# Patient Record
Sex: Male | Born: 1965 | Race: White | Hispanic: No | Marital: Single | Smoking: Current every day smoker
Health system: Southern US, Community
[De-identification: ages and names within clinical notes are randomized; demographics above are authoritative.]

## PROBLEM LIST (undated history)

## (undated) DIAGNOSIS — I2119 ST elevation (STEMI) myocardial infarction involving other coronary artery of inferior wall: Secondary | ICD-10-CM

## (undated) DIAGNOSIS — E785 Hyperlipidemia, unspecified: Secondary | ICD-10-CM

## (undated) DIAGNOSIS — F1490 Cocaine use, unspecified, uncomplicated: Secondary | ICD-10-CM

## (undated) DIAGNOSIS — I2510 Atherosclerotic heart disease of native coronary artery without angina pectoris: Secondary | ICD-10-CM

## (undated) DIAGNOSIS — F419 Anxiety disorder, unspecified: Secondary | ICD-10-CM

## (undated) DIAGNOSIS — F32A Depression, unspecified: Secondary | ICD-10-CM

## (undated) DIAGNOSIS — K219 Gastro-esophageal reflux disease without esophagitis: Secondary | ICD-10-CM

## (undated) DIAGNOSIS — D649 Anemia, unspecified: Secondary | ICD-10-CM

## (undated) DIAGNOSIS — I10 Essential (primary) hypertension: Secondary | ICD-10-CM

## (undated) DIAGNOSIS — F329 Major depressive disorder, single episode, unspecified: Secondary | ICD-10-CM

## (undated) HISTORY — DX: Anemia, unspecified: D64.9

## (undated) HISTORY — DX: Hyperlipidemia, unspecified: E78.5

## (undated) HISTORY — DX: Major depressive disorder, single episode, unspecified: F32.9

## (undated) HISTORY — PX: NOSE SURGERY: SHX723

## (undated) HISTORY — DX: Depression, unspecified: F32.A

## (undated) HISTORY — DX: Essential (primary) hypertension: I10

## (undated) HISTORY — DX: Anxiety disorder, unspecified: F41.9

## (undated) HISTORY — DX: Gastro-esophageal reflux disease without esophagitis: K21.9

## (undated) HISTORY — DX: Cocaine use, unspecified, uncomplicated: F14.90

## (undated) HISTORY — DX: Atherosclerotic heart disease of native coronary artery without angina pectoris: I25.10

## (undated) HISTORY — PX: CORONARY ANGIOPLASTY: SHX604

---

## 1998-09-18 ENCOUNTER — Emergency Department (HOSPITAL_COMMUNITY): Admission: EM | Admit: 1998-09-18 | Discharge: 1998-09-18 | Payer: Self-pay | Admitting: Emergency Medicine

## 2002-05-06 ENCOUNTER — Emergency Department (HOSPITAL_COMMUNITY): Admission: EM | Admit: 2002-05-06 | Discharge: 2002-05-06 | Payer: Self-pay | Admitting: Emergency Medicine

## 2006-11-26 ENCOUNTER — Inpatient Hospital Stay (HOSPITAL_COMMUNITY): Admission: EM | Admit: 2006-11-26 | Discharge: 2006-11-29 | Payer: Self-pay | Admitting: Cardiology

## 2006-11-26 ENCOUNTER — Ambulatory Visit: Payer: Self-pay | Admitting: Internal Medicine

## 2006-11-26 ENCOUNTER — Encounter: Payer: Self-pay | Admitting: Internal Medicine

## 2006-12-10 ENCOUNTER — Ambulatory Visit: Payer: Self-pay | Admitting: Cardiovascular Disease

## 2006-12-15 ENCOUNTER — Inpatient Hospital Stay (HOSPITAL_COMMUNITY): Admission: EM | Admit: 2006-12-15 | Discharge: 2006-12-16 | Payer: Self-pay | Admitting: Cardiology

## 2006-12-15 ENCOUNTER — Ambulatory Visit: Payer: Self-pay | Admitting: Cardiology

## 2007-12-04 ENCOUNTER — Emergency Department (HOSPITAL_COMMUNITY): Admission: EM | Admit: 2007-12-04 | Discharge: 2007-12-04 | Payer: Self-pay | Admitting: Emergency Medicine

## 2007-12-08 ENCOUNTER — Ambulatory Visit (HOSPITAL_COMMUNITY): Admission: RE | Admit: 2007-12-08 | Discharge: 2007-12-08 | Payer: Self-pay | Admitting: Chiropractic Medicine

## 2008-06-11 ENCOUNTER — Emergency Department (HOSPITAL_COMMUNITY): Admission: EM | Admit: 2008-06-11 | Discharge: 2008-06-12 | Payer: Self-pay | Admitting: Emergency Medicine

## 2008-08-01 ENCOUNTER — Emergency Department (HOSPITAL_COMMUNITY): Admission: EM | Admit: 2008-08-01 | Discharge: 2008-08-01 | Payer: Self-pay | Admitting: Emergency Medicine

## 2008-10-10 ENCOUNTER — Emergency Department (HOSPITAL_BASED_OUTPATIENT_CLINIC_OR_DEPARTMENT_OTHER): Admission: EM | Admit: 2008-10-10 | Discharge: 2008-10-10 | Payer: Self-pay | Admitting: Emergency Medicine

## 2010-08-05 DIAGNOSIS — I2119 ST elevation (STEMI) myocardial infarction involving other coronary artery of inferior wall: Secondary | ICD-10-CM

## 2010-08-05 DIAGNOSIS — I2510 Atherosclerotic heart disease of native coronary artery without angina pectoris: Secondary | ICD-10-CM

## 2010-08-05 HISTORY — DX: Atherosclerotic heart disease of native coronary artery without angina pectoris: I25.10

## 2010-08-05 HISTORY — DX: ST elevation (STEMI) myocardial infarction involving other coronary artery of inferior wall: I21.19

## 2010-11-15 LAB — COMPREHENSIVE METABOLIC PANEL
ALT: 37 U/L (ref 0–53)
AST: 36 U/L (ref 0–37)
Albumin: 4.4 g/dL (ref 3.5–5.2)
Alkaline Phosphatase: 109 U/L (ref 39–117)
BUN: 16 mg/dL (ref 6–23)
GFR calc Af Amer: >60 mL/min (ref >60)
Potassium: 3.9 mEq/L (ref 3.5–5.1)
Sodium: 143 mEq/L (ref 135–145)
Total Protein: 7.5 g/dL (ref 6.0–8.3)

## 2010-11-15 LAB — DIFFERENTIAL
Basophils Relative: 1 % (ref 0–1)
Monocytes Absolute: 0.7 10*3/uL (ref 0.1–1.0)
Monocytes Relative: 7 % (ref 3–12)
Neutro Abs: 7.9 10*3/uL — ABNORMAL HIGH (ref 1.7–7.7)

## 2010-11-15 LAB — CBC
Platelets: 225 10*3/uL (ref 150–400)
RDW: 13.1 % (ref 11.5–15.5)

## 2010-11-15 LAB — ETHANOL: Alcohol, Ethyl (B): <10 mg/dL (ref 0–10)

## 2010-12-18 NOTE — H&P (Signed)
NAME:  Brian Ford, Brian Ford NO.:  [id number]   MEDICAL RECORD NO.:  [id number]          PATIENT TYPE:  INP   LOCATION:  2918                         FACILITY:  [facility]   PHYSICIAN:  Vernice Jefferson, MD          DATE OF BIRTH:  [birth year]   DATE OF ADMISSION:  12/15/2006  DATE OF DISCHARGE:                              HISTORY & PHYSICAL   CHIEF COMPLAINT:  Chest pain.   HISTORY OF PRESENT ILLNESS:  Brian Ford is a 45 year old white man with  a recent ST elevation inferior MI on November 26, 2006, status post bare  metal stent to the right coronary artery with preserved ejection  fraction who presents with a 1-day history of left-sided chest pain.  Brian Ford describes his pain as an uncomfortable ball sensation with  pressure over the left chest as well as some discomfort in the left  anterior shoulder.  His pain started at midnight the night prior to  admission and has been present since that time with only minimal relief  after nitroglycerin administration in the ED.  He denies any associated  nausea, vomiting, diaphoresis, or palpitations.  He has not walked or  exerted himself today so he is unaware if this worsens the pain.  Notably, Brian Ford did smoke crack cocaine around 9 p.m. the night  prior to admission.  He awoke around midnight with this chest pain.  The  pain in its nature is qualitatively similar to the pain he had when he  suffered from his MI.  However, the current pain is somewhat less in  intensity.  He is very concerned about the possibility that this may be  related to his heart.   PAST MEDICAL HISTORY:  1. Coronary artery disease    Status post ST elevation MI, inferior on November 26, 2006, status post  bare metal stent placed to the right coronary artery.    Some irregularities in the left anterior descending artery, 40%  obstruction in the marginal    Inferobasal wall hypo/akinesis with an EF of 55-60% with normal right  ventricular function.  1.  Hyperlipidemia  2. Crack cocaine use for greater than 20 years  3. Tobacco abuse ongoing  4. History of mild normocytic anemia.   MEDICATIONS:  1. Plavix 75 mg p.o. daily.  2. Aspirin daily.  3. Nicotine patch.  4. Wellbutrin XL 150 mg daily.  5. Nitroglycerin as needed for chest pain.  6. Simvastatin 80 mg daily.   SOCIAL HISTORY:  Greater than 25-pack-year tobacco use history, ongoing.  Crack cocaine use, greater than 20 years.  The patient is currently  unemployed and lives with his mother near [place].   FAMILY HISTORY:  His father died at unknown age in motor vehicle  accident.  His mother is alive with no history of coronary artery  disease.  He did have one uncle with MI in his 80s.   REVIEW OF SYSTEMS:  GENERAL:  The patient denies any recent illness,  sick contacts, fevers, chills, diaphoresis.  CARDIOVASCULAR:  He does  have left-sided chest  pain and discomfort with radiation to the left  shoulder.  He denies any arm pain, numbness, any jaw pain or numbness.  He denies any known palpitations, heart racing, syncope, dyspnea on  exertion, or shortness of breath.  PULMONARY:  He denies any cough,  shortness of breath, back pain.  He denies any joint pain or swelling.  He denies any peripheral lower extremity edema.  NEUROLOGY:  He denies  any focal areas of weakness, numbness, tingling, imbalance, or  discoordination, or difficulty with speech.   PHYSICAL EXAMINATION:  VITAL SIGNS:  Temperature 97.9, heart rate 74,  blood pressure 107/55, respiratory rate 15, O2 saturations are 94-96% on  room air.  GENERAL:  This is a normal appearing white man in no acute distress.  HEENT:  Eyes; pupils equal, round, and reactive to light.  Extraocular  muscles intact.  Oropharynx revealed no erythema and no exudate.  NECK:  Supple with no JVD and no thyromegaly.  CARDIOVASCULAR:  Regular rate and rhythm with no murmurs, rubs, or  gallops appreciated.  LUNGS:  Clear to auscultation  bilaterally with no wheezes, rhonchi, or  rales.  ABDOMEN:  Active bowel sounds, soft, nontender, and nondistended.  EXTREMITIES:  No cyanosis, clubbing, or edema.  NEUROLOGY:  He is alert and oriented x3 with no focal deficits in  strength or sensation.  He is somewhat anxious.   ADMISSION LABS:  White blood cell count 12.4, hemoglobin 15.1, platelets  432, MCV 83, ANC 8.9, sodium 137, potassium 3.5, chloride 104, bicarb  28, BUN 13, creatinine 1.03, glucose 101.  Point of care cardiac markers  were negative with a normal CK-MB of 3.5, troponin less than 0.05,  myoglobin 65.   Chest x-ray revealed a heart that is normal in size with interstitial  changes consistent with COPD, however, there is no active  cardiopulmonary disease.  Admission EKG from outside hospital reveals a  normal sinus rhythm at a rate of 79, left deviation axis, no signs of  ventricular hypertrophy, normal intervals notable for distinct prominent  Q waves in 2, 3, and aVF that were present on his prior EKG from April  23 and April 24.  He does have some mild persistent elevation in 2, 3,  and aVF which were present in comparison to April 23.  He has T wave  inversions in 2, 3, and aVF which again are present in comparison to  April 24.  It looks like the T wave inversions evolved in lead 2 between  April 23 and April 24.  No Q's in the anterolateral chest leads with  upright T's in lateral and anterolateral leads.   ASSESSMENT:  Brian Ford is a 45 year old white man with a history of  known coronary artery disease status post recent inferior MI on November 26, 2006, who is admitted to outside hospital with 1-day history of left-  sided chest pain with radiation to the left anterior shoulder.   Problem 1.  Chest pain.  Certainly given the patient's history of  coronary artery disease this is concerning for recurrent or ongoing  ischemia.  Notably the patient has been using crack cocaine which  certainly could have  induced vasospasm.  Regardless, we will plan to  admit the patient for at least 24-hour monitoring on telemetry.  We will  also cycle his cardiac markers and repeat and EKG in the morning.  Certainly, should any of his enzymes bump, we will need to reevaluate  him for potential acute  coronary syndrome.  In the interim, we will  continue to treat him with aspirin and Plavix as well as heparin.  Currently he is on nitroglycerin drip that he was transferred from  outside hospital with.  We will anticipate discontinuing this and  treating his chest pain with morphine as well as sublingual  nitroglycerin.  In terms of his substance abuse, I will obtain a UDS so  this can be documented objectively.  In all likelihood, given his normal  set of cardiac enzymes and his recent cocaine use, this is probably  related to vasospasm.  However, should he bump his cardiac enzymes,  certainly I would be concerned for any form of vessel stenosis and/or  stent occlusion.  We will continue to follow him closely overnight.  Clinically he is quite stable currently.  He reports that he is  continuing to take his Plavix and has not missed any days of this which  is reassuring in terms of the patency of his stent.   Problem 2.  Tobacco abuse.  The patient was counseled in regards to this  and certainly encouraged to quit as this is directly related to coronary  artery disease and risk for adverse events.   Problem 3.  Crack cocaine use.  Again I will obtain a UDS to document  this objectively and the patient surprisingly was unaware that there was  a direct relation between crack cocaine use and heart disease.  We  talked to him at length about this tonight.  Hopefully this admission as  well as a previous MI will be a motivation for him to stop completely.  At discharge, he will certainly need referral to narcotics [facility] in  attempt to help him quit.   Problem 4.  Hyperlipidemia.  We will continue his  statin as well as  aspirin.      Antony Contras, M.D.  Electronically Signed      Vernice Jefferson, MD  Electronically Signed    GL/MEDQ  D:  12/15/2006  T:  12/15/2006  Job:  [id number]   cc:   Noralyn Pick. Eden Emms, MD, [facility]  Bruce R. Juanda Chance, MD, [facility]

## 2010-12-18 NOTE — Assessment & Plan Note (Signed)
[facility]                       [place] [facility] OFFICE NOTE   ALDWIN, MICALIZZI                       MRN:          [id number]  DATE:12/10/2006                            DOB:          Sep 10, 1965    [name] is seen today as a new patient by me.  He was in the hospital  from November 26, 2006 to November 29, 2006.  He had an inferior wall MI and  was transferred from [facility].  He was intervened upon by Dr. Charlies Constable for total occlusion of the right coronary artery.  The left  system did not have critical disease.  His EF was preserved at 55%.  He  got a multi-link Vision bare metal stent, mid RCA.   In talking to Treyshawn, he has felt okay since discharge.  He had not  worked for 2 years prior to this.  He has a job interview later this  week.  He has not had any recurrent chest pain, PND or orthopnea.  There  have been no palpitations or syncope.   Unfortunately, he continues to smoke.  He has also had marijuana and  cocaine in the past year.   He had nicotine patches at home.  He still has 14 mg patches.  I  explained to him that if he continued to smoke, he would have another  heart attack.  Initially, the Wellbutrin that he was on made him feel a  little sick; however, he has been tolerant to it.  I counseled him about  his smoking for less than 10 minutes.  He is going to try the Wellbutrin  and try his nicotine patch again.  I will see him back in 8 weeks.  If  he has been unsuccessful with quitting, then we will try to switch him  to Chantix.   REVIEW OF SYSTEMS:  Otherwise negative.   CURRENT MEDICATIONS:  (Per his discharge summary):  1. Plavix 75 a day.  2. Aspirin daily.  3. Nicotine patches.  4. Wellbutrin XL 150 a day.  5. Nitroglycerin p.r.n.  6. Simvastatin 80 mg at night.   PHYSICAL EXAMINATION:  GENERAL:  A middle-aged white male in no  distress.  Mood is appropriate.  VITAL SIGNS:  Weight is 153 pounds.  Blood  pressure is 110/60, pulse 80  and regular.  He is 5 feet, 8 inches.  He is afebrile.  HEENT:  Normal.  NECK:  Remarkable for no JVP elevation, no carotid bruits, and no  thyromegaly.  LUNGS:  Clear.  There is normal diaphragmatic motion.  There is no  wheezing.  CARDIAC:  Normal.  There is no murmur, rub, gallop or click.  PMI is  normal.  ABDOMEN:  Benign.  Bowel sounds are positive.  There is no tenderness.  There is no AAA.  There is no hepatosplenomegaly.  There is no  hepatojugular reflux.  Cath site is well healed with no evidence of  fistula or pseudoaneurysms.  There is no ecchymosis.  EXTREMITIES:  Distal pulses are intact with no edema.  PTs were +2  bilaterally.  There is no lymphadenopathy.  NEUROLOGIC:  Nonfocal.  There is no muscular weakness.   IMPRESSION:  Stable status post inferior wall myocardial infarction with  preserved left ventricular function.  No evidence of RV involvement, and  no recurrent symptoms since discharge.  In regards to his risk factor  modification, he desperately needs to stop smoking.  He will continue  his Wellbutrin and nicotine patches.  I will see him back in 8 weeks to  see how he is doing.   He will continue his current dose of statin drug.  We will recheck a  lipid and liver profile in 3 months.   I encouraged him to continue to follow up with his job interviews.  He  is an Personnel officer by trade.  From a cardiac perspective, this would  actually probably help him.   He lives at home with his mother.  She smokes.  I told him that there  would be no way for him to successfully quit unless she did not smoke  around him or quit with him.  He will talk to her about this.   Overall, his hemodynamics are fine.  He has nitroglycerin at home in  case he needs it.   I will see him back in about 8 weeks to reassess his nicotine  dependence.     Noralyn Pick. Eden Emms, MD, [facility]  Electronically Signed    PCN/MedQ  DD: 12/10/2006  DT:  12/10/2006  Job #: [id number]

## 2010-12-18 NOTE — Discharge Summary (Signed)
NAME:  Brian Ford, Brian Ford NO.:  [id number]   MEDICAL RECORD NO.:  [id number]          PATIENT TYPE:  INP   LOCATION:  2918                         FACILITY:  [facility]   PHYSICIAN:  Maisie Fus C. Wall, MD, FACCDATE OF BIRTH:  1965/12/19   DATE OF ADMISSION:  12/15/2006  DATE OF DISCHARGE:  12/16/2006                               DISCHARGE SUMMARY   DISCHARGE DIAGNOSES:  1. Chest pain, likely cocaine-induced vasospasm, normal cardiac      enzymes and unchanged EKG.  2. Coronary artery disease.      a.     Status post ST elevation myocardial infarction on       April23,2008.      b.     Status post cardiac catheterization with a bare-metal stent       placed in right coronary artery.      c.     Irregularities in the left anterior descending artery, 40%       obstruction in the marginal.      d.     Inferobasal wall hypo to akinesis with an ejection fraction       of 55% to 60% with normal right ventricular function.  3. Hyperlipidemia.  4. Crack cocaine use for greater than 20 years.  5. Tobacco abuse, ongoing.  6. History of a mild normocytic anemia.   DISCHARGE DIAGNOSES:  1. Plavix 75 mg p.o. daily.  2. Aspirin 81 mg p.o. daily.  3. Nicotine patch.  4. Wellbutrin XL 150 mg p.o. daily.  5. Nitroglycerin 0.4 mg sublingual q.5 minutes p.r.n. chest pain.  6. Simvastatin 80 mg p.o. daily.   DISPOSITION/FOLLOWUP:  Brian Ford left the hospital against medical  advice this morning.  The patient became quite upset that we urine drug  screened him and became quite agitated, pulled out his IV as well as  telemetry pads.  There has been no change in his medication regimen and  he has been instructed to continue Plavix, aspirin and statin.  Ideally,  at some point he will follow-up at Dr. Fabio Bering office.  Otherwise, the  risks of leaving against medical advice were explained to the patient.  He is fully understanding of this, and signed an AMA form.   PROCEDURE PERFORMED:   None.   CONSULTATIONS:  None.   BRIEF ADMISSION HISTORY AND PHYSICAL:  Brian Ford is a 45 year old white  man with a recent ST elevation inferior MI in late April2008 who had  a bare-metal stent placed in his right coronary artery, who presents  with 1-day history of left-sided chest pain.  Apparently the patient was  sleeping when he awoke around midnight the night prior to arrival with  pain described as an uncomfortable ball sensation with pressure over  the left chest, as well as some discomfort in left anterior shoulder.  He has only had minimal relief with nitroglycerin administration in ED.  He denies any associated nausea, diaphoresis or palpitations.  He has  not exerted himself, so he is not sure if this makes the pain worse or  not.  Notably, the  patient had smoked crack-cocaine around 9:00 p.m. the  night prior to admission.  He says the pain is qualitatively similar in  nature to his prior MI, however, it is less so in intensity.   PHYSICAL EXAM:  On admission his temperature was 97.9, heart rate 74,  blood pressure 107/55, respiratory rate 15, O2 sats were 94% to 96% on  room air.  GENERAL:  This is a normal-appearing white man in no apparent distress.  EYES:  Pupils equally round, reactive to light.  Extraocular muscles  intact.  OROPHARYNX:  Revealed no erythema.  No exudate.  NECK:  Supple.  No JVD.  No thyromegaly.  CARDIOVASCULAR:  He had a regular rate and rhythm with no murmurs, rubs  or gallops.  PULMONARY:  Clear to auscultation bilaterally.  No wheeze, rhonchi or  rales.  ABDOMEN:  Active bowel sounds, soft, nontender, nondistended.  EXTREMITIES:  No cyanosis, clubbing, edema.  NEURO:  He is alert, oriented x3.  No focal deficits in strength or  sensation.  He is somewhat anxious.   White blood cell count 12.4, hemoglobin 15.1, platelets 432,  MCV of 83,  ANC of 8.9, sodium 137, potassium 3.5, chloride 104, bicarb 28, BUN 13,  creatinine 1.03 and glucose  101.  Point of care cardiac markers were  negative.  Second set was negative as well.  His EKG was reviewed and revealed no significant changes compared to his  prior.  No changes concerning for any acute ischemia.  He does have an  evolving inferior MI with still some concave up ST waves in II, III,  aVF; Qs II, III, AVF.  Admission chest x-ray revealed no active disease.   HOSPITAL COURSE:  1. Chest pain.  The patient was admitted to the CCU step-down unit and      started on IV heparin, and continued Plavix as well as a statin.      He had been transferred from an outside hospital on a nitroglycerin      drip which was titrated off.  His chest pain was treated with IV      morphine.  His cardiac enzymes remained normal at his morning EKG      also remained unchanged.  Again, as mentioned previously, the      patient did endorse recent cocaine use.  His urine drug screen was      positive for cocaine as well as opiates.  The patient became very      frustrated and angered at [place] for urine drug screening him.  I      explained to him that this was just needed for objective      documentation in terms of documenting a possible medical etiology      of his chest pain.  At this point the patient became quite      frustrated.  I witnessed him curse-out a nurse and begin pulling      out his IV and pads.  The risks/benefits of a beta-blocker or      calcium channel-blocker were discussed on morning rounds.  Given      his cocaine use, beta-blocker was essentially ruled out as a      possibility including the theoretical benefits of carvedilol.  In      addition the patient's blood pressure was somewhat soft with      systolics in the mid to high mid-90s, so we actually held off on      any  calcium channel-blocker which theoretically could help with      cocaine-induced vasospasm.  The patient left AMA after having all      the risks explained to him. 2. Polysubstance abuse.  The patient  notably says that he has used      crack-cocaine for close to 2 decades.  The patient was legitimately      surprised to learn that there is a link between cocaine use and      cardiovascular disease.  He reports that he was unaware of this.      We explained in detail to him during his first night of admission      in regards of the risks of serous adverse events including death      from cardiac illness as well as intracerebral processes related to      crack-cocaine use.  He slowly seemed to be taking this in.  In the      future, the patient should certainly warrant referral to [facility]      [facility], or any other outpatient drug rehabilitation or      therapeutic counseling that can be obtained for him.  In addition,      the patient continued to be counseled on smoking cessation.  3. Hyperlipidemia.  The patient was maintained on statin therapy.   DISCHARGE LABS AND VITALS ON THE DAY OF DISCHARGE:  His temperature is  97.9, heart rate of 63, blood pressure of 87/53, respiratory rate of 15,  O2 sats were 96% on room air.  CK 92, CK-MB of 3.6, troponin 0.06.  Urine drug screen positive for cocaine and opiates.      Antony Contras, M.D.  Electronically Signed      Jesse Sans. Daleen Squibb, MD, [facility]  Electronically Signed    GL/MEDQ  D:  12/16/2006  T:  12/16/2006  Job:  [id number]   cc:   Noralyn Pick. Eden Emms, MD, [facility]  Gerrit Friends. Dietrich Pates, MD, [facility]

## 2010-12-21 NOTE — H&P (Signed)
NAME:  Brian Ford, Brian Ford NO.:  [id number]   MEDICAL RECORD NO.:  [id number]          PATIENT TYPE:  OIB   LOCATION:  2861                         FACILITY:  [facility]   PHYSICIAN:  Bruce R. Juanda Chance, MD, FACCDATE OF BIRTH:  May 03, 1966   DATE OF ADMISSION:  11/26/2006  DATE OF DISCHARGE:                              HISTORY & PHYSICAL   PRIMARY CARE PHYSICIAN:  None.   PRIMARY CARDIOLOGIST:  New, currently Dr. Juanda Chance.   CHIEF COMPLAINT:  Chest pain.   HISTORY OF PRESENT ILLNESS:  Brian Ford is a 45 year old male with no  previous history of coronary artery disease.  He was in his usual state  of health yesterday and developed chest pain approximately 4 p.m.  It  was in the middle of his chest.  It was associated with nausea,  diaphoresis, and some shortness of breath.  He tried Advil with partial  relief.  He states that the Advil relieved the pain to the point where  he was able to sleep, but it woke him up at approximately 4 a.m.  It  eased off slightly on its own, but when he woke up again at 7 o'clock  and started moving around, it got worse, and he went to [facility]  Emergency Room.  There, he was evaluated, and the pain was at that time  in his chest and radiating into his left arm, shoulder, and back.  It  was described as dull and a 10/10.  His EKG was concerning for acute  inferior MI, and he was transferred urgently by EMS [facility] to the  cath lab.   On arrival to the cath lab, Brian Ford is complaining of a decreased  level of pain but still calls it a 5/10.  He is currently denying nausea  or shortness of breath and is not diaphoretic.  He states he has never  had any episode of pain like this before.  He denies any other symptoms.   PAST MEDICAL HISTORY:  He denies any history of diabetes, hypertension,  hyperlipidemia, or family history of coronary artery disease.  He admits  to ongoing tobacco use but denies drug or alcohol use.  He has not had  any recent medical care.   SURGICAL HISTORY:  None.   ALLERGIES:  No known drug allergies.   MEDICATIONS:  He takes no prescription drugs and takes Advil p.r.n.   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  He has approximately a 30-pack-  year history of tobacco use but denies alcohol or drug abuse.   FAMILY HISTORY:  His mother is alive at age 62 with no history of  coronary artery disease.  His father died when the patient was 38 years  old, probably in his 30s but no other information is available.  He has  no siblings with coronary artery disease.   REVIEW OF SYSTEMS:  He denies any recent fevers or chills.  He has  occasional arthralgias.  He denies hematemesis, hemoptysis, or melena.  Review of  systems is otherwise negative.   PHYSICAL EXAMINATION:  VITAL SIGNS:  Temperature is 98.3.  Current blood  pressure 106/69, heart rate 65, respiratory rate 24, O2 saturation 100%  on 2 liters.  GENERAL:  He is a well-developed, well-nourished white male in moderate  distress.  HEENT:  His head is normocephalic and atraumatic with extraocular  movements intact.  Sclerae clear.  Nares without discharge.  Oropharynx  without erythema.  NECK:  There is no lymphadenopathy, thyromegaly, bruit, or JVD noted.  CV:  His heart is regular in rate and rhythm with an S1, S2.  No  significant murmur, rub, or gallop is noted.  Distal pulses are 2+ in  all 4 extremities and no femoral bruits are appreciated.  LUNGS:  Essentially clear to auscultation bilaterally.  SKIN:  No rashes or lesions are noted.  ABDOMEN:  Fairly firm but nontender and has active bowel sounds.  EXTREMITIES:  There is no cyanosis, clubbing, or edema noted.  MUSCULOSKELETAL:  He has got no joint deformity or effusions.  NEURO:  He is alert and oriented.  His cranial nerves II-XII grossly  intact.   DATA:  Chest x-ray performed at [facility] is pending.    Laboratory values performed at [facility] show a sodium 135, potassium  3.5, chloride 97, CO2 25, BUN 14, creatinine 1.1, glucose 119,  hemoglobin 14, hematocrit 40.9, WBCs 18.6, platelets 354,000.  EKG is  sinus bradycardia rate 58 beats per minute with ST elevation  approximately 1 mm in the inferior leads.   IMPRESSION:  Acute inferior myocardial infarction:  Brian Ford is being  taken urgently to the cath lab and further evaluation and treatment will  depend on the results of the heart catheterization.  He is being given  aspirin and anticoagulated.  He will be started on empiric Lipitor 80  and a lipid profile will be checked.  He is being anticoagulated with  Plavix as well.  Will start a proton pump inhibitor for possible reflux  symptoms.  We will check a hemoglobin A1c and TSH.      Theodore Demark, PA-C      Bruce R. Juanda Chance, MD, [facility]  Electronically Signed    RB/MEDQ  D:  11/26/2006  T:  11/26/2006  Job:  [id number]

## 2010-12-21 NOTE — Cardiovascular Report (Signed)
NAME:  Brian Ford, Brian Ford NO.:  [id number]   MEDICAL RECORD NO.:  [id number]          PATIENT TYPE:  INP   LOCATION:  2807                         FACILITY:  [facility]   PHYSICIAN:  Bruce R. Juanda Chance, MD, FACCDATE OF BIRTH:  09/09/1965   DATE OF PROCEDURE:  11/26/2006  DATE OF DISCHARGE:                            CARDIAC CATHETERIZATION   PROCEDURE:  Cardiac catheterization and percutaneous coronary  intervention.   CLINICAL HISTORY:  Brian Ford is 45 years old and works as an  Personnel officer.  He has had no prior medical history and he has no primary  care physician.  Yesterday afternoon about 5 he developed chest pain  which was intermittent through the night and became more persistent this  morning, and he came to [facility] emergency room at 8:25 a.m.  His ECG  showed an acute diaphragmatic wall infarction and code STEMI was called  and [facility] brought him to [facility], where he was taken directly  to the catheterization laboratory for intervention.   PROCEDURE:  The procedure was performed via the right femoral artery  using an arterial sheath and 6-French preformed coronary catheters.  A  front wall arterial puncture was performed and Omnipaque contrast was  used.  After we recognized that the patient had total occlusion of the  mid right coronary artery, preparation was made for PCI.  The patient  was given Angiomax bolus and infusion and was given 600 mg of Plavix and  four chewable aspirin.  We crossed the lesion with a Prowater wire  without difficulty, but this did not reestablish flow.  We dilated the  lesion with a 2.25 x 20 mm balloon, but this also did not reestablish  flow.  We then inflated the balloon at about 3 atmospheres for 15  seconds, and this did establish TIMI-2 flow in the distal vessel.  The  patient became hypotensive and bradycardic and required dopamine and IV  fluids.  We then stented the lesion with a 3.0 x 23-mm Vision stent,  deploying this with one inflation of 15 atmospheres for 30 seconds.  We  then postdilated with a 3.5 x 15-mm Quantum Maverick, performing three  inflations up to 18 atmospheres for 30 seconds.  We felt that the stent  was still not completely expanded to the size of the vessel, so we then  went in with a 4.0 x 15-mm Quantum Maverick and performed three  inflations up to 8 atmospheres for 30 seconds trying to catch both edges  of the stent.  Final diagnostic studies were then performed through the  guiding catheter.   The right femoral was closed with an Angio-Seal at the end of the  procedure.  The patient tolerated the procedure well and left the  laboratory in satisfactory position.   RESULTS:  Left main coronary:  The left main coronary artery was free of  significant disease.   Left anterior descending artery:  The left anterior descending artery  gave rise to two diagonal branches and three septal perforators.  The  LAD was irregular but there was no major obstruction.  The circumflex artery:  The circumflex artery gave rise to a first  marginal branch, a second marginal branch and three posterolateral  branches.  There was 40% narrowing in the first marginal branch.   Right coronary artery:  The right coronary artery is a moderately large  vessel that gave rise to a conus branch, a right ventricular branch, and  then was completely occluded at its midportion.  There were faint  collaterals filling the posterior descending branch from the left  coronary artery.   Left ventriculogram:  The left ventriculogram performed in the RAO  projection showed hypokinesis to akinesis of the inferobasal wall.  The  rest of the wall motion was good and thee estimated fraction was 55%.   Following stenting of the lesion in the mid-right coronary artery, the  stenosis improved from 100% to 0% in the flow improved from TIMI-0 to  TIMI-3 flow.   The patient had the onset of chest pain at 5  p.m. on April 22.  He  arrived at the [facility] emergency room at 8:25 on April 23.  First he  arrived at [facility] catheterization lab at 10:07 and the first balloon  inflation was at 10:32.  This gave a door to balloon time from [facility] of 2 hours 7 minutes and a reperfusion time of 17 hours 32  minutes.   CONCLUSION:  1. Acute diaphragmatic wall infarction with total occlusion of the mid-      right coronary artery, irregularities in the left anterior      descending artery, 40% narrowing in the marginal branch of the      circumflex artery and inferobasal wall hypokinesis to akinesis with      an estimated fraction of 55%.  2. Successful reperfusion and stenting of the lesion in the mid-right      coronary with improvement of sentinel narrowing from 100 to 0% and      improvement in the flow from TIMI-0 to TIMI-3 flow using a bare      metal stent.   DISPOSITION:  The patient was returned to the post-angiography unit for  further observation.  Will plan on discharge on day #2 if the patient  remains stable.  Will recommend Plavix for a year.      Bruce Elvera Lennox Juanda Chance, MD, [facility]  Electronically Signed     BRB/MEDQ  D:  11/26/2006  T:  11/26/2006  Job:  [id number]   cc:   Cardiopulmonary Lab

## 2010-12-21 NOTE — Discharge Summary (Signed)
NAME:  Brian Ford, STROLE NO.:  [id number]   MEDICAL RECORD NO.:  [id number]          PATIENT TYPE:  INP   LOCATION:  4704                         FACILITY:  [facility]   PHYSICIAN:  Peter C. Eden Emms, MD, FACCDATE OF BIRTH:  [birth year]   DATE OF ADMISSION:  11/26/2006  DATE OF DISCHARGE:  11/29/2006                               DISCHARGE SUMMARY   CARDIOLOGIST:  He will be new to our [place] office.  His attending  this admission was Dr. Charlies Constable.   PRIMARY CARE PHYSICIAN:  None   REASON FOR ADMISSION:  Chest pain and electrocardiogram evidence of  acute inferior myocardial infarction.   DISCHARGE DIAGNOSES:  1. Coronary artery disease.      a.     Status post acute inferior ST elevation myocardial       infarction this admission with late presentation, treated with a       bare-metal stent to the right coronary artery.      b.     Persistent hypotension post intervention despite good left       ventricular and right ventricular function - resolved prior to       discharge.  2. Preserved left ventricular function with an ejection fraction of      60% and hypokinesis of the inferior septal wall on echocardiogram.      Right ventricular function appeared to be normal.  3. Dyslipidemia.  Therapy initiated this admission.  4. Tobacco abuse.  5. Mild normocytic anemia.   PROCEDURE THIS ADMISSION:  Cardiac catheterization and percutaneous  coronary intervention by Dr. Charlies Constable November 26, 2006.  Please see  the dictated note for complete details.  Briefly, LAD irregular,  circumflex 40% at the obtuse marginal, RCA totally occluded mid vessel,  LV inferobasal hypokinesis to akinesis, EF 55%.  The patient received a  Multi-Link Vision bare-metal stent to the mid RCA reducing stenosis from  100% to 0%.   HISTORY:  [name] is a 45 year old male patient with no previous  coronary disease who presented to the hospital on the day of admission  with complaints of  chest discomfort that developed approximately 4:00  p.m. the day prior to admission.  He eventually went to [facility]  emergency room with 10/10 chest pain.  His EKG was concerning for  inferior myocardial infarction, and he was transferred directly to [facility] cardiac catheterization lab for further evaluation and  treatment.   HOSPITAL COURSE:  As noted above, the patient went directly to the  cardiac catheterization lab.  He was seen by Dr. Juanda Chance.  He underwent  emergent cardiac catheterization and percutaneous coronary intervention  of the RCA.  He did tolerate the procedure well.  However, post  intervention, he developed hypotension that was treated with dopamine as  well as fluid boluses.  His blood pressure remained somewhat tenuous  over the next 48 hours.  His dopamine was eventually weaned off.  He did  have some accelerated idioventricular rhythm.  He had a relook  echocardiogram to make sure he had no RV involvement  with his infarct.  As noted above, his RV function remained normal.  His blood pressure had  finally recovered by November 28, 2006.  It was recommended that he be  placed on Wellbutrin as well as nicotine patch to help him quit smoking.  He was placed on high-dose statin therapy.  No beta-blocker or ACE  inhibitor was initiated given his hypotension.  The patient does have  difficulty with finances.  The Child psychotherapist as well as Freight forwarder have seen the patient.  He will be set up with Plavix  assistance.  He will receive some Plavix from the pharmacy in the  hospital as well as a couple prescriptions for 7 days of free Plavix.  He will also be set up with Lipitor.  At discharge, we have gone ahead  and given him a prescription for simvastatin 80 mg every night to take  the place of his Lipitor, as he can get this for $4 per month at [facility].  This has been explained to the patient.  As soon as his  assistance for Lipitor comes through  and he gets that medication, he can  switch back to the Lipitor.  He will be provided with a nicotine patch  prescription as well.  On the morning of November 29, 2006, he was found to  be in stable condition and ready for discharge to home.   LABORATORY AND ANCILLARY DATA:  At discharge, white count 10,600,  hemoglobin 0.5, hematocrit 34.3, MCV 84.5, platelet count 235,000.  INR  on admission 1.0.  April 25, sodium 140, potassium 3.4, glucose 89, BUN  10, creatinine 0.98, calcium 8.1, total bilirubin 0.7, alkaline  phosphatase 75, AST 260, ALT 75, total protein 5.7, albumin 3.1.  Peak  troponin greater than 100, peak CK-MB was 568.2.  Total cholesterol 179,  triglycerides 121, HDL 38, LDL 117.  TSH 1.3.  Admission chest x-ray:  No acute chest disease.   DISCHARGE MEDICATIONS:  1. Plavix 75 mg daily.  2. Aspirin 325 mg daily.  3. Nicotine patches as directed.  4. Wellbutrin XL 150 mg daily.  5. Nitroglycerin p.r.n. chest pain.  6. Simvastatin 80 mg every night.  As noted above, the patient has      been advised to substitute simvastatin as he should be able to      afford this until the assistance for Lipitor comes through.   DIET:  Low-fat, low-sodium diet.   ACTIVITY:  The patient is to increase his activity slowly.  He should  not return to work until he is seen back in follow up in our office in  [place].  No driving, lifting, exertion or sexual activity for 2  weeks.  He may shower.  He may walk up steps.   WOUND CARE:  He should call our office for any groin swelling, bleeding,  bruising or fever.   FOLLOW UP:  The patient will be set up with follow-up in the [place]  office with one of our physicians there in the next 2 weeks, and the  office will contact with an appointment.  As noted above, he is awaiting  prescriptions from the pharmacy as well as prescription assistance from  the drug companies.   Total physician/P.A. time greater than 30 minutes.      Tereso Newcomer, PA-C      Peter C. Eden Emms, MD, [facility]  Electronically Signed    SW/MEDQ  D:  11/29/2006  T:  11/29/2006  Job:  [phone]

## 2010-12-25 ENCOUNTER — Ambulatory Visit (INDEPENDENT_AMBULATORY_CARE_PROVIDER_SITE_OTHER): Payer: Self-pay | Admitting: Adult Health

## 2010-12-25 ENCOUNTER — Encounter: Payer: Self-pay | Admitting: Adult Health

## 2010-12-25 DIAGNOSIS — E785 Hyperlipidemia, unspecified: Secondary | ICD-10-CM

## 2010-12-25 DIAGNOSIS — I519 Heart disease, unspecified: Secondary | ICD-10-CM

## 2010-12-25 DIAGNOSIS — Z9861 Coronary angioplasty status: Secondary | ICD-10-CM | POA: Insufficient documentation

## 2010-12-25 DIAGNOSIS — I2510 Atherosclerotic heart disease of native coronary artery without angina pectoris: Secondary | ICD-10-CM

## 2010-12-25 DIAGNOSIS — E7800 Pure hypercholesterolemia, unspecified: Secondary | ICD-10-CM

## 2010-12-25 MED ORDER — NITROGLYCERIN 0.4 MG SL SUBL: 0.4000 mg | SUBLINGUAL_TABLET | SUBLINGUAL | Status: DC | PRN

## 2010-12-25 MED ORDER — ASPIRIN EC 81 MG PO TBEC: 81.0000 mg | DELAYED_RELEASE_TABLET | Freq: Every day | ORAL | Status: AC

## 2010-12-25 NOTE — Patient Instructions (Signed)
**Note De-Identified  Obfuscation** Your physician has recommended you make the following change in your medication: start taking Aspirin 81mg  daily  Your physician recommends that you return for lab work in: this week  Please see handout given to you today on a low cholesterol diet  Your physician has requested that you have en exercise stress myoview. For further information please visit [email or web address] Please follow instruction sheet, as given.  Your physician recommends that you schedule a follow-up appointment in:  after tests.

## 2010-12-25 NOTE — Progress Notes (Signed)
HPI: Brian Ford is a 45 y/o male patient of Dr. Eden Emms with known history of CAD with history of Inferior wall MI, he has a BMS to the RCA. He has a history of tobacco abuse and hypercholesterolemia. He has not been seen in the office since May of 2008.He states that he has no insurance and has been seen by the [facility]. Found to have elevated cholesterol and placed on Lipitor,  He was advised to follow-up with cardiology with CAD history.  He unfortunately continues to smoke.  He denies chest pain, but has noticed increased shortness of breath and fatigue. He cannot do anything strenuous (working in the yard) without having to stop after about 30 minutes.  He has not been diagnosed with COPD. He states that he never had chest pain with his inferior MI, just shortness of breath.   He has had no hospitalizations or ER visits since being seen last in 2008.   Allergies not on file  Current Outpatient Prescriptions  Medication Sig Dispense Refill  . aspirin 81 MG EC tablet Take 81 mg by mouth daily.        Marland Kitchen buPROPion (WELLBUTRIN XL) 150 MG 24 hr tablet Take 150 mg by mouth daily.        . clopidogrel (PLAVIX) 75 MG tablet Take 75 mg by mouth daily.        . nicotine (NICODERM CQ - DOSED IN MG/24 HOURS) 14 mg/24hr patch Place 1 patch onto the skin daily.        . nitroGLYCERIN (NITROSTAT) 0.4 MG SL tablet Place 0.4 mg under the tongue every 5 (five) minutes as needed.        . simvastatin (ZOCOR) 80 MG tablet Take 80 mg by mouth at bedtime.          Past Medical History  Diagnosis Date  . Chest pain   . CAD (coronary artery disease)   . Coronary stent occlusion   . Hyperlipidemia   . Crack cocaine use   . Tobacco abuse   . Normocytic anemia     Past Surgical History  Procedure Date  . Coronary angioplasty     ROS: Review of systems complete and found to be negative unless listed above PHYSICAL EXAM  BP  124/82,  P-77, R-20 General: Well developed, well nourished, in no acute  distress Head: Eyes PERRLA, No xanthomas.   Normal cephalic and atramatic  Lungs: Mild bilateral crackles, occasional wheezes, but scant. Heart: HRRR S1 S2, Pulses are 2+ & equal.            No carotid bruit. No JVD.  No abdominal bruits. No femoral bruits. Abdomen: Bowel sounds are positive, abdomen soft and non-tender without masses or                  Hernia's noted. Msk:  Back normal, normal gait. Normal strength and tone for age. Extremities: No clubbing, cyanosis or edema.  DP +1 Neuro: Alert and oriented X 3. Psych:  Good affect, responds appropriately  EKG: NSR with rate of 64 bpm, Inferior Q waves. With anterior changes depressed S-wave  ASSESSMENT AND PLAN

## 2010-12-25 NOTE — Assessment & Plan Note (Signed)
He is experiencing increased work of breathing in the setting of ongoing tobacco abuse. However, he states this is similar to what he had been experiencing with prior MI. EKG shows evidence of anterior changes.  Review of catheterization in 2008 demonstrated 40% Cx disease in the first marginal branch. The LAD had no major obstruction.  Will plan stress myoview to evaluate for progressive CAD or ischemia. He is advised again on smoking cessation. He is to take ASA 81 mg daily. He is given a RX for NTG SL.

## 2010-12-25 NOTE — Assessment & Plan Note (Signed)
I will obtain records from [facility] for recent cholesterol levels.  He will continue on lipitor as directed.  Recheck levels in 3 months along with LFT's

## 2010-12-26 ENCOUNTER — Encounter: Payer: Self-pay | Admitting: Adult Health

## 2010-12-26 ENCOUNTER — Other Ambulatory Visit: Payer: Self-pay | Admitting: Cardiology

## 2010-12-26 DIAGNOSIS — I2510 Atherosclerotic heart disease of native coronary artery without angina pectoris: Secondary | ICD-10-CM

## 2010-12-27 ENCOUNTER — Other Ambulatory Visit: Payer: Self-pay | Admitting: Adult Health

## 2010-12-27 ENCOUNTER — Ambulatory Visit (INDEPENDENT_AMBULATORY_CARE_PROVIDER_SITE_OTHER): Payer: Self-pay | Admitting: *Deleted

## 2010-12-27 ENCOUNTER — Encounter (HOSPITAL_COMMUNITY)
Admission: RE | Admit: 2010-12-27 | Discharge: 2010-12-27 | Disposition: A | Discharge status: Home / Self Care | Payer: Self-pay | Source: Ambulatory Visit | Attending: Cardiology | Admitting: Cardiology

## 2010-12-27 ENCOUNTER — Encounter (HOSPITAL_COMMUNITY): Payer: Self-pay

## 2010-12-27 DIAGNOSIS — E785 Hyperlipidemia, unspecified: Secondary | ICD-10-CM | POA: Insufficient documentation

## 2010-12-27 DIAGNOSIS — I2510 Atherosclerotic heart disease of native coronary artery without angina pectoris: Secondary | ICD-10-CM | POA: Insufficient documentation

## 2010-12-27 DIAGNOSIS — R079 Chest pain, unspecified: Secondary | ICD-10-CM | POA: Insufficient documentation

## 2010-12-27 DIAGNOSIS — F17200 Nicotine dependence, unspecified, uncomplicated: Secondary | ICD-10-CM | POA: Insufficient documentation

## 2010-12-27 MED ORDER — TECHNETIUM TC 99M TETROFOSMIN IV KIT
30.0000 | PACK | Freq: Once | INTRAVENOUS | Status: AC | PRN
  Administered 2010-12-27: 28 via INTRAVENOUS

## 2010-12-27 MED ORDER — TECHNETIUM TC 99M TETROFOSMIN IV KIT
10.0000 | PACK | Freq: Once | INTRAVENOUS | Status: AC | PRN
  Administered 2010-12-27: 10.72 via INTRAVENOUS

## 2010-12-27 NOTE — Progress Notes (Deleted)

## 2010-12-28 LAB — HEPATIC FUNCTION PANEL
Albumin: 4.7 g/dL (ref 3.5–5.2)
Alkaline Phosphatase: 84 U/L (ref 39–117)
Total Bilirubin: 0.4 mg/dL (ref 0.3–1.2)

## 2010-12-28 LAB — LIPID PANEL
HDL: 37 mg/dL — ABNORMAL LOW (ref >39)
LDL Cholesterol: 98 mg/dL (ref 0–99)

## 2011-01-03 ENCOUNTER — Ambulatory Visit (INDEPENDENT_AMBULATORY_CARE_PROVIDER_SITE_OTHER): Payer: Self-pay | Admitting: Otolaryngology

## 2011-01-03 DIAGNOSIS — J343 Hypertrophy of nasal turbinates: Secondary | ICD-10-CM

## 2011-01-03 DIAGNOSIS — J342 Deviated nasal septum: Secondary | ICD-10-CM

## 2011-01-03 DIAGNOSIS — H6120 Impacted cerumen, unspecified ear: Secondary | ICD-10-CM

## 2011-01-14 ENCOUNTER — Encounter: Payer: Self-pay | Admitting: Adult Health

## 2011-01-14 ENCOUNTER — Ambulatory Visit (INDEPENDENT_AMBULATORY_CARE_PROVIDER_SITE_OTHER): Payer: Self-pay | Admitting: Adult Health

## 2011-01-14 DIAGNOSIS — I519 Heart disease, unspecified: Secondary | ICD-10-CM

## 2011-01-14 DIAGNOSIS — I2510 Atherosclerotic heart disease of native coronary artery without angina pectoris: Secondary | ICD-10-CM

## 2011-01-14 DIAGNOSIS — R0602 Shortness of breath: Secondary | ICD-10-CM

## 2011-01-14 DIAGNOSIS — F17200 Nicotine dependence, unspecified, uncomplicated: Secondary | ICD-10-CM

## 2011-01-14 NOTE — Progress Notes (Signed)
HPI: [name] is a 45 y/o male patient of Dr. Eden Emms with known history of CAD with history of Inferior wall MI, he has a BMS to the RCA. He has a history of tobacco abuse and hypercholesterolemia. He has not been seen in the office since May of 2008.He states that he has no insurance and has been seen by the [facility] Dept. Found to have elevated cholesterol and placed on Lipitor,  He was advised to follow-up with cardiology with CAD history.  He unfortunately continues to smoke.  He denies chest pain, but has noticed increased shortness of breath and fatigue. He cannot do anything strenuous (working in the yard) without having to stop after about 30 minutes.  He has not been diagnosed with COPD. He states that he never had chest pain with his inferior MI, just shortness of breath.   He has had no hospitalizations or ER visits since being seen last in 2008.  On last visit I ordered a stress myoview for further evaluation for progression of ischemia with known CAD.  He is here for results.  He is, unfortunately, continuing to smoke.   No Known Allergies  Current Outpatient Prescriptions  Medication Sig Dispense Refill  . aspirin EC 81 MG tablet Take 1 tablet (81 mg total) by mouth daily.  150 tablet  2  . atorvastatin (LIPITOR) 80 MG tablet Take 80 mg by mouth daily.        . nitroGLYCERIN (NITROSTAT) 0.4 MG SL tablet Place 1 tablet (0.4 mg total) under the tongue every 5 (five) minutes as needed.  25 tablet  3    Past Medical History  Diagnosis Date  . Chest pain   . CAD (coronary artery disease)   . Coronary stent occlusion   . Hyperlipidemia   . Crack cocaine use   . Tobacco abuse   . Normocytic anemia     Past Surgical History  Procedure Date  . Coronary angioplasty     ROS: Review of systems complete and found to be negative unless listed above PHYSICAL EXAM  BP  124/82,  P-77, R-20 General: Well developed, well nourished, in no acute distress Head: Eyes PERRLA, No xanthomas.    Normal cephalic and atramatic  Lungs: Mild bilateral crackles, occasional wheezes, but scant. Heart: HRRR S1 S2, Pulses are 2+ & equal.            No carotid bruit. No JVD.  No abdominal bruits. No femoral bruits. Abdomen: Bowel sounds are positive, abdomen soft and non-tender without masses or                  Hernia's noted. Msk:  Back normal, normal gait. Normal strength and tone for age. Extremities: No clubbing, cyanosis or edema.  DP +1 Neuro: Alert and oriented X 3. Psych:  Good affect, responds appropriately  EKG: NSR with rate of 64 bpm, Inferior Q waves. With anterior changes depressed S-wave  ASSESSMENT AND PLAN

## 2011-01-14 NOTE — Patient Instructions (Signed)
**Note De-Identified  Obfuscation** Your physician recommends that you continue on your current medications as directed. Please refer to the Current Medication list given to you today.  You have been referred to Dr. Juanetta [place]  Your physician recommends that you schedule a follow-up appointment in: 6 months

## 2011-01-14 NOTE — Assessment & Plan Note (Signed)
Review of stress myoview demonstrated Modest hypertensive response to exercise, and DOE which did not limit  his exercise. Perfusion imaging shows evidence of dense infeioposterior scar with no large areas of residual ischemia. EF is reduced at 38% with inferior akinesis.  This has been explained to the patient who seems disappointed that nothing more was found.  He states that he cannot work because of his shortness of breath and chest pressure. I have advised him to quit smoking.  He is hoping to get disability for this.  I do not recommend disability from a cardiac standpoint, but have referred him to pulmonologist for further treatment.  He is hoping he can get disability from that practice. I have advised him that smoking is a major factor for his dyspnea and may not be eligible for disability if he continues to smoke with diagnosis for COPD.

## 2011-01-29 ENCOUNTER — Telehealth: Payer: Self-pay | Admitting: Cardiovascular Disease

## 2011-01-29 NOTE — Telephone Encounter (Signed)
Pt calling re being a pt of dr nishan's a few years ago, currently a pt in the  office, was told his heart is only functioning at 38% and has questions that he's not happy with the answers he's getting from [place] office

## 2011-02-07 ENCOUNTER — Encounter: Payer: Self-pay | Admitting: *Deleted

## 2011-02-08 NOTE — Telephone Encounter (Signed)
Spoke with Brian Ford, he is aware dr Eden Emms is going to [place] and encouraged Brian Ford to make appt if he needs more information [name]

## 2011-02-21 ENCOUNTER — Ambulatory Visit (INDEPENDENT_AMBULATORY_CARE_PROVIDER_SITE_OTHER): Payer: Self-pay | Admitting: Otolaryngology

## 2011-02-21 DIAGNOSIS — J343 Hypertrophy of nasal turbinates: Secondary | ICD-10-CM

## 2011-02-21 DIAGNOSIS — J342 Deviated nasal septum: Secondary | ICD-10-CM

## 2011-02-21 DIAGNOSIS — J310 Chronic rhinitis: Secondary | ICD-10-CM

## 2011-03-15 ENCOUNTER — Telehealth: Payer: Self-pay | Admitting: Adult Health

## 2011-03-15 NOTE — Telephone Encounter (Signed)
Patient would like letter stating that is okay for him to work / tg

## 2011-03-18 ENCOUNTER — Encounter: Payer: Self-pay | Admitting: Adult Health

## 2011-05-07 LAB — RAPID URINE DRUG SCREEN, HOSP PERFORMED
Barbiturates: NOT DETECTED
Benzodiazepines: NOT DETECTED
Cocaine: POSITIVE — AB

## 2011-05-10 LAB — RAPID URINE DRUG SCREEN, HOSP PERFORMED
Amphetamines: NOT DETECTED
Barbiturates: NOT DETECTED
Benzodiazepines: NOT DETECTED

## 2011-05-10 LAB — BASIC METABOLIC PANEL
BUN: 14 mg/dL (ref 6–23)
CO2: 27 mEq/L (ref 19–32)
Chloride: 103 mEq/L (ref 96–112)
Creatinine, Ser: 1.21 mg/dL (ref 0.4–1.5)
Glucose, Bld: 94 mg/dL (ref 70–99)

## 2011-05-10 LAB — DIFFERENTIAL
Basophils Absolute: 0 10*3/uL (ref 0.0–0.1)
Eosinophils Absolute: 0.1 10*3/uL (ref 0.0–0.7)
Eosinophils Relative: 1 % (ref 0–5)
Lymphocytes Relative: 23 % (ref 12–46)
Monocytes Absolute: 0.4 10*3/uL (ref 0.1–1.0)

## 2011-05-10 LAB — CBC
MCHC: 33.6 g/dL (ref 30.0–36.0)
MCV: 84.7 fL (ref 78.0–100.0)
Platelets: 293 10*3/uL (ref 150–400)
RDW: 13.2 % (ref 11.5–15.5)

## 2011-05-10 LAB — ETHANOL: Alcohol, Ethyl (B): <5 mg/dL (ref 0–10)

## 2013-10-01 ENCOUNTER — Encounter (HOSPITAL_COMMUNITY): Payer: Self-pay | Admitting: Emergency Medicine

## 2013-10-01 ENCOUNTER — Emergency Department (HOSPITAL_COMMUNITY)
Admission: EM | Admit: 2013-10-01 | Discharge: 2013-10-01 | Discharge status: Against Medical Advice | Payer: Self-pay | Attending: Emergency Medicine | Admitting: Emergency Medicine

## 2013-10-01 DIAGNOSIS — F329 Major depressive disorder, single episode, unspecified: Secondary | ICD-10-CM | POA: Insufficient documentation

## 2013-10-01 DIAGNOSIS — F3289 Other specified depressive episodes: Secondary | ICD-10-CM | POA: Insufficient documentation

## 2013-10-01 DIAGNOSIS — I2510 Atherosclerotic heart disease of native coronary artery without angina pectoris: Secondary | ICD-10-CM | POA: Insufficient documentation

## 2013-10-01 DIAGNOSIS — F17200 Nicotine dependence, unspecified, uncomplicated: Secondary | ICD-10-CM | POA: Insufficient documentation

## 2013-10-01 NOTE — ED Notes (Signed)
Called 2x for lab work - no answer.  

## 2013-10-01 NOTE — ED Notes (Signed)
Per pt, states he has been depressed for a long time-does not take meds

## 2013-10-01 NOTE — ED Notes (Signed)
Pt called for room placement (third attempt). No response from lobby.

## 2014-05-19 ENCOUNTER — Encounter (HOSPITAL_COMMUNITY)
Admission: EM | Disposition: A | Discharge status: Home / Self Care | Payer: Self-pay | Source: Home / Self Care | Attending: Cardiology

## 2014-05-19 ENCOUNTER — Inpatient Hospital Stay (HOSPITAL_COMMUNITY)
Admission: EM | Admit: 2014-05-19 | Discharge: 2014-05-20 | DRG: 249 | Disposition: A | Discharge status: Home / Self Care | Payer: Self-pay | Attending: Cardiology | Admitting: Cardiology

## 2014-05-19 ENCOUNTER — Encounter (HOSPITAL_COMMUNITY): Payer: Self-pay | Admitting: Emergency Medicine

## 2014-05-19 ENCOUNTER — Ambulatory Visit (HOSPITAL_COMMUNITY): Payer: Self-pay

## 2014-05-19 DIAGNOSIS — I2582 Chronic total occlusion of coronary artery: Secondary | ICD-10-CM | POA: Diagnosis present

## 2014-05-19 DIAGNOSIS — Z955 Presence of coronary angioplasty implant and graft: Secondary | ICD-10-CM

## 2014-05-19 DIAGNOSIS — T82858A Stenosis of vascular prosthetic devices, implants and grafts, initial encounter: Secondary | ICD-10-CM | POA: Diagnosis present

## 2014-05-19 DIAGNOSIS — Z720 Tobacco use: Secondary | ICD-10-CM

## 2014-05-19 DIAGNOSIS — K2970 Gastritis, unspecified, without bleeding: Secondary | ICD-10-CM | POA: Diagnosis present

## 2014-05-19 DIAGNOSIS — I219 Acute myocardial infarction, unspecified: Secondary | ICD-10-CM

## 2014-05-19 DIAGNOSIS — R079 Chest pain, unspecified: Secondary | ICD-10-CM

## 2014-05-19 DIAGNOSIS — E7800 Pure hypercholesterolemia, unspecified: Secondary | ICD-10-CM

## 2014-05-19 DIAGNOSIS — F17210 Nicotine dependence, cigarettes, uncomplicated: Secondary | ICD-10-CM | POA: Diagnosis present

## 2014-05-19 DIAGNOSIS — I25119 Atherosclerotic heart disease of native coronary artery with unspecified angina pectoris: Secondary | ICD-10-CM | POA: Diagnosis present

## 2014-05-19 DIAGNOSIS — E785 Hyperlipidemia, unspecified: Secondary | ICD-10-CM | POA: Diagnosis present

## 2014-05-19 DIAGNOSIS — I2102 ST elevation (STEMI) myocardial infarction involving left anterior descending coronary artery: Secondary | ICD-10-CM | POA: Insufficient documentation

## 2014-05-19 DIAGNOSIS — I214 Non-ST elevation (NSTEMI) myocardial infarction: Principal | ICD-10-CM | POA: Diagnosis present

## 2014-05-19 DIAGNOSIS — I249 Acute ischemic heart disease, unspecified: Secondary | ICD-10-CM

## 2014-05-19 DIAGNOSIS — I252 Old myocardial infarction: Secondary | ICD-10-CM

## 2014-05-19 DIAGNOSIS — D649 Anemia, unspecified: Secondary | ICD-10-CM | POA: Diagnosis present

## 2014-05-19 DIAGNOSIS — R0789 Other chest pain: Secondary | ICD-10-CM

## 2014-05-19 DIAGNOSIS — Y838 Other surgical procedures as the cause of abnormal reaction of the patient, or of later complication, without mention of misadventure at the time of the procedure: Secondary | ICD-10-CM | POA: Diagnosis present

## 2014-05-19 DIAGNOSIS — I255 Ischemic cardiomyopathy: Secondary | ICD-10-CM | POA: Diagnosis present

## 2014-05-19 DIAGNOSIS — I2510 Atherosclerotic heart disease of native coronary artery without angina pectoris: Secondary | ICD-10-CM

## 2014-05-19 DIAGNOSIS — I25110 Atherosclerotic heart disease of native coronary artery with unstable angina pectoris: Secondary | ICD-10-CM

## 2014-05-19 DIAGNOSIS — Z9861 Coronary angioplasty status: Secondary | ICD-10-CM

## 2014-05-19 DIAGNOSIS — F1410 Cocaine abuse, uncomplicated: Secondary | ICD-10-CM

## 2014-05-19 HISTORY — DX: ST elevation (STEMI) myocardial infarction involving other coronary artery of inferior wall: I21.19

## 2014-05-19 HISTORY — PX: LEFT HEART CATHETERIZATION WITH CORONARY ANGIOGRAM: SHX5451

## 2014-05-19 HISTORY — PX: PERCUTANEOUS CORONARY STENT INTERVENTION (PCI-S): SHX5485

## 2014-05-19 LAB — HEMOGLOBIN A1C
Hgb A1c MFr Bld: 5.6 % (ref <5.7)
MEAN PLASMA GLUCOSE: 114 mg/dL (ref <117)

## 2014-05-19 LAB — COMPREHENSIVE METABOLIC PANEL
ALT: 19 U/L (ref 0–53)
AST: 32 U/L (ref 0–37)
Albumin: 3.8 g/dL (ref 3.5–5.2)
Alkaline Phosphatase: 89 U/L (ref 39–117)
Anion gap: 13 (ref 5–15)
BUN: 9 mg/dL (ref 6–23)
CALCIUM: 9.2 mg/dL (ref 8.4–10.5)
CO2: 25 meq/L (ref 19–32)
CREATININE: 1.11 mg/dL (ref 0.50–1.35)
Chloride: 103 mEq/L (ref 96–112)
GFR calc Af Amer: 90 mL/min — ABNORMAL LOW (ref >90)
GFR calc non Af Amer: 77 mL/min — ABNORMAL LOW (ref >90)
Glucose, Bld: 118 mg/dL — ABNORMAL HIGH (ref 70–99)
Potassium: 3.6 mEq/L — ABNORMAL LOW (ref 3.7–5.3)
SODIUM: 141 meq/L (ref 137–147)
TOTAL PROTEIN: 7.5 g/dL (ref 6.0–8.3)
Total Bilirubin: 0.3 mg/dL (ref 0.3–1.2)

## 2014-05-19 LAB — I-STAT CHEM 8, ED
BUN: 10 mg/dL (ref 6–23)
CALCIUM ION: 1.17 mmol/L (ref 1.12–1.23)
Chloride: 106 mEq/L (ref 96–112)
Creatinine, Ser: 1.1 mg/dL (ref 0.50–1.35)
GLUCOSE: 127 mg/dL — AB (ref 70–99)
HCT: 45 % (ref 39.0–52.0)
Hemoglobin: 15.3 g/dL (ref 13.0–17.0)
Potassium: 3.4 mEq/L — ABNORMAL LOW (ref 3.7–5.3)
Sodium: 140 mEq/L (ref 137–147)
TCO2: 23 mmol/L (ref 0–100)

## 2014-05-19 LAB — RAPID URINE DRUG SCREEN, HOSP PERFORMED
AMPHETAMINES: NOT DETECTED
BENZODIAZEPINES: POSITIVE — AB
Barbiturates: NOT DETECTED
COCAINE: POSITIVE — AB
OPIATES: POSITIVE — AB
TETRAHYDROCANNABINOL: NOT DETECTED

## 2014-05-19 LAB — I-STAT TROPONIN, ED: Troponin i, poc: 0.17 ng/mL (ref 0.00–0.08)

## 2014-05-19 LAB — POCT ACTIVATED CLOTTING TIME: Activated Clotting Time: 422 seconds

## 2014-05-19 LAB — MRSA PCR SCREENING: MRSA by PCR: NEGATIVE

## 2014-05-19 LAB — TSH: TSH: 2.94 u[IU]/mL (ref 0.350–4.500)

## 2014-05-19 LAB — MAGNESIUM: Magnesium: 2.1 mg/dL (ref 1.5–2.5)

## 2014-05-19 LAB — TROPONIN I
TROPONIN I: 6.29 ng/mL — AB (ref <0.30)
Troponin I: 13.09 ng/mL (ref <0.30)

## 2014-05-19 SURGERY — LEFT HEART CATHETERIZATION WITH CORONARY ANGIOGRAM
Anesthesia: LOCAL

## 2014-05-19 MED ORDER — NITROGLYCERIN 1 MG/10 ML FOR IR/CATH LAB
INTRA_ARTERIAL | Status: AC
  Filled 2014-05-19: qty 10

## 2014-05-19 MED ORDER — LORAZEPAM 2 MG/ML IJ SOLN
1.0000 mg | Freq: Four times a day (QID) | INTRAMUSCULAR | Status: DC | PRN
  Administered 2014-05-19: 1 mg via INTRAVENOUS

## 2014-05-19 MED ORDER — VERAPAMIL HCL 2.5 MG/ML IV SOLN
INTRAVENOUS | Status: AC
  Filled 2014-05-19: qty 2

## 2014-05-19 MED ORDER — NITROGLYCERIN 0.4 MG SL SUBL: 0.4000 mg | SUBLINGUAL_TABLET | SUBLINGUAL | Status: DC | PRN

## 2014-05-19 MED ORDER — PRASUGREL HCL 10 MG PO TABS
ORAL_TABLET | ORAL | Status: AC
  Filled 2014-05-19: qty 6

## 2014-05-19 MED ORDER — MIDAZOLAM HCL 2 MG/2ML IJ SOLN
INTRAMUSCULAR | Status: AC
  Filled 2014-05-19: qty 2

## 2014-05-19 MED ORDER — HEPARIN (PORCINE) IN NACL 2-0.9 UNIT/ML-% IJ SOLN
INTRAMUSCULAR | Status: AC
  Filled 2014-05-19: qty 1500

## 2014-05-19 MED ORDER — LORAZEPAM 2 MG/ML IJ SOLN: 0.0000 mg | Freq: Four times a day (QID) | INTRAMUSCULAR | Status: DC

## 2014-05-19 MED ORDER — ADULT MULTIVITAMIN W/MINERALS CH
1.0000 | ORAL_TABLET | Freq: Every day | ORAL | Status: DC
  Administered 2014-05-19: 1 via ORAL
  Filled 2014-05-19 (×2): qty 1

## 2014-05-19 MED ORDER — LORAZEPAM 2 MG/ML IJ SOLN: 0.0000 mg | Freq: Two times a day (BID) | INTRAMUSCULAR | Status: DC

## 2014-05-19 MED ORDER — ASPIRIN 81 MG PO CHEW: 324.0000 mg | CHEWABLE_TABLET | ORAL | Status: DC

## 2014-05-19 MED ORDER — LORAZEPAM 1 MG PO TABS
1.0000 mg | ORAL_TABLET | Freq: Four times a day (QID) | ORAL | Status: DC | PRN
Start: 2014-05-19 — End: 2014-05-20
  Administered 2014-05-19 – 2014-05-20 (×3): 1 mg via ORAL
  Filled 2014-05-19 (×3): qty 1

## 2014-05-19 MED ORDER — ASPIRIN EC 325 MG PO TBEC
325.0000 mg | DELAYED_RELEASE_TABLET | Freq: Once | ORAL | Status: AC
  Administered 2014-05-19: 325 mg via ORAL
  Filled 2014-05-19: qty 1

## 2014-05-19 MED ORDER — THIAMINE HCL 100 MG/ML IJ SOLN
100.0000 mg | Freq: Every day | INTRAMUSCULAR | Status: DC
  Filled 2014-05-19 (×2): qty 1

## 2014-05-19 MED ORDER — HEPARIN SODIUM (PORCINE) 1000 UNIT/ML IJ SOLN
INTRAMUSCULAR | Status: AC
  Filled 2014-05-19: qty 1

## 2014-05-19 MED ORDER — DIAZEPAM 5 MG PO TABS: 5.0000 mg | ORAL_TABLET | Freq: Three times a day (TID) | ORAL | Status: DC | PRN

## 2014-05-19 MED ORDER — SODIUM CHLORIDE 0.9 % IJ SOLN: 3.0000 mL | INTRAMUSCULAR | Status: DC | PRN

## 2014-05-19 MED ORDER — ONDANSETRON HCL 4 MG/2ML IJ SOLN: 4.0000 mg | Freq: Four times a day (QID) | INTRAMUSCULAR | Status: DC | PRN

## 2014-05-19 MED ORDER — ACETAMINOPHEN 325 MG PO TABS: 650.0000 mg | ORAL_TABLET | ORAL | Status: DC | PRN

## 2014-05-19 MED ORDER — BIVALIRUDIN 250 MG IV SOLR
INTRAVENOUS | Status: AC
  Filled 2014-05-19: qty 250

## 2014-05-19 MED ORDER — MORPHINE SULFATE 2 MG/ML IJ SOLN
2.0000 mg | Freq: Once | INTRAMUSCULAR | Status: AC
Start: 2014-05-19 — End: 2014-05-19
  Administered 2014-05-19: 2 mg via INTRAVENOUS
  Filled 2014-05-19: qty 1

## 2014-05-19 MED ORDER — NITROGLYCERIN 0.4 MG SL SUBL
0.4000 mg | SUBLINGUAL_TABLET | SUBLINGUAL | Status: AC | PRN
  Administered 2014-05-19: 0.4 mg via SUBLINGUAL
  Filled 2014-05-19: qty 1

## 2014-05-19 MED ORDER — FENTANYL CITRATE 0.05 MG/ML IJ SOLN
INTRAMUSCULAR | Status: AC
  Filled 2014-05-19: qty 2

## 2014-05-19 MED ORDER — LORAZEPAM 2 MG/ML IJ SOLN
INTRAMUSCULAR | Status: AC
  Filled 2014-05-19: qty 1

## 2014-05-19 MED ORDER — ASPIRIN EC 81 MG PO TBEC: 81.0000 mg | DELAYED_RELEASE_TABLET | Freq: Every day | ORAL | Status: DC

## 2014-05-19 MED ORDER — VITAMIN B-1 100 MG PO TABS
100.0000 mg | ORAL_TABLET | Freq: Every day | ORAL | Status: DC
  Administered 2014-05-19: 100 mg via ORAL
  Filled 2014-05-19 (×2): qty 1

## 2014-05-19 MED ORDER — MORPHINE SULFATE 2 MG/ML IJ SOLN: 2.0000 mg | INTRAMUSCULAR | Status: DC | PRN

## 2014-05-19 MED ORDER — ACETAMINOPHEN 325 MG PO TABS
650.0000 mg | ORAL_TABLET | ORAL | Status: DC | PRN
  Administered 2014-05-19: 650 mg via ORAL
  Filled 2014-05-19: qty 2

## 2014-05-19 MED ORDER — ASPIRIN 81 MG PO CHEW: 81.0000 mg | CHEWABLE_TABLET | Freq: Every day | ORAL | Status: DC

## 2014-05-19 MED ORDER — LIDOCAINE HCL (PF) 1 % IJ SOLN
INTRAMUSCULAR | Status: AC
  Filled 2014-05-19: qty 30

## 2014-05-19 MED ORDER — SODIUM CHLORIDE 0.9 % IV SOLN: 1.0000 mL/kg/h | INTRAVENOUS | Status: AC

## 2014-05-19 MED ORDER — PANTOPRAZOLE SODIUM 40 MG PO TBEC
40.0000 mg | DELAYED_RELEASE_TABLET | Freq: Every day | ORAL | Status: DC
  Administered 2014-05-19: 40 mg via ORAL
  Filled 2014-05-19: qty 1

## 2014-05-19 MED ORDER — SODIUM CHLORIDE 0.9 % IJ SOLN: 3.0000 mL | Freq: Two times a day (BID) | INTRAMUSCULAR | Status: DC

## 2014-05-19 MED ORDER — SODIUM CHLORIDE 0.9 % IV SOLN: 250.0000 mL | INTRAVENOUS | Status: DC | PRN

## 2014-05-19 MED ORDER — ATORVASTATIN CALCIUM 80 MG PO TABS
80.0000 mg | ORAL_TABLET | Freq: Every day | ORAL | Status: DC
  Administered 2014-05-19: 80 mg via ORAL
  Filled 2014-05-19 (×3): qty 1

## 2014-05-19 MED ORDER — NICOTINE 21 MG/24HR TD PT24
21.0000 mg | MEDICATED_PATCH | Freq: Every day | TRANSDERMAL | Status: DC
  Administered 2014-05-19: 21 mg via TRANSDERMAL
  Filled 2014-05-19 (×2): qty 1

## 2014-05-19 MED ORDER — PRASUGREL HCL 10 MG PO TABS
10.0000 mg | ORAL_TABLET | Freq: Every day | ORAL | Status: DC
  Filled 2014-05-19: qty 1

## 2014-05-19 MED ORDER — ASPIRIN 300 MG RE SUPP: 300.0000 mg | RECTAL | Status: DC

## 2014-05-19 MED ORDER — FOLIC ACID 1 MG PO TABS
1.0000 mg | ORAL_TABLET | Freq: Every day | ORAL | Status: DC
  Administered 2014-05-19: 1 mg via ORAL
  Filled 2014-05-19 (×2): qty 1

## 2014-05-19 NOTE — ED Notes (Signed)
Radiolucent pads placed on pt.

## 2014-05-19 NOTE — ED Notes (Addendum)
Per [employer], pt from home for chest pain all over. EMS states he was c/o generalized cp. Also reports having bilateral wrist and arm pain. 20g to LFA with 200 ml NS infused. EMS was concerned about elevation in V3 and V4. Pt took two 81 mg ASA at home. Was given 10mg  Morhpine and 2 NTG. Pt reports doing cocaine yesterday and then the pain started.

## 2014-05-19 NOTE — H&P (Signed)
Admit date: 05/19/2014 Referring Physician: Dr. Hyacinth Meeker Primary Cardiologist:  Dr. Diona Browner Chief complaint/reason for admission:  Chest pain  HPI: This is a 48yo WM with a history of ASCAD with remote STEMI in 2008 with an occluded mid RCA s/p PCI, dyslipidemia, crack cocaine use, tobacco use and anemia, who presents to the ER with complaints of CP.  He states that yesterday, while driving, he developed chest pain that was both sharp and pressure, identical to his prior pain with his MI and lasted about 20 minutes and resolved.  There was radiation of the pain to both arms.  This occurred after using cocaine.  He had another episode yesterday again lasting about 20 minutes.  This am he awakened with severe CP radiating down both arms.  It was 10/10 and he called EMS.  He was given 2 SL NTG and Morphine 10mg  in route by EMS with improvement of CP but now pain has worsened again.  The patient is writhing in the bed.  The last time he did cocaine was yesterday.  He says that he does cocaine "all the time" and this pain is not from cocaine.  He also had been drinking ETOH yesterday as well.  In ER EKG shows some new T wave changes in the lateral precordial leads which are new from old EKG.  He continues to have severe CP and is very agitated laying on the bed and also combative.    PMH:    Past Medical History  Diagnosis Date  . Chest pain   . CAD (coronary artery disease)   . Coronary stent occlusion   . Hyperlipidemia   . Crack cocaine use   . Tobacco abuse   . Normocytic anemia     PSH:    Past Surgical History  Procedure Laterality Date  . Coronary angioplasty      ALLERGIES:   Review of patient's allergies indicates no known allergies.  Prior to Admit Meds:   (Not in a hospital admission) Family HX:   No family history on file. Social HX:    History   Social History  . Marital Status: Single    Spouse Name: N/A    Number of Children: N/A  . Years of Education: N/A    Occupational History  . unemployed    Social History Main Topics  . Smoking status: Current Every Day Smoker -- 1.00 packs/day for 25 years    Types: Cigarettes  . Smokeless tobacco: Never Used  . Alcohol Use: No  . Drug Use: No  . Sexual Activity: Not on file   Other Topics Concern  . Not on file   Social History Narrative  . No narrative on file     ROS:  All 11 ROS were addressed and are negative except what is stated in the HPI  PHYSICAL EXAM Filed Vitals:   05/19/14 0845  BP: 129/89  Temp: 97.8 F (36.6 C)  Resp: 20   General: Well developed, well nourished, in no acute distress Head: Eyes PERRLA, No xanthomas.   Normal cephalic and atramatic  Lungs:   Clear bilaterally to auscultation and percussion. Heart:   HRRR S1 S2 Pulses are 2+ & equal.            No carotid bruit. No JVD.  No abdominal bruits. No femoral bruits. Abdomen: Bowel sounds are positive, abdomen soft and non-tender without masses Extremities:   No clubbing, cyanosis or edema.  DP +1 Neuro: Alert and oriented X  3. Psych:  Good affect, responds appropriately   Labs:   Lab Results  Component Value Date   WBC  Value: 10.1 CORRECTED ON 03/08 AT 1423: PREVIOUSLY REPORTED AS 10.2 10/10/2008   HGB 15.3 05/19/2014   HCT 45.0 05/19/2014   MCV  Value: 85.3 CORRECTED ON 03/08 AT 1423: PREVIOUSLY REPORTED AS 84.8 10/10/2008   PLT  Value: 225 CORRECTED ON 03/08 AT 1423: PREVIOUSLY REPORTED AS 227 10/10/2008    Recent Labs Lab 05/19/14 0849  NA 140  K 3.4*  CL 106  BUN 10  CREATININE 1.10  GLUCOSE 127*   No results found for this basename: CKTOTAL, CKMB, CKMBINDEX, TROPONINI   No results found for this basename: PTT   No results found for this basename: INR, PROTIME     Lab Results  Component Value Date   CHOL 159 12/25/2010   Lab Results  Component Value Date   HDL 37* 12/25/2010   Lab Results  Component Value Date   LDLCALC 98 12/25/2010   Lab Results  Component Value Date   TRIG  118 12/25/2010   Lab Results  Component Value Date   CHOLHDL 4.3 12/25/2010   No results found for this basename: LDLDIRECT      Radiology:  No results found.  EKG:  NSR with inferior infarct, nonspecific IVCD and new T wave changes in the lateral precordial leads  ASSESSMENT:  1.  Acute coronary syndrome with new T wave changes in the precordial leads laterally.  Initially NTG helped but now with severe CP identical to prior MI.  He last had cocaine yesterday.  He has a history of MI in the past but has not seen a Cardiologist since then.  He is not on any ASA or statin. 2.  History of remote inferior STEMI with PCI BMS of the RCA for total occlusion 2008 3.  Moderate LV dysfunction with EF 38% with inferior AK and no ischemia by stress myoview 2012 but normal by echo in 2008 4.  Gastritis on Prilosec 5.  Dyslipidemia 6.  Normocytic anemia 7.  Polysubstance abuse with cocaine, ETOH and tobacco  PLAN:   1.  Admit to tele bed 2.  Urgent cardiac cath for ongoing CP despite nitrates with T wave changes on EKG in setting of recent cocaine use 3.  No beta blocker due to recent cocaine 4.  Continue ASA/statin 5.  2D echo to assess LVF 6.  Check FLP in am  Cardiac catheterization was discussed with the patient fully including risks on myocardial infarction, death, stroke, bleeding, arrhythmia, dye allergy, renal insufficiency or bleeding.  All patient questions and concerns were discussed and the patient understands and is willing to proceed.     Quintella ReichertURNER,Cortez Flippen R, MD  05/19/2014  9:04 AM

## 2014-05-19 NOTE — ED Notes (Signed)
Pt yelling out loud because he wants some water to swish and spit. This nurse informed pt someone was getting him some and calmed down some.

## 2014-05-19 NOTE — ED Notes (Signed)
Pt being verbally aggressive towards the physicians in the room.

## 2014-05-19 NOTE — Progress Notes (Addendum)
Patient refused further medical attention. MD was notified and has met with the patient and patient still refused care. Patient signed AMA paper and collected all of his belongings. Patient was escorted off the unit by hospital security.    After discussion with his friend & ride - he has agreed to stay the night with plan AM d/c if stable.  Will see him in the morning and plan discharge if stable.  Leonie Man, M.D., M.S. Interventional Cardiologist   Pager # [phone]

## 2014-05-19 NOTE — Progress Notes (Signed)
[place] RN received report from [employer]. Pt appears to be resting calmly in bed. Will continue to monitor.

## 2014-05-19 NOTE — Progress Notes (Signed)
Patient insists that he needs to leave hospital floor to smoke. Nicotine patch order was received and patch has been placed. Patient states patch is not working. Education about the importance of keeping his wrist straight was again gone over. Education was also provided about the importance of staying at the hospital so that his heart may be monitored after his MI. Patient refuses all care at this time.

## 2014-05-19 NOTE — ED Notes (Signed)
Portable chest xray being completed.  

## 2014-05-19 NOTE — Care Management Note (Addendum)
    Page 1 of 1   05/19/2014     3:12:35 PM CARE MANAGEMENT NOTE 05/19/2014  Patient:  [name],[name]   Account Number:  [id number]  Date Initiated:  05/19/2014  Documentation initiated by:  Junius Creamer  Subjective/Objective Assessment:   adm w mi     Action/Plan:   lives w fam   Anticipated DC Date:     Anticipated DC Plan:  HOME/SELF CARE      DC Planning Services  CM consult  Medication Assistance  [facility]      Choice offered to / List presented to:             Status of service:   Medicare Important Message given?   (If response is "NO", the following Medicare IM given date fields will be blank) Date Medicare IM given:   Medicare IM given by:   Date Additional Medicare IM given:   Additional Medicare IM given by:    Discharge Disposition:    Per UR Regulation:  Reviewed for med. necessity/level of care/duration of stay  If discussed at Long Length of Stay Meetings, dates discussed:    Comments:  10/15 1418 Brian Ford Brian Walgren rn,bsn gave pt 30day free effient card. left pt assist form on shadow chart. pt states no ins at present. left pt inform on [facility] and brochure on [place] [facility].

## 2014-05-19 NOTE — ED Provider Notes (Signed)
I saw and evaluated the patient, reviewed the resident's note and I agree with the findings and plan.  Please see my separate note regarding my evaluation of the patient.    Vida Roller, MD 05/19/14 [phone]

## 2014-05-19 NOTE — Progress Notes (Signed)
Patient agitated, anxious, and  not compliant with instructions to keep right wrist straight. States that he needs to leave to smoke. MD and PA notified. New orders obtained.

## 2014-05-19 NOTE — Progress Notes (Signed)
Patient refuses to stay on EKG monitor, have blood pressure taken, have pulse monitored via thumb.

## 2014-05-19 NOTE — Progress Notes (Signed)
Patient returned to stepdown unit after further conversation with MD. Patient agreed to spend one night in hospital and receive care overnight. Patient stated "I smoked a cigarette outside", and smelled of smoke as assessments were performed. Patients right radial site was clean, dry, and intact. No hematoma, or ecchymosis were noted.

## 2014-05-19 NOTE — Progress Notes (Signed)
Angiomax infusion completed and discontinued. Report to relief:Ernest Louanne Skye, RN

## 2014-05-19 NOTE — ED Provider Notes (Addendum)
Pt has hx of CAD s/p stenting in 2008 (cath report shows RCA occlusion).  He reluctantly endorses cocaine use last night and has been drinkgin ETOH per EMS.  He was initially aggressive with EMS - transported to the hospital with possible STEMI - activated in the field.  He has ongoing CP and states he has pain in the arms, legs and chest.    On exam he has normal rate, no edema, no lung findings, no murmurs.  I have seen the ECG and there are q waves in the inferior leads, a slightly widened QRS and on the EMS tracing there was ST elevation in V2 and V3  ECG in the ED shows no elevations in V2 and V3 though his pain improved significantly with meds pta.   EKG Interpretation  Date/Time:  Thursday May 19 2014 08:42:26 EDT Ventricular Rate:  58 PR Interval:  142 QRS Duration: 116 QT Interval:  484 QTC Calculation: 475 R Axis:   62 Text Interpretation:  Sinus rhythm Nonspecific intraventricular conduction delay Inferior infarct, age indeterminate Since last tracing T wave abnormality improved but QRS has widened Abnormal ekg Confirmed by Hyacinth Meeker  MD, Makell Drohan ([id number]) on 05/19/2014 8:48:04 AM       D/w Dr. Mayford Knife who is taking pt to the cath lab - time in department < 30 minutes prior to going up to the Cath lab however on arrival, the cath lab was in use and not available for a brief time  He was then brought back to the ED where I continued to monitor his hemodynamics and reevaluated the patient several times.  He was in the department for more than 30 minutes prior to his definitive admission.  The time in the department was spent with constant cardiac monitoring, administration of oxygen, medications, cardiology consultation, evaluation of labs and imaging.  CRITICAL CARE Performed by: Vida Roller Total critical care time: 35 Critical care time was exclusive of separately billable procedures and treating other patients. Critical care was necessary to treat or prevent imminent or  life-threatening deterioration. Critical care was time spent personally by me on the following activities: development of treatment plan with patient and/or surrogate as well as nursing, discussions with consultants, evaluation of patient's response to treatment, examination of patient, obtaining history from patient or surrogate, ordering and performing treatments and interventions, ordering and review of laboratory studies, ordering and review of radiographic studies, pulse oximetry and re-evaluation of patient's condition.   I saw and evaluated the patient, reviewed the resident's note and I agree with the findings and plan.   Final diagnoses:  Chest pain  NSTEMI (non-ST elevated myocardial infarction)      Vida Roller, MD 05/19/14 0900  Vida Roller, MD 05/19/14 2053

## 2014-05-19 NOTE — ED Notes (Signed)
Pt belongings placed in bag. Shirt, pants, boxers, pack of cigarettes, phone, wallet with id and social security card, shoes, and socks, and a pack of tums.

## 2014-05-19 NOTE — ED Notes (Signed)
Cardiology at the bedside.

## 2014-05-19 NOTE — Progress Notes (Signed)
Patient demanding prescription medication effient and has been given the free thirty day card and told to follow up with cardiology per protocol. Patient states " I need these medications, I'm leaving in 40 minutes."

## 2014-05-19 NOTE — Interval H&P Note (Signed)
History and Physical Interval Note:  05/19/2014 9:54 AM  Brian Ford  has presented today for surgery, with the diagnosis of urgent  The various methods of treatment have been discussed with the patient and family. After consideration of risks, benefits and other options for treatment, the patient has consented to  Procedure(s): LEFT HEART CATHETERIZATION WITH CORONARY ANGIOGRAM (N/A) +/- PCI as a surgical intervention .  The patient's history has been reviewed, patient examined, no change in status, stable for surgery.  I have reviewed the patient's chart and labs.  Questions were answered to the patient's satisfaction.    Cath Lab Visit (complete for each Cath Lab visit)  Clinical Evaluation Leading to the Procedure:   ACS: Yes.    Non-ACS:    Anginal Classification: CCS IV  Anti-ischemic medical therapy: No Therapy  Non-Invasive Test Results: No non-invasive testing performed  Prior CABG: No previous CABG          [name],[name]

## 2014-05-19 NOTE — ED Provider Notes (Signed)
CSN: [id number]     Arrival date & time 05/19/14  [id number] History   First MD Initiated Contact with Patient 05/19/14 [phone]     Chief Complaint  Patient presents with  . Code STEMI   Patient is a 48 y.o. male presenting with chest pain. The history is provided by the patient and the EMS personnel.  Chest Pain Pain location:  Substernal area Pain quality: aching   Pain radiates to:  Does not radiate Pain radiates to the back: no   Pain severity:  Moderate Onset quality:  Sudden Timing:  Constant Progression:  Unchanged Chronicity:  Recurrent Context comment:  Cocaine Relieved by:  Nitroglycerin Associated symptoms: no abdominal pain, no back pain, no cough, no fever, no headache, no nausea, no shortness of breath and not vomiting   Risk factors: coronary artery disease   Risk factors comment:  And drug abuse  48 year old now history of coronary disease status post stent in 2008 and substance abuse presenting chest pain. The pain began last night after using cocaine. Patient is having ongoing pain. Patient was given 2 nitroglycerin and morphine in route.   Past Medical History  Diagnosis Date  . ST elevation myocardial infarction (STEMI) of inferior wall 2012  . CAD S/P percutaneous coronary angioplasty 2012    PCI of 100% RCA - 3.0 mm x 23 mm ( 4.0 mm)  . Hyperlipidemia   . Crack cocaine use   . Tobacco abuse   . Normocytic anemia    Past Surgical History  Procedure Laterality Date  . Coronary angioplasty     No family history on file. History  Substance Use Topics  . Smoking status: Current Every Day Smoker -- 1.00 packs/day for 25 years    Types: Cigarettes  . Smokeless tobacco: Never Used  . Alcohol Use: No    Review of Systems  Constitutional: Negative for fever and chills.  HENT: Negative for rhinorrhea and sore throat.   Eyes: Negative for visual disturbance.  Respiratory: Negative for cough and shortness of breath.   Cardiovascular: Positive for chest pain.   Gastrointestinal: Negative for nausea, vomiting, abdominal pain, diarrhea and constipation.  Genitourinary: Negative for dysuria and hematuria.  Musculoskeletal: Negative for back pain and neck pain.  Skin: Negative for rash.  Neurological: Negative for syncope and headaches.  Psychiatric/Behavioral: Negative for confusion.  All other systems reviewed and are negative.  Allergies  Review of patient's allergies indicates no known allergies.  Home Medications   Prior to Admission medications   Medication Sig Start Date End Date Taking? Authorizing Provider  calcium carbonate (TUMS EX) 750 MG chewable tablet Chew 1-2 tablets by mouth 4 (four) times daily as needed for heartburn.   Yes Historical Provider, MD  ibuprofen (ADVIL,MOTRIN) 200 MG tablet Take 200-800 mg by mouth every 6 (six) hours as needed for mild pain or moderate pain.   Yes Historical Provider, MD  omeprazole (PRILOSEC) 20 MG capsule Take 20 mg by mouth daily.   Yes Historical Provider, MD  aspirin EC 81 MG tablet Take 162 mg by mouth once.    Historical Provider, MD  nitroGLYCERIN (NITROSTAT) 0.4 MG SL tablet Place 1 tablet (0.4 mg total) under the tongue every 5 (five) minutes as needed. 12/25/10   Jodelle GrossKathryn M Lawrence, NP   BP 129/89  Temp(Src) 97.8 F (36.6 C) (Oral)  Resp 20  Ht 5\' 8"  (1.727 m)  Wt 160 lb (72.576 kg)  BMI 24.33 kg/m2  SpO2 98% Physical Exam  Constitutional:  He is oriented to person, place, and time. He appears well-developed and well-nourished. No distress.  Making rude comments at staff  HENT:  Head: Normocephalic and atraumatic.  Mouth/Throat: Oropharynx is clear and moist.  Eyes: EOM are normal.  Neck: Neck supple. No JVD present.  Cardiovascular: Normal rate, regular rhythm, normal heart sounds and intact distal pulses.   Pulmonary/Chest: Effort normal and breath sounds normal.  Abdominal: Soft. He exhibits no distension. There is no tenderness.  Musculoskeletal: Normal range of motion. He  exhibits no edema.  Neurological: He is alert and oriented to person, place, and time. No cranial nerve deficit.  Skin: Skin is warm and dry.  Psychiatric: His behavior is normal.    ED Course  Procedures none   Labs Review Labs Reviewed  URINE RAPID DRUG SCREEN (HOSP PERFORMED) - Abnormal; Notable for the following:    Opiates POSITIVE (*)    Cocaine POSITIVE (*)    Benzodiazepines POSITIVE (*)    All other components within normal limits  COMPREHENSIVE METABOLIC PANEL - Abnormal; Notable for the following:    Potassium 3.6 (*)    Glucose, Bld 118 (*)    GFR calc non Af Amer 77 (*)    GFR calc Af Amer 90 (*)    All other components within normal limits  TROPONIN I - Abnormal; Notable for the following:    Troponin I 6.29 (*)    All other components within normal limits  I-STAT CHEM 8, ED - Abnormal; Notable for the following:    Potassium 3.4 (*)    Glucose, Bld 127 (*)    All other components within normal limits  I-STAT TROPOININ, ED - Abnormal; Notable for the following:    Troponin i, poc 0.17 (*)    All other components within normal limits  MRSA PCR SCREENING  TROPONIN I  MAGNESIUM  TSH  TROPONIN I  TROPONIN I  HEMOGLOBIN A1C    Imaging Review Dg Chest Port 1 View  05/19/2014   CLINICAL DATA:  48 year old male presenting with recent history of chest pain.  EXAM: PORTABLE CHEST - 1 VIEW  COMPARISON:  Chest x-ray 12/15/2006.  FINDINGS: There is a hazy opacity in the perihilar aspect of the left mid to lower lung. Lungs otherwise appear clear. No pleural effusions. No evidence of pulmonary edema. Heart size is normal. Upper mediastinal contours are unremarkable. No pneumothorax. No definite suspicious appearing pulmonary nodule or mass.  IMPRESSION: 1. Hazy perihilar opacity likely within the left lower lobe, concerning for early developing infection. Repeat standing PA and lateral chest radiographs in 2-3 weeks following appropriate trial of antimicrobial therapy are  recommended to ensure resolution of this finding.   Electronically Signed   By: Trudie Reed M.D.   On: 05/19/2014 09:23     EKG Interpretation   Date/Time:  Thursday May 19 2014 08:42:26 EDT Ventricular Rate:  58 PR Interval:  142 QRS Duration: 116 QT Interval:  484 QTC Calculation: 475 R Axis:   62 Text Interpretation:  Sinus rhythm Nonspecific intraventricular conduction  delay Inferior infarct, age indeterminate Since last tracing T wave  abnormality improved but QRS has widened Abnormal ekg Confirmed by MILLER   MD, BRIAN ([id number]) on 05/19/2014 8:48:04 AM      MDM   Final diagnoses:  Chest pain  NSTEMI (non-ST elevated myocardial infarction)   48 yo M with a PMH of CAD presents with chest pain. +Cocaine use last night, suspect this is culprit. +EKG changes. Elevated troponin.  Cardiology will take to cath lab. Hemodynamically stable for transport.   Case discussed with Dr. Hyacinth Meeker.   Maris Berger, MD 05/19/14 [phone]

## 2014-05-19 NOTE — Progress Notes (Signed)
   I was called by the RN because the patient was becoming beligerent & insisting upon leaving AMA.   I came to discuss this decision with him.  I informed him the this was an unwise - "stupid" decision as he has just had an MI with a totally occluded artery that brought him emergently to the cardiac cath lab with Stent placement in the main heart artery.    Our conversation quickly became quite heated with him raising his voice & using curse words.  I tried to convince him to spend the night for monitoring & allow for a stable d/c in the AM - to avoid the ramifications of leaving AMA.    After ~5 min, his friend, who witnessed the conversation, had him calmed down.  I asked him to wait for his Plavix Rx.  He asked to be allowed to go for a walk & talk in order to make a more rational decision.  He feels well & would like to go home - on a Post Anterior MI PCI day, this is not a wise & medically sound decision.  The agreement that I made with him was that if he agrees to spend the night, we could arrange an early AM d/c.  If not, at least wait for his Plavix & statin Rx.  He needs a f/u appt with one of my colleagues in [facility] - this can be arranged via telephone.  Thankfully, his wrist is stable as have his vital signs.  Marykay Lex, MD'

## 2014-05-19 NOTE — CV Procedure (Signed)
CARDIAC CATHETERIZATION AND PERCUTANEOUS CORONARY INTERVENTION REPORT  NAME:  Brian Ford   MRN: [id number] DOB:  [birth year]   ADMIT DATE: 05/19/2014 Procedure Date: 05/19/2014  INTERVENTIONAL CARDIOLOGIST: Brian Ford, M.D., MS PRIMARY CARE PROVIDER: No primary provider on file. PRIMARY CARDIOLOGIST: Dr. Rozann Ford  PATIENT:  Brian Ford is a 48 y.o. male with a history of coronary artery disease status post inferior stenting in 2008 with an occluded mid RCA treated with a bare-metal stent. Brian Ford also has a history of dyslipidemia, crack cocaine and tobacco abuse. Brian Ford was transferred to [facility] emergency room via EMS as a Code STEMI after developing 10 out 10 chest pain earlier this morning. Yesterday while driving, and shortly after using cocaine, Brian Ford developed acute left-sided chest pain not sharp and pressure in nature. This is identical to his prior MI pain. Yesterday lasted about 20 minutes and then radiated to both arms. This morning again Brian Ford woke up with severe 10/10 chest pain that led Ford: Nitroglycerin. Each time Brian Ford received some nitroglycerin Brian Ford did have some resolution of pain, however Brian Ford then had recurrence. Brian Ford had 10 mg of IV morphine in route, but continued to have chest pain. EKG had a suggestion of hyperacute T waves in anteroseptal leads, but did not meet ST elevation -- there were some new T-wave changes in the tear lateral leads there were new from old EKG. No ST elevations.. Brian Ford was thought to the emergency room and evaluated by Dr. Fransico Ford.   Of note, Brian Ford says Brian Ford uses cocaine "all the time "but does not always have chest pain. Brian Ford also has, all yesterday. On arrival to the emergency room the patient was "riding and chest pain very agitated and combative. The decision was made to bring Ford acutely to the cardiac catheterization lab for urgent catheterization for possible ACS and not as a Code STEMI  PRE-OPERATIVE DIAGNOSIS:    Acute Coronary Syndrome   Known  CAD status post PCI  PROCEDURES PERFORMED:    Left Heart Catheterization with Native Coronary Angiography  via Right Radial Artery   Left Ventriculography  Percutaneous Coronary Intervention of mid LAD 100% thrombotic occlusion - reduced to 0%: MultiLink Vision BMS 3.0 mm x 28 mm (3.7 mm proximal and 3.5 mm distal)  PROCEDURE: The patient was brought to the 2nd [facility] Catheter[facility] Lab in the fasting state and prepped and draped in the usual sterile fashion for Right Radial artery access. A modified Allen's test was performed on the right wrist demonstrating excellent collateral flow for radial access.   Sterile technique was used including antiseptics, cap, gloves, gown, hand hygiene, mask and sheet. Skin prep: Chlorhexidine.   Consent: Risks of procedure as well as the alternatives and risks of each were explained to the (patient/caregiver). Consent for procedure obtained.   Time Out: Verified patient identification, verified procedure, site/side was marked, verified correct patient position, special equipment/implants available, medications/allergies/relevent history reviewed, required imaging and test results available. Performed.  Access:   Right Radial Artery: 6 Fr Sheath - Seldinger Technique (Angiocath Micropuncture Kit)  Radial Cocktail - 10 mL; IV Heparin 4000 Units   Left Heart Catheterization: 5 Fr Catheters advanced or exchanged over a Long Exchange Safety J-wire; JR 4 catheter advanced first.  Right Coronary Artery Cineangiography, and LV Hemodynamics (LV Gram): JR 4 Catheter  Left Coronary Artery Cineangiography: JL 3.5 Catheter    Sheath removed in the cardiac catheterization lab with TR band placement for hemostasis.  TR Band: 1105  Hours; 13 mL air  FINDINGS:  Hemodynamics:   Central Aortic Pressure / Mean: 97/6/39 mmHg  Left Ventricular Pressure / LVEDP: 95/5/10 mmHg  Left Ventriculography:  EF: 35-40 %  Wall Motion: Global hypokinesis  with moderate to severe hypokinesis of the basal to mid inferior wall as well as mild/moderate hypokinesis of the anterior apical region  Coronary Anatomy:  Dominance: Right coronary artery  Left Main: Large-caliber vessel that bifurcates distally into the LAD and Circumflex. Angiographically normal. LAD: Large-caliber vessel that gives off a major diagonal branch from the mid vessel. There is also a bifurcating septal perforator trunk (SP 1). Just beyond this branch point the LAD is 100% occluded with likely thrombus.  Post PTCA angiography revealed a long eccentric 70-80% stenosis with very good flow distally. Following PCI there is TIMI-3 flow distally.  There is a another small second diagonal branch coming from the distal mid LAD, the terminal LAD wraps down around the apex perfusion inferoapex.  D1: Moderate caliber vessel that covers a large portion of the anterolateral wall. Angiographically normal.  Left Circumflex: Large-caliber, nondominant vessel it gives off several branches. The proximal OM1 followed by a early mid OM 2. Then the plates course in the AV groove where it gives off 3 posterolateral branches. There is also moderate caliber atrial branch that comes off of the AV groove circumflex.  Minimal luminal irregularities throughout the circumflex system.  OM1: Moderate to large caliber proximal vessel that courses as a almost ramus intermedius. Mild luminal irregularities  OM 2: Large-caliber lateral branch that bifurcates distally. Somewhat tortuous but minimal irregularities. It reaches down to the inferolateral border  3 posterolateral branches with the first one being moderate caliber the other 2 being smaller moderate in caliber. They're tortuous but free of significant disease.   RCA: Large-caliber, dominant vessel with early mid 20% stenosis. There is an 80 slight ectatic segment in the mid vessel just prior to the previously placed bare-metal stent. The entire stented  segment has diffuse in-stent restenosis of about 20-30% with clearly visible stent struts but smooth lumen internally. The vessel then normalizes back to normal diameter. There is upper lobe distal artery marginal branch which is the third order branch. That bifurcates into the Right Posterior AV Groove Branch (RPAV) and Right Posterior Descending Artery (RPDA).  RPDA: Large-caliber vessel that reaches all the way to the apex with several septal perforator branches. Angiographically normal  RPL Sysytem:The RPAV begins as a arch caliber vessel gives off several small branches but really terminates as a major moderate to large caliber posterolateral branch that covers an extensive distribution.  After reviewing the initial angiography, the culprit lesion was thought to be a thrombotic 100% occlusion of the mid LAD after D1 and SP 1.  Preparation were made to proceed with PCI on this lesion. Based on the patient's history of nonadherence to medical therapy, a bare-metal stent will be used.  Percutaneous Coronary Intervention:    IV Angiomax bolus was administered and infusion started. Oral Effient 60 mg was administered.  Lesion: Mid LAD 100% thrombotic occlusion reduced to 0% post-PCI. TIMI 0 flow pre-, TIMI-3 flow post  Guide: 6 Fr   XB LAD  Guidewire: BMW Predilation Balloon: Euphora 2.5 mm x 12 mm;   10 Atm x 30 Sec - 2 inflations, restoring TIMI 2-3 flow distally Stent: MultiLink Vision BMS 3.0 mm x 28 mm;   18 Atm x 30 Sec - mild distal diameter 3.4 mm Post-dilation  Balloon: [place]  3.5 mm x 12 mm;   16 Atm x 45 Sec - distal stent,18 Atm x 45 Sec - mid stent, 20 Atm x 45 Sec - proximal stent  Final Diameter: 3.7 mm proximal tapered down to 3.5 mm distal  Post deployment angiography in multiple views, with and without guidewire in place revealed excellent stent deployment and lesion coverage.  There was no evidence of dissection or perforation.  MEDICATIONS:  Anesthesia:  Local  Lidocaine 2 ml  Sedation:  5 mg IV Versed, 125 mcg IV fentanyl ;   Premedication: Brian Ford was given 10 mg IV morphine in route by EMS.  Omnipaque Contrast: 170 ml  Anticoagulation:  IV Heparin 4000 Units ; Angiomax Bolus & drip  Anti-Platelet Agent:  Effient 60 mg  Intracoronary Nitroglycerin 200 Mcg Radial Cocktail: 5 mg Verapamil, 400 mcg NTG, 2 ml 2% Lidocaine in 10 ml NS  PATIENT DISPOSITION:    The patient was transferred to the PACU holding area in a hemodynamicaly stable, chest pain free condition.  The patient tolerated the procedure well, and there were no complications.  EBL:   < 10 ml  The patient was stable before, during, and after the procedure.  POST-OPERATIVE DIAGNOSIS:    Severe single vessel disease with 100% occluded mid LAD after D1 and SP 1 - status post successful PCI with MultiLink Vision BMS 2.0 mm x 20 mm postdilated as noted above.  Patent mid RCA stent with 20% diffuse ISR. Otherwise minimal luminal irregularities throughout the rest of the coronary arteries.  Moderate to severely reduced ejection fraction with EF of 35-40% and basal to mid inferior hypokinesis with also and draped widely as a  PLAN OF CARE:  The patient was admitted to the [facility] after being in the PACU holding area where the verruca assignment. Her mandible removed according to protocol .  Angiomax will continue at current dose until the bag is complete.  Dual therapy for 1 year is recommended, for stent versus required for one month. Brian Ford will get the free month of Effient, Brian Ford can then continue on either antiplatelet agent in addition to 81 mg aspirin.  Would check 2-D echocardiogram to confirm EF  This is a fast track discharged with plan to follow up with Dr. Domenic Polite in [facility] clinic (either [place] or [place]).    Brian Ford, M.D., M.S. Interventional Cardiologist   Pager # [phone]

## 2014-05-20 ENCOUNTER — Encounter (HOSPITAL_COMMUNITY): Payer: Self-pay | Admitting: Physician Assistant

## 2014-05-20 DIAGNOSIS — I213 ST elevation (STEMI) myocardial infarction of unspecified site: Secondary | ICD-10-CM

## 2014-05-20 DIAGNOSIS — I2510 Atherosclerotic heart disease of native coronary artery without angina pectoris: Secondary | ICD-10-CM | POA: Diagnosis present

## 2014-05-20 DIAGNOSIS — E785 Hyperlipidemia, unspecified: Secondary | ICD-10-CM

## 2014-05-20 DIAGNOSIS — Z955 Presence of coronary angioplasty implant and graft: Secondary | ICD-10-CM

## 2014-05-20 DIAGNOSIS — D649 Anemia, unspecified: Secondary | ICD-10-CM | POA: Diagnosis present

## 2014-05-20 DIAGNOSIS — R0789 Other chest pain: Secondary | ICD-10-CM

## 2014-05-20 DIAGNOSIS — I214 Non-ST elevation (NSTEMI) myocardial infarction: Principal | ICD-10-CM

## 2014-05-20 LAB — BASIC METABOLIC PANEL
Anion gap: 14 (ref 5–15)
BUN: 9 mg/dL (ref 6–23)
CO2: 22 meq/L (ref 19–32)
Calcium: 9 mg/dL (ref 8.4–10.5)
Chloride: 106 mEq/L (ref 96–112)
Creatinine, Ser: 1.1 mg/dL (ref 0.50–1.35)
GFR calc Af Amer: >90 mL/min (ref >90)
GFR calc non Af Amer: 78 mL/min — ABNORMAL LOW (ref >90)
GLUCOSE: 88 mg/dL (ref 70–99)
POTASSIUM: 4.1 meq/L (ref 3.7–5.3)
Sodium: 142 mEq/L (ref 137–147)

## 2014-05-20 LAB — CBC
HEMATOCRIT: 42 % (ref 39.0–52.0)
Hemoglobin: 14.5 g/dL (ref 13.0–17.0)
MCH: 28.8 pg (ref 26.0–34.0)
MCHC: 34.5 g/dL (ref 30.0–36.0)
MCV: 83.5 fL (ref 78.0–100.0)
Platelets: 203 10*3/uL (ref 150–400)
RBC: 5.03 MIL/uL (ref 4.22–5.81)
RDW: 13.4 % (ref 11.5–15.5)
WBC: 7.6 10*3/uL (ref 4.0–10.5)

## 2014-05-20 LAB — TROPONIN I: Troponin I: 8.69 ng/mL (ref <0.30)

## 2014-05-20 MED ORDER — PRASUGREL HCL 10 MG PO TABS: 10.0000 mg | ORAL_TABLET | Freq: Every day | ORAL | Status: DC

## 2014-05-20 MED ORDER — ATORVASTATIN CALCIUM 40 MG PO TABS: 40.0000 mg | ORAL_TABLET | Freq: Every day | ORAL | Status: DC

## 2014-05-20 MED ORDER — LISINOPRIL 5 MG PO TABS: 5.0000 mg | ORAL_TABLET | Freq: Every day | ORAL | Status: DC

## 2014-05-20 MED ORDER — DIPHENHYDRAMINE HCL 25 MG PO CAPS
25.0000 mg | ORAL_CAPSULE | Freq: Once | ORAL | Status: AC
  Administered 2014-05-20: 25 mg via ORAL
  Filled 2014-05-20: qty 1

## 2014-05-20 MED ORDER — LISINOPRIL 5 MG PO TABS
5.0000 mg | ORAL_TABLET | Freq: Every day | ORAL | Status: DC
  Filled 2014-05-20: qty 1

## 2014-05-20 MED ORDER — CLOPIDOGREL BISULFATE 75 MG PO TABS: 75.0000 mg | ORAL_TABLET | Freq: Every day | ORAL | Status: DC

## 2014-05-20 MED FILL — Sodium Chloride IV Soln 0.9%: INTRAVENOUS | Qty: 50 | Status: AC

## 2014-05-20 NOTE — Progress Notes (Signed)
Pt educated on needing to stay in room and reeducated on not being allowed to smoke. Pt wants to leave but only with MDs ok so insurance will pay for his care. MD notified. Dr. Adolm Joseph spoke on phone with patient. New orders received. Will continue to monitor.

## 2014-05-20 NOTE — Discharge Summary (Signed)
I personally saw & examined the patient this AM prior to discharge.   Moderately elevated Troponin despite 100% LAD.    Did not want to wait for Echocardiogram -- can be done as OP if he follows up.  I personally counseled him on Smoking & Cocaine cessation.  I also counseled him on the importance of taking his Cardiac Medications -- especiallly the Effient / Plavix.  Hopefully, he will actually come to a follow-up appt. We will need to contact him @ home after the weekend.  Marykay Lex, M.D., M.S. Interventional Cardiologist   Pager # [phone]

## 2014-05-20 NOTE — Progress Notes (Signed)
Patient found smoking again despite multiple warnings. Security called. Cigarettes confiscated. Allowed to walk around unit.

## 2014-05-20 NOTE — Progress Notes (Signed)
Pt walked out of room. Pt threatened to leave. Repeated education on not being allowed to smoke in room. Security has been called.

## 2014-05-20 NOTE — Progress Notes (Addendum)
48 y.o. male with a history of coronary artery disease status post inferior stenting in 2008 with an occluded mid RCA treated with a bare-metal stent. He also has a history of dyslipidemia, crack cocaine and tobacco abuse. He was transferred to [facility] emergency room via EMS as a Code STEMI - EKG did not meet criteria, but with ongoing angina - urgent  Cardiac cath done -- 100% LAD occlusion after Diag --> BMS PCI. Threatened to leave AMA yesterday - agreed to stay overnight for monitoring.  Subjective:  "Ready to go home"  No further pain.  No breathing issues.  Objective:  Vital Signs in the last 24 hours: Temp:  [97.3 F (36.3 C)-98.7 F (37.1 C)] 98.3 F (36.8 C) (10/16 0728) Pulse Rate:  [52-91] 91 (10/16 0728) Resp:  [13-23] 22 (10/15 1620) BP: (114-170)/(57-94) 139/90 mmHg (10/16 0728) SpO2:  [98 %-100 %] 98 % (10/16 0728) Weight:  [160 lb (72.576 kg)] 160 lb (72.576 kg) (10/15 0845)  Intake/Output from previous day: 10/15 0701 - 10/16 0700 In: 688.4 [P.O.:444; I.V.:244.4] Out: 425 [Urine:425] Intake/Output from this shift:    Physical Exam: General appearance: alert, cooperative, appears stated age and no distress Neck: no adenopathy, no carotid bruit, no JVD and supple, symmetrical, trachea midline Lungs: clear to auscultation bilaterally and normal percussion bilaterally Heart: regular rate and rhythm, S1, S2 normal, no murmur, click, rub or gallop and normal apical impulse Abdomen: soft, non-tender; bowel sounds normal; no masses,  no organomegaly Extremities: extremities normal, atraumatic, no cyanosis or edema Pulses: 2+ and symmetric Neurologic: Grossly normal R wrist stable - normal Allen's  Lab Results:  Recent Labs  05/19/14 0849  HGB 15.3    Recent Labs  05/19/14 0849 05/19/14 1350  NA 140 141  K 3.4* 3.6*  CL 106 103  CO2  --  25  GLUCOSE 127* 118*  BUN 10 9  CREATININE 1.10 1.11    Recent Labs  05/19/14 2108 05/20/14 0300    TROPONINI 13.09* 8.69*   Hepatic Function Panel  Recent Labs  05/19/14 1350  PROT 7.5  ALBUMIN 3.8  AST 32  ALT 19  ALKPHOS 89  BILITOT 0.3   No results found for this basename: CHOL,  in the last 72 hours No results found for this basename: PROTIME,  in the last 72 hours  Imaging: Reviwed  Cardiac Studies: Cath reviewed - 100% mid LAD   Assessment/Plan:  Principal Problem:   Acute myocardial infarction involving left coronary artery Active Problems:   CAD S/P percutaneous coronary angioplasty   Acute coronary syndrome   Presence of Bare Metal stent in LAD coronary artery   Hyperlipidemia with target LDL less than 70   Cocaine abuse   Tobacco abuse   MI, old  No further angina.  Did not leave AMA last PM - but is ready to leave.  Troponin levels have peaked @ ~13 & trending down. No arrhythmia.  BP ~ stable.  He has walked all over the unit & "feels fine".  A bit sleepy this AM - poor sleep. Echo still not done (if able to get done prior to D/C - good, but would not hold up d/c).  No BB due to Cocaine abuse & he cannot assure me that he will not use again.  Statin.  Effient for Month 1 (has card) -- after 1 month can convert to Plavix.  I have strongly encouraged that he f/u with a cardiologist in [place] (he would  like to do disability)  He will not do cardiac rehab. Counseled on Smoking & Cocaine cessation - does not seem interested.   LOS: 1 day    Levon Boettcher,Jb W 05/20/2014, 7:54 AM

## 2014-05-20 NOTE — Progress Notes (Addendum)
Pt admitted to smoking cigarettes in room to [facility]. Pt educated. Charge nurse notified. Will take further action if happens again.   Dr. Cleophas Dunker made aware.

## 2014-05-20 NOTE — Discharge Summary (Signed)
Discharge Summary   Patient ID: Brian Ford MRN: [id number], DOB/AGE: 48-Aug-1967 48 y.o. Admit date: 05/19/2014 D/C date:     05/20/2014  Primary Cardiologist: Dr. Domenic Polite   Principal Problem:   Acute coronary syndrome Active Problems:   MI, old   Cocaine abuse   Tobacco abuse   Presence of Bare Metal stent in LAD coronary artery   Hyperlipidemia   Normocytic anemia   CAD (coronary artery disease)    Admission Dates: 05/19/14- 05/20/14 Discharge Diagnosis: NSTEMI s/p urgent LHC and BMS to LAD for 100% occluded LAD.   HPI: Brian Ford is a 48 y.o. male with a history of ASCAD with remote STEMI in 2008 with an occluded mid RCA s/p PCI, dyslipidemia, crack cocaine use, tobacco use and anemia who presented to the [facility] ED on 05/19/14 with complaints of CP.   He reported that he developed chest pain while driving that was both sharp and pressure, identical to his prior pain with his MI and lasted about 20 minutes and resolved. There was radiation of the pain to both arms. This occurred after using cocaine. He had another episode lasting about 20 minutes. On the morning of admission he was awakened from sleep with severe CP radiating down both arms. It was 10/10 and he called EMS. He was given 2 SL NTG and Morphine 94m in route by EMS with improvement of CP but now pain has worsened again. The patient is writhing in the bed. The last time he did cocaine was yesterday. He says that he does cocaine "all the time" and this pain is not from cocaine. He also had been drinking ETOH as well.  EKG revealed some new T wave changes in the lateral precordial leads which were new from old EKG. He continued to have severe CP and was very agitated and combative.   Hospital Course  ACS[facility]NSTEMI- [facility], but EKG did not meet criteria, but with ongoing angina he was taken for urgent cardiac cath.  -- Peak troponin 18.  -- s/p LHC on 05/19/14 which  revealed: Severe single vessel disease with 100% occluded mid LAD after D1 and SP 1 - s/p successful PCI with MultiLink Vision BMS 2.0 mm x 20 mm postdilated as noted above.  Patent mid RCA stent with 20% diffuse ISR. Otherwise minimal luminal irregularities throughout the rest of the coronary arteries.  Moderate to severely reduced ejection fraction with EF of 35-40% and basal to mid inferior hypokinesis -- Continue statin. No BB due to cocaine abuse, Effient for 1 mo (he has card for free month)/ convert to Plavix after 1 mo.  -- Refused cardiac rehab  Ischemic CM EF 35-40% by LHC  -- Continue Lisinopril 564m No BB due to ongoing cocaine use. -- Ideally, we would have liked to do an ECHO but patient unwilling to stay and wanting to be discharged or threatened to leave AMA  Polysubstance abuse- counseled on smoking & cocaine cessation does not seem interested   The patient has had an uncomplicated hospital course and is recovering well. The radial catheter site is stable. He has been seen by Dr. HaEllyn Hackoday and deemed ready for discharge home. All follow-up appointments have been scheduled. Smoking cessation was disscussed in length. A written RX for a 30 day free supply of Effient was provided for the patient. Discharge medications are listed below. He has been provided with written Rx for all  of the medications below.    Discharge Vitals: Blood pressure 139/90, pulse 91, temperature 98.3 F (36.8 C), temperature source Oral, resp. rate 22, height 5' 8"  (1.727 m), weight 160 lb (72.576 kg), SpO2 98.00%.  Labs: Lab Results  Component Value Date   WBC 7.6 05/20/2014   HGB 14.5 05/20/2014   HCT 42.0 05/20/2014   MCV 83.5 05/20/2014   PLT 203 05/20/2014     Recent Labs Lab 05/19/14 1350  NA 141  K 3.6*  CL 103  CO2 25  BUN 9  CREATININE 1.11  CALCIUM 9.2  PROT 7.5  BILITOT 0.3  ALKPHOS 89  ALT 19  AST 32  GLUCOSE 118*    Recent Labs  05/19/14 0840 05/19/14 1350  05/19/14 2108 05/20/14 0300  TROPONINI <0.30 6.29* 13.09* 8.69*      Diagnostic Studies/Procedures   Dg Chest Port 1 View  05/19/2014   CLINICAL DATA:  48 year old male presenting with recent history of chest pain.  EXAM: PORTABLE CHEST - 1 VIEW  COMPARISON:  Chest x-ray 12/15/2006.  FINDINGS: There is a hazy opacity in the perihilar aspect of the left mid to lower lung. Lungs otherwise appear clear. No pleural effusions. No evidence of pulmonary edema. Heart size is normal. Upper mediastinal contours are unremarkable. No pneumothorax. No definite suspicious appearing pulmonary nodule or mass.  IMPRESSION: 1. Hazy perihilar opacity likely within the left lower lobe, concerning for early developing infection. Repeat standing PA and lateral chest radiographs in 2-3 weeks following appropriate trial of antimicrobial therapy are recommended to ensure resolution of this finding.         CARDIAC CATHETERIZATION AND PERCUTANEOUS CORONARY INTERVENTION REPORT  NAME: Brian Ford MRN: [id number]  DOB: 04-19-1966 ADMIT DATE: 05/19/2014  Procedure Date: 05/19/2014  INTERVENTIONAL CARDIOLOGIST: Leonie Man, M.D., MS  PRIMARY CARE Skyylar Kopf: No primary Calen Posch on file.  PRIMARY CARDIOLOGIST: Dr. Rozann Lesches  PATIENT: Brian Ford is a 48 y.o. male with a history of coronary artery disease status post inferior stenting in 2008 with an occluded mid RCA treated with a bare-metal stent. He also has a history of dyslipidemia, crack cocaine and tobacco abuse. He was transferred to [facility] emergency room via EMS as a Code STEMI after developing 10 out 10 chest pain earlier this morning.  Yesterday while driving, and shortly after using cocaine, he developed acute left-sided chest pain not sharp and pressure in nature. This is identical to his prior MI pain. Yesterday lasted about 20 minutes and then radiated to both arms. This morning again he woke up with severe 10/10 chest pain that led him:  Nitroglycerin. Each time he received some nitroglycerin he did have some resolution of pain, however he then had recurrence. He had 10 mg of IV morphine in route, but continued to have chest pain. EKG had a suggestion of hyperacute T waves in anteroseptal leads, but did not meet ST elevation -- there were some new T-wave changes in the tear lateral leads there were new from old EKG. No ST elevations.. He was thought to the emergency room and evaluated by Dr. Fransico Him.  Of note, he says he uses cocaine "all the time "but does not always have chest pain. He also has, all yesterday.  On arrival to the emergency room the patient was "riding and chest pain very agitated and combative. The decision was made to bring him acutely to the cardiac catheterization lab for urgent catheterization for possible ACS and not as a  Code STEMI  PRE-OPERATIVE DIAGNOSIS:  Acute Coronary Syndrome  Known CAD status post PCI PROCEDURES PERFORMED:  Left Heart Catheterization with Native Coronary Angiography via Right Radial Artery  Left Ventriculography  Percutaneous Coronary Intervention of mid LAD 100% thrombotic occlusion - reduced to 0%: MultiLink Vision BMS 3.0 mm x 28 mm (3.7 mm proximal and 3.5 mm distal) PROCEDURE: The patient was brought to the [facility] in the fasting state and prepped and draped in the usual sterile fashion for Right Radial artery access. A modified Allen's test was performed on the right wrist demonstrating excellent collateral flow for radial access. Sterile technique was used including antiseptics, cap, gloves, gown, hand hygiene, mask and sheet. Skin prep: Chlorhexidine.  Consent: Risks of procedure as well as the alternatives and risks of each were explained to the (patient/caregiver). Consent for procedure obtained.  Time Out: Verified patient identification, verified procedure, site/side was marked, verified correct patient position, special  equipment/implants available, medications/allergies/relevent history reviewed, required imaging and test results available. Performed.  Access:  Right Radial Artery: 6 Fr Sheath - Seldinger Technique (Angiocath Micropuncture Kit)  Radial Cocktail - 10 mL; IV Heparin 4000 Units  Left Heart Catheterization: 5 Fr Catheters advanced or exchanged over a Long Exchange Safety J-wire; JR 4 catheter advanced first.  Right Coronary Artery Cineangiography, and LV Hemodynamics (LV Gram): JR 4 Catheter  Left Coronary Artery Cineangiography: JL 3.5 Catheter  Sheath removed in the cardiac catheterization lab with TR band placement for hemostasis.  TR Band: 1105 Hours; 13 mL air  FINDINGS:  Hemodynamics:  Central Aortic Pressure / Mean: 97/6/39 mmHg  Left Ventricular Pressure / LVEDP: 95/5/10 mmHg Left Ventriculography:  EF: 35-40 %  Wall Motion: Global hypokinesis with moderate to severe hypokinesis of the basal to mid inferior wall as well as mild/moderate hypokinesis of the anterior apical region Coronary Anatomy:  Dominance: Right coronary artery Left Main: Large-caliber vessel that bifurcates distally into the LAD and Circumflex. Angiographically normal. LAD: Large-caliber vessel that gives off a major diagonal branch from the mid vessel. There is also a bifurcating septal perforator trunk (SP 1). Just beyond this branch point the LAD is 100% occluded with likely thrombus.  Post PTCA angiography revealed a long eccentric 70-80% stenosis with very good flow distally. Following PCI there is TIMI-3 flow distally. There is a another small second diagonal branch coming from the distal mid LAD, the terminal LAD wraps down around the apex perfusion inferoapex.  D1: Moderate caliber vessel that covers a large portion of the anterolateral wall. Angiographically normal. Left Circumflex: Large-caliber, nondominant vessel it gives off several branches. The proximal OM1 followed by a early mid OM 2. Then the plates  course in the AV groove where it gives off 3 posterolateral branches. There is also moderate caliber atrial branch that comes off of the AV groove circumflex. Minimal luminal irregularities throughout the circumflex system.  OM1: Moderate to large caliber proximal vessel that courses as a almost ramus intermedius. Mild luminal irregularities  OM 2: Large-caliber lateral branch that bifurcates distally. Somewhat tortuous but minimal irregularities. It reaches down to the inferolateral border  3 posterolateral branches with the first one being moderate caliber the other 2 being smaller moderate in caliber. They're tortuous but free of significant disease. RCA: Large-caliber, dominant vessel with early mid 20% stenosis. There is an 80 slight ectatic segment in the mid vessel just prior to the previously placed bare-metal stent. The entire stented segment has diffuse in-stent restenosis  of about 20-30% with clearly visible stent struts but smooth lumen internally. The vessel then normalizes back to normal diameter. There is upper lobe distal artery marginal branch which is the third order branch. That bifurcates into the Right Posterior AV Groove Branch (RPAV) and Right Posterior Descending Artery (RPDA).  RPDA: Large-caliber vessel that reaches all the way to the apex with several septal perforator branches. Angiographically normal  RPL Sysytem:The RPAV begins as a arch caliber vessel gives off several small branches but really terminates as a major moderate to large caliber posterolateral branch that covers an extensive distribution. After reviewing the initial angiography, the culprit lesion was thought to be a thrombotic 100% occlusion of the mid LAD after D1 and SP 1. Preparation were made to proceed with PCI on this lesion. Based on the patient's history of nonadherence to medical therapy, a bare-metal stent will be used.  Percutaneous Coronary Intervention:  IV Angiomax bolus was administered and  infusion started. Oral Effient 60 mg was administered.  Lesion: Mid LAD 100% thrombotic occlusion reduced to 0% post-PCI. TIMI 0 flow pre-, TIMI-3 flow post  Guide: 6 Fr XB LAD Guidewire: BMW  Predilation Balloon: Euphora 2.5 mm x 12 mm;  10 Atm x 30 Sec - 2 inflations, restoring TIMI 2-3 flow distally Stent: MultiLink Vision BMS 3.0 mm x 28 mm;  18 Atm x 30 Sec - mild distal diameter 3.4 mm Post-dilation Balloon: [place] 3.5 mm x 12 mm;  16 Atm x 45 Sec - distal stent,18 Atm x 45 Sec - mid stent, 20 Atm x 45 Sec - proximal stent  Final Diameter: 3.7 mm proximal tapered down to 3.5 mm distal Post deployment angiography in multiple views, with and without guidewire in place revealed excellent stent deployment and lesion coverage. There was no evidence of dissection or perforation.  MEDICATIONS:  Anesthesia: Local Lidocaine 2 ml Sedation: 5 mg IV Versed, 125 mcg IV fentanyl ;  Premedication: He was given 10 mg IV morphine in route by EMS. Omnipaque Contrast: 170 ml  Anticoagulation: IV Heparin 4000 Units ; Angiomax Bolus & drip  Anti-Platelet Agent: Effient 60 mg  Intracoronary Nitroglycerin 200 Mcg Radial Cocktail: 5 mg Verapamil, 400 mcg NTG, 2 ml 2% Lidocaine in 10 ml NS PATIENT DISPOSITION:  The patient was transferred to the PACU holding area in a hemodynamicaly stable, chest pain free condition.  The patient tolerated the procedure well, and there were no complications. EBL: < 10 ml  The patient was stable before, during, and after the procedure. POST-OPERATIVE DIAGNOSIS:  Severe single vessel disease with 100% occluded mid LAD after D1 and SP 1 - status post successful PCI with MultiLink Vision BMS 2.0 mm x 20 mm postdilated as noted above.  Patent mid RCA stent with 20% diffuse ISR. Otherwise minimal luminal irregularities throughout the rest of the coronary arteries.  Moderate to severely reduced ejection fraction with EF of 35-40% and basal to mid inferior hypokinesis with also and  draped widely as a PLAN OF CARE:  The patient was admitted to the [facility] after being in the PACU holding area where the verruca assignment. Her mandible removed according to protocol .  Angiomax will continue at current dose until the bag is complete.  Dual therapy for 1 year is recommended, for stent versus required for one month. He will get the free month of Effient, he can then continue on either antiplatelet agent in addition to 81 mg aspirin.  Would check 2-D echocardiogram to confirm EF  This is a fast track discharged with plan to follow up with Dr. Domenic Polite in [facility] clinic (either [place] or [place]).     Discharge Medications     Medication List    STOP taking these medications       ibuprofen 200 MG tablet  Commonly known as:  ADVIL,MOTRIN      TAKE these medications       aspirin EC 81 MG tablet  Take 162 mg by mouth once.     atorvastatin 40 MG tablet  Commonly known as:  LIPITOR  Take 1 tablet (40 mg total) by mouth daily.     calcium carbonate 750 MG chewable tablet  Commonly known as:  TUMS EX  Chew 1-2 tablets by mouth 4 (four) times daily as needed for heartburn.     clopidogrel 75 MG tablet  Commonly known as:  PLAVIX  Take 1 tablet (75 mg total) by mouth daily.  Start taking on:  06/19/2014     lisinopril 5 MG tablet  Commonly known as:  PRINIVIL,ZESTRIL  Take 1 tablet (5 mg total) by mouth daily.     nitroGLYCERIN 0.4 MG SL tablet  Commonly known as:  NITROSTAT  Place 1 tablet (0.4 mg total) under the tongue every 5 (five) minutes as needed.     omeprazole 20 MG capsule  Commonly known as:  PRILOSEC  Take 20 mg by mouth daily.     prasugrel 10 MG Tabs tablet  Commonly known as:  EFFIENT  Take 1 tablet (10 mg total) by mouth daily.        Disposition   The patient will be discharged in stable condition to home.  Follow-up Information   Follow up with Jory Sims, NP On 05/30/2014. (@ 2:30am for cardiology follow up)     Specialty:  Nurse Practitioner   Contact information:   [place] [address] [phone]         Duration of Discharge Encounter: Greater than 30 minutes including physician and PA time.  Signed, [place] Silos, KATHRYN R PA-C 05/20/2014, 8:40 AM

## 2014-05-27 NOTE — Progress Notes (Signed)
HPI: [name] is a 48 year old patient of Dr. Diona Browner. That we are following post hospitalization after admission for a non-ST elevation MI, status post left heart cath with bare-metal stent to the LAD in the setting 100 percent occluded LAD. The patient developed chest pain while driving with both sharp and pressure identical to pain. He had prior MI, which lasted about 20 minutes and resolved. Pain radiated down both arms. This did occur after using cocaine. The patient had a MultiLink vision bare-metal stent for severe single-vessel disease of a 100% occluded LAD after the diagonal 1, and septal perforator.  He was continued on statin, no beta blocker due to cocaine use, appendectomy, and for one month and converted to Plavix after one month. EF was found to be 35-40% by left heart catheterization. He was started on lisinopril 5 mg daily. He is due for repeat echocardiogram.  He comes today agitated. He has not been taking any of the medications prescribed, with the exception of the 30 day free supply of Effient. He has not stopped using cocaine. He is uncertain what the medications are for. He wants medications for free. He is concerned that his stents are going to block back, and he wants to have a cardiac catheterization nearly. He gets his medications from the health Department, but has not gone by, and to take his prescriptions.   No Known Allergies  Current Outpatient Prescriptions  Medication Sig Dispense Refill  . aspirin EC 81 MG tablet Take 2 tablets (162 mg total) by mouth once.  60 tablet  11  . atorvastatin (LIPITOR) 40 MG tablet Take 1 tablet (40 mg total) by mouth daily.  30 tablet  11  . atorvastatin (LIPITOR) 40 MG tablet Take 1 tablet (40 mg total) by mouth daily.  30 tablet  11  . calcium carbonate (TUMS EX) 750 MG chewable tablet Chew 1-2 tablets by mouth 4 (four) times daily as needed for heartburn.      Melene Muller ON 06/19/2014] clopidogrel (PLAVIX) 75 MG tablet Take 1  tablet (75 mg total) by mouth daily.  30 tablet  11  . [START ON 06/19/2014] clopidogrel (PLAVIX) 75 MG tablet Take 1 tablet (75 mg total) by mouth daily.  30 tablet  11  . lisinopril (PRINIVIL,ZESTRIL) 5 MG tablet Take 1 tablet (5 mg total) by mouth daily.  30 tablet  11  . lisinopril (PRINIVIL,ZESTRIL) 5 MG tablet Take 1 tablet (5 mg total) by mouth daily.  30 tablet  11  . omeprazole (PRILOSEC) 20 MG capsule Take 20 mg by mouth daily.      . nitroGLYCERIN (NITROSTAT) 0.4 MG SL tablet Place 1 tablet (0.4 mg total) under the tongue every 5 (five) minutes as needed.  25 tablet  3   No current facility-administered medications for this visit.    Past Medical History  Diagnosis Date  . ST elevation myocardial infarction (STEMI) of inferior wall 2012  . CAD (coronary artery disease) 2012    a. PCI of 100% RCA (2012) b. s/p BMS to LAD  . Hyperlipidemia   . Crack cocaine use   . Tobacco abuse   . Normocytic anemia     Past Surgical History  Procedure Laterality Date  . Coronary angioplasty      QTM:AUQJFH of systems complete and found to be negative unless listed above  PHYSICAL EXAM BP 142/84  Pulse 68  Ht 5\' 8"  (1.727 m)  Wt 165 lb (74.844 kg)  BMI 25.09  kg/m2 General: Well developed, well nourished, in no acute distress Head: Eyes PERRLA, No xanthomas.   Normal cephalic and atramatic  Lungs: Clear bilaterally to auscultation and percussion. Heart: HRRR S1 S2, tachycardic without MRG.  Pulses are 2+ & equal.            No carotid bruit. No JVD.  No abdominal bruits. No femoral bruits. Abdomen: Bowel sounds are positive, abdomen soft and non-tender without masses or                  Hernia's noted. Msk:  Back normal, normal gait. Normal strength and tone for age. Extremities: No clubbing, cyanosis or edema.  DP +1 Neuro: Alert and oriented X 3. Psych:  Good affect, agitated, responds appropriately  ASSESSMENT AND PLAN

## 2014-05-30 ENCOUNTER — Encounter: Payer: Self-pay | Admitting: Adult Health

## 2014-05-30 ENCOUNTER — Ambulatory Visit (INDEPENDENT_AMBULATORY_CARE_PROVIDER_SITE_OTHER): Payer: Self-pay | Admitting: Adult Health

## 2014-05-30 VITALS — BP 142/84 | HR 68 | Ht 68.0 in | Wt 165.0 lb

## 2014-05-30 DIAGNOSIS — Z9861 Coronary angioplasty status: Secondary | ICD-10-CM

## 2014-05-30 DIAGNOSIS — I2510 Atherosclerotic heart disease of native coronary artery without angina pectoris: Secondary | ICD-10-CM

## 2014-05-30 DIAGNOSIS — F1410 Cocaine abuse, uncomplicated: Secondary | ICD-10-CM

## 2014-05-30 DIAGNOSIS — Z720 Tobacco use: Secondary | ICD-10-CM

## 2014-05-30 MED ORDER — LISINOPRIL 5 MG PO TABS: 5.0000 mg | ORAL_TABLET | Freq: Every day | ORAL | Status: DC

## 2014-05-30 MED ORDER — ATORVASTATIN CALCIUM 40 MG PO TABS: 40.0000 mg | ORAL_TABLET | Freq: Every day | ORAL | Status: DC

## 2014-05-30 MED ORDER — CLOPIDOGREL BISULFATE 75 MG PO TABS: 75.0000 mg | ORAL_TABLET | Freq: Every day | ORAL | Status: DC

## 2014-05-30 MED ORDER — NITROGLYCERIN 0.4 MG SL SUBL: 0.4000 mg | SUBLINGUAL_TABLET | SUBLINGUAL | Status: DC | PRN

## 2014-05-30 MED ORDER — ASPIRIN EC 81 MG PO TBEC: 162.0000 mg | DELAYED_RELEASE_TABLET | Freq: Once | ORAL | Status: DC

## 2014-05-30 NOTE — Assessment & Plan Note (Signed)
Ongoing abuse. Cessation is strongly recommended.

## 2014-05-30 NOTE — Assessment & Plan Note (Signed)
He also has ischemic cardiopathy with most recent echocardiogram, revealing EF of 35-40%. I talked with him about his heart function and need to stop the cocaine in take. His medications regularly to avoid recurrent episodes. He verbalizes understanding and is very agitated and wishes to have his medications given to him for free and to be placed on disability. I explained to him that if he takes his medications regularly, that this would not be necessary. He does not appear happy with this information

## 2014-05-30 NOTE — Assessment & Plan Note (Signed)
He wishes to be referred to an inpatient facility to stop cocaine. I have given him phone numbers to behavior health and 2. RTC in [place] for him to followup on his own. I have spoken with the people in the ER. He states that there are no intakes for cocaine abuse in [facility]. They can also give him more phone number should he be willing to followup on this. I have advised him of the dangers of continuing cocaine.

## 2014-05-30 NOTE — Progress Notes (Deleted)
Name: Brian Ford    DOB: 1966-06-12  Age: 48 y.o.  MR#: [id number]       PCP:  No PCP Per Patient      Insurance: Payor: MEDICAID POTENTIAL / Plan: MEDICAID POTENTIAL / Product Type: *No Product type* /   CC:    Chief Complaint  Patient presents with  . Coronary Artery Disease    BMS to LAD  . Hyperlipidemia    VS Filed Vitals:   05/30/14 1420  BP: 142/84  Pulse: 68  Height: _0  (1.727 m)  Weight: 165 lb (74.844 kg)    Weights Current Weight  05/30/14 165 lb (74.844 kg)  05/19/14 160 lb (72.576 kg)  05/19/14 160 lb (72.576 kg)    Blood Pressure  BP Readings from Last 3 Encounters:  05/30/14 142/84  05/20/14 139/90  05/20/14 139/90     Admit date:  (Not on file) Last encounter with RMR:  Visit date not found   Allergy Review of patient's allergies indicates no known allergies.  Current Outpatient Prescriptions  Medication Sig Dispense Refill  . aspirin EC 81 MG tablet Take 162 mg by mouth once.      Marland Kitchen atorvastatin (LIPITOR) 40 MG tablet Take 1 tablet (40 mg total) by mouth daily.  30 tablet  11  . calcium carbonate (TUMS EX) 750 MG chewable tablet Chew 1-2 tablets by mouth 4 (four) times daily as needed for heartburn.      Derrill Memo ON 06/19/2014] clopidogrel (PLAVIX) 75 MG tablet Take 1 tablet (75 mg total) by mouth daily.  30 tablet  11  . lisinopril (PRINIVIL,ZESTRIL) 5 MG tablet Take 1 tablet (5 mg total) by mouth daily.  30 tablet  11  . omeprazole (PRILOSEC) 20 MG capsule Take 20 mg by mouth daily.      . prasugrel (EFFIENT) 10 MG TABS tablet Take 1 tablet (10 mg total) by mouth daily.  30 tablet  0  . nitroGLYCERIN (NITROSTAT) 0.4 MG SL tablet Place 1 tablet (0.4 mg total) under the tongue every 5 (five) minutes as needed.  25 tablet  3   No current facility-administered medications for this visit.    Discontinued Meds:   There are no discontinued medications.  Patient Active Problem List   Diagnosis Date Noted  . Presence of Bare Metal stent in LAD  coronary artery 05/20/2014  . Hyperlipidemia   . Normocytic anemia   . CAD (coronary artery disease)   . Acute coronary syndrome 05/19/2014  . MI, old 05/19/2014  . Cocaine abuse 05/19/2014  . Tobacco abuse 05/19/2014  . Acute myocardial infarction involving left coronary artery 05/19/2014  . CAD S/P percutaneous coronary angioplasty 12/25/2010    LABS    Component Value Date/Time   NA 142 05/20/2014 0750   NA 141 05/19/2014 1350   NA 140 05/19/2014 0849   K 4.1 05/20/2014 0750   K 3.6* 05/19/2014 1350   K 3.4* 05/19/2014 0849   CL 106 05/20/2014 0750   CL 103 05/19/2014 1350   CL 106 05/19/2014 0849   CO2 22 05/20/2014 0750   CO2 25 05/19/2014 1350   CO2 28 10/10/2008 1350   GLUCOSE 88 05/20/2014 0750   GLUCOSE 118* 05/19/2014 1350   GLUCOSE 127* 05/19/2014 0849   BUN 9 05/20/2014 0750   BUN 9 05/19/2014 1350   BUN 10 05/19/2014 0849   CREATININE 1.10 05/20/2014 0750   CREATININE 1.11 05/19/2014 1350   CREATININE 1.10 05/19/2014 0849  CALCIUM 9.0 05/20/2014 0750   CALCIUM 9.2 05/19/2014 1350   CALCIUM 9.5 10/10/2008 1350   GFRNONAA 78* 05/20/2014 0750   GFRNONAA 77* 05/19/2014 1350   GFRNONAA >60 10/10/2008 1350   GFRAA >90 05/20/2014 0750   GFRAA 90* 05/19/2014 1350   GFRAA  Value: >60        The eGFR has been calculated using the MDRD equation. This calculation has not been validated in all clinical situations. eGFR's persistently <60 mL/min signify possible Chronic Kidney Disease. 10/10/2008 1350   CMP     Component Value Date/Time   NA 142 05/20/2014 0750   K 4.1 05/20/2014 0750   CL 106 05/20/2014 0750   CO2 22 05/20/2014 0750   GLUCOSE 88 05/20/2014 0750   BUN 9 05/20/2014 0750   CREATININE 1.10 05/20/2014 0750   CALCIUM 9.0 05/20/2014 0750   PROT 7.5 05/19/2014 1350   ALBUMIN 3.8 05/19/2014 1350   AST 32 05/19/2014 1350   ALT 19 05/19/2014 1350   ALKPHOS 89 05/19/2014 1350   BILITOT 0.3 05/19/2014 1350   GFRNONAA 78* 05/20/2014 0750   GFRAA >90  05/20/2014 0750       Component Value Date/Time   WBC 7.6 05/20/2014 0750   WBC  Value: 10.1 CORRECTED ON 03/08 AT 1423: PREVIOUSLY REPORTED AS 10.2 10/10/2008 1350   WBC 9.8 08/01/2008 1631   HGB 14.5 05/20/2014 0750   HGB 15.3 05/19/2014 0849   HGB  Value: 14.7 CORRECTED ON 03/08 AT 1423: PREVIOUSLY REPORTED AS 14.6 10/10/2008 1350   HCT 42.0 05/20/2014 0750   HCT 45.0 05/19/2014 0849   HCT  Value: 43.5 CORRECTED ON 03/08 AT 1423: PREVIOUSLY REPORTED AS 44.2 10/10/2008 1350   MCV 83.5 05/20/2014 0750   MCV  Value: 85.3 CORRECTED ON 03/08 AT 1423: PREVIOUSLY REPORTED AS 84.8 10/10/2008 1350   MCV 84.7 08/01/2008 1631    Lipid Panel     Component Value Date/Time   CHOL 159 12/25/2010 1229   TRIG 118 12/25/2010 1229   HDL 37* 12/25/2010 1229   CHOLHDL 4.3 12/25/2010 1229   VLDL 24 12/25/2010 1229   LDLCALC 98 12/25/2010 1229    ABG    Component Value Date/Time   TCO2 23 05/19/2014 0849     Lab Results  Component Value Date   TSH 2.940 05/19/2014   BNP (last 3 results) No results found for this basename: PROBNP,  in the last 8760 hours Cardiac Panel (last 3 results) No results found for this basename: CKTOTAL, CKMB, TROPONINI, RELINDX,  in the last 72 hours  Iron/TIBC/Ferritin/ %Sat No results found for this basename: iron, tibc, ferritin, ironpctsat     EKG Orders placed during the hospital encounter of 05/19/14  . EKG 12-LEAD  . EKG 12-LEAD  . EKG 12-LEAD  . EKG 12-LEAD     Prior Assessment and Plan Problem List as of 05/30/2014     Cardiovascular and Mediastinum   CAD S/P percutaneous coronary angioplasty   Last Assessment & Plan   01/14/2011 Office Visit Written 01/14/2011 12:01 PM by Lendon Colonel, NP     Review of stress myoview demonstrated Modest hypertensive response to exercise, and DOE which did not limit  his exercise. Perfusion imaging shows evidence of dense infeioposterior scar with no large areas of residual ischemia. EF is reduced at 38% with inferior  akinesis.  This has been explained to the patient who seems disappointed that nothing more was found.  He states that he cannot work because  of his shortness of breath and chest pressure. I have advised him to quit smoking.  He is hoping to get disability for this.  I do not recommend disability from a cardiac standpoint, but have referred him to pulmonologist for further treatment.  He is hoping he can get disability from that practice. I have advised him that smoking is a major factor for his dyspnea and may not be eligible for disability if he continues to smoke with diagnosis for COPD.     Acute coronary syndrome   MI, old   Acute myocardial infarction involving left coronary artery   CAD (coronary artery disease)     Other   Cocaine abuse   Tobacco abuse   Presence of Bare Metal stent in LAD coronary artery   Hyperlipidemia   Normocytic anemia       Imaging: Dg Chest Port 1 View  05/19/2014   CLINICAL DATA:  48 year old male presenting with recent history of chest pain.  EXAM: PORTABLE CHEST - 1 VIEW  COMPARISON:  Chest x-ray 12/15/2006.  FINDINGS: There is a hazy opacity in the perihilar aspect of the left mid to lower lung. Lungs otherwise appear clear. No pleural effusions. No evidence of pulmonary edema. Heart size is normal. Upper mediastinal contours are unremarkable. No pneumothorax. No definite suspicious appearing pulmonary nodule or mass.  IMPRESSION: 1. Hazy perihilar opacity likely within the left lower lobe, concerning for early developing infection. Repeat standing PA and lateral chest radiographs in 2-3 weeks following appropriate trial of antimicrobial therapy are recommended to ensure resolution of this finding.   Electronically Signed   By: Vinnie Langton M.D.   On: 05/19/2014 09:23

## 2014-05-30 NOTE — Assessment & Plan Note (Signed)
This gentleman is at high-risk for recurrence of MI in the setting of ongoing cocaine abuse, medical noncompliance. He is not taking any of his medications with the exception of Effient. This is provided for him for free at the hospital. He brings with him his prescriptions for atorvastatin, lisinopril, and Plavix. He has not need to be taking both Plavix and Effient at this  time. He is to finish the Effeint and begin Plavix 75 mg daily thereafter. I have talked with him at length about taking his medications as directed. Filling them. He is going to go by the health Department to have them filled. He is not on a beta blocker due to cocaine use.

## 2014-05-30 NOTE — Patient Instructions (Addendum)
Your physician recommends that you schedule a follow-up appointment in: 3 months  Your physician recommends that you continue on your current medications as directed. Please refer to the Current Medication list given to you today.  I have sent your medications to [facility] in [place]  Thank you for choosing [facility]!!

## 2014-07-08 ENCOUNTER — Telehealth: Payer: Self-pay | Admitting: *Deleted

## 2014-07-08 NOTE — Telephone Encounter (Signed)
Pt is taking lisinopril 5 mg ,reassured pt he is on correct dose

## 2014-07-08 NOTE — Telephone Encounter (Signed)
Pt thinks he is on 2.5 mg of lisinopril and it was called in for 5 mg

## 2014-07-14 ENCOUNTER — Encounter (HOSPITAL_COMMUNITY): Payer: Self-pay | Admitting: Cardiology

## 2014-08-30 ENCOUNTER — Encounter: Payer: Self-pay | Admitting: *Deleted

## 2014-08-30 ENCOUNTER — Ambulatory Visit: Payer: Self-pay | Admitting: Cardiovascular Disease

## 2014-12-14 ENCOUNTER — Telehealth: Payer: Self-pay | Admitting: Adult Health

## 2014-12-14 NOTE — Telephone Encounter (Signed)
Will forward to Kathryn Lawrence, N.P.  

## 2014-12-14 NOTE — Telephone Encounter (Signed)
Patient would like a note stating that he is unable to work due to his heart and lung condition to take to social services to [employer]

## 2014-12-16 NOTE — Telephone Encounter (Signed)
Patient called back stating that he needs letter stating he can not work and needs to keep his food stamps. Patient was told to have case worker call office for information.

## 2014-12-16 NOTE — Telephone Encounter (Signed)
I spoke with patient and will will bring necessary paperwork to offfice.

## 2014-12-16 NOTE — Telephone Encounter (Signed)
He will need to provide paperwork for disability to allow [place] to do this.  A note will not be enough. He should see social security office and PCP first.

## 2014-12-29 NOTE — Telephone Encounter (Signed)
Spoke with patient. States that his case worker will send him a letter and he will bring to our office.

## 2015-06-24 ENCOUNTER — Encounter (HOSPITAL_COMMUNITY): Payer: Self-pay | Admitting: *Deleted

## 2015-06-24 ENCOUNTER — Emergency Department (HOSPITAL_COMMUNITY)
Admission: EM | Admit: 2015-06-24 | Discharge: 2015-06-24 | Disposition: A | Discharge status: Home / Self Care | Payer: Self-pay | Attending: Emergency Medicine | Admitting: Emergency Medicine

## 2015-06-24 DIAGNOSIS — Z7982 Long term (current) use of aspirin: Secondary | ICD-10-CM | POA: Insufficient documentation

## 2015-06-24 DIAGNOSIS — Z79899 Other long term (current) drug therapy: Secondary | ICD-10-CM | POA: Insufficient documentation

## 2015-06-24 DIAGNOSIS — E785 Hyperlipidemia, unspecified: Secondary | ICD-10-CM | POA: Insufficient documentation

## 2015-06-24 DIAGNOSIS — K029 Dental caries, unspecified: Secondary | ICD-10-CM | POA: Insufficient documentation

## 2015-06-24 DIAGNOSIS — K0889 Other specified disorders of teeth and supporting structures: Secondary | ICD-10-CM

## 2015-06-24 DIAGNOSIS — I2510 Atherosclerotic heart disease of native coronary artery without angina pectoris: Secondary | ICD-10-CM | POA: Insufficient documentation

## 2015-06-24 DIAGNOSIS — Z9861 Coronary angioplasty status: Secondary | ICD-10-CM | POA: Insufficient documentation

## 2015-06-24 DIAGNOSIS — R454 Irritability and anger: Secondary | ICD-10-CM | POA: Insufficient documentation

## 2015-06-24 DIAGNOSIS — I252 Old myocardial infarction: Secondary | ICD-10-CM | POA: Insufficient documentation

## 2015-06-24 DIAGNOSIS — Z9889 Other specified postprocedural states: Secondary | ICD-10-CM | POA: Insufficient documentation

## 2015-06-24 DIAGNOSIS — R4789 Other speech disturbances: Secondary | ICD-10-CM | POA: Insufficient documentation

## 2015-06-24 DIAGNOSIS — Z862 Personal history of diseases of the blood and blood-forming organs and certain disorders involving the immune mechanism: Secondary | ICD-10-CM | POA: Insufficient documentation

## 2015-06-24 DIAGNOSIS — F17210 Nicotine dependence, cigarettes, uncomplicated: Secondary | ICD-10-CM | POA: Insufficient documentation

## 2015-06-24 DIAGNOSIS — Z7902 Long term (current) use of antithrombotics/antiplatelets: Secondary | ICD-10-CM | POA: Insufficient documentation

## 2015-06-24 MED ORDER — PENICILLIN V POTASSIUM 500 MG PO TABS: 500.0000 mg | ORAL_TABLET | Freq: Four times a day (QID) | ORAL | Status: AC

## 2015-06-24 MED ORDER — BUPIVACAINE HCL (PF) 0.5 % IJ SOLN
10.0000 mL | Freq: Once | INTRAMUSCULAR | Status: DC
  Filled 2015-06-24: qty 30

## 2015-06-24 NOTE — Discharge Instructions (Signed)
°Emergency Department Resource Guide °1) Find a Doctor and Pay Out of Pocket °Although you won't have to find out who is covered by your insurance plan, it is a good idea to ask around and get recommendations. You will then need to call the office and see if the doctor you have chosen will accept you as a new patient and what types of options they offer for patients who are self-pay. Some doctors offer discounts or will set up payment plans for their patients who do not have insurance, but you will need to ask so you aren't surprised when you get to your appointment. ° °2) Contact Your Local Health Department °Not all health departments have doctors that can see patients for sick visits, but many do, so it is worth a call to see if yours does. If you don't know where your local health department is, you can check in your phone book. The CDC also has a tool to help you locate your state's health department, and many state websites also have listings of all of their local health departments. ° °3) Find a Walk-in Clinic °If your illness is not likely to be very severe or complicated, you may want to try a walk in clinic. These are popping up all over the country in pharmacies, drugstores, and shopping centers. They're usually staffed by nurse practitioners or physician assistants that have been trained to treat common illnesses and complaints. They're usually fairly quick and inexpensive. However, if you have serious medical issues or chronic medical problems, these are probably not your best option. ° °No Primary Care Doctor: °- Call [facility] Connect at  832-8000 - they can help you locate a primary care doctor that  accepts your insurance, provides certain services, etc. °- Physician Referral Service- 1-800-533-3463 ° °Chronic Pain Problems: °Organization         Address  Phone   Notes  °[facility]  (336) 297-2271 Patients need to be referred by their primary care doctor.  ° °Medication  Assistance: °Organization         Address  Phone   Notes  °[place] [facility] [address] °[place], [place] [address] (336) 641-8030 --Must be a resident of [place] °-- Must have NO insurance coverage whatsoever (no Medicaid/ Medicare, etc.) °-- The pt. MUST have a primary care doctor that directs their care regularly and follows them in the community °  °[facility]  (866) 331-1348   °[employer] Way  (888) 892-1162   ° °Agencies that provide inexpensive medical care: °Organization         Address  Phone   Notes  °[facility]  (336) 832-8035   °[facility]    (336) 832-7272   °[facility] [address] °[place], [place] [address] (336) 832-4777   °[facility] [address], °[place] (336) 271-4999   °[facility]    (336) 373-0678   °[facility]    (336) 272-1050   °[facility] ° [address], [place] Phone:  (336) 832-4444, Fax:  (336) 832-4440 Hours of Operation:  9 am - 6 pm, M-F.  Also accepts Medicaid/Medicare and self-pay.  °[facility] ° [address], [place] Phone: (336) [phone], Fax: (336) [phone]. Hours of Operation:  8:30 am - 5:30 pm, M-F.  Also accepts Medicaid and self-pay.  °[facility] [place] [address], [place] Phone: (336) 878-6027   °[facility] [address], [place], [place] (336)723-1848, Ext. 123 Mondays & Thursdays: 7-9 AM.  First 15 patients are seen on a first come, first serve basis. °  ° °Medicaid-accepting [place] Providers: ° °Organization         Address  Phone   Notes  °[facility] [address], [place] (336) 641-2100 Also accepts self-pay patients.  °[facility] [address], [place] ° (336) 856-9996   °[facility] [address], [place]  (336) 288-8857   °[facility] [address] [place] [address], [place] (336) 299-7000   °Veita Bland [address], [place]  ° (336) 373-1557 Only accepts [place] Access Medicaid patients after they have their name applied to their card.  ° °Self-Pay (no insurance) in [place]: ° °Organization         Address  Phone   Notes  °Sickle Cell Patients, [facility] [address], [place] (336) 832-1970   °[facility] [address], [place] (336) 832-4400   °[facility] [place] ° [address], [place] (336) 992-4800   °[facility]/Dr. Osei-Bonsu ° [address] [place] [address], [place] or [address], [place] (336) 841-8500 Phone number for both [place] and [place] locations is the same.  °[facility] [address], [place] (336) 299-0000   °[facility] [address] [place] [address], [place] or [address] (336) 852-7530 °(336) 878-2260   °[facility] [address], [place] (336) 350-1642, phone; (336) 294-5005, fax Sees patients 1st and 3rd Saturday of every month.  Must not qualify for public or private insurance (i.e. Medicaid, Medicare, [place] Health Choice, Veterans' Benefits) • Household income should be no more than 200% of the poverty level •The clinic cannot treat you if you are pregnant or think you are pregnant • Sexually transmitted diseases are not treated at the clinic.  ° ° °Dental Care: °Organization         Address  Phone  Notes  °[place] [facility] [address], [place] (336) 641-6152 Accepts children up to age 21 who are enrolled in Medicaid or [place] Health Choice; pregnant women with a Medicaid card; and children who have applied for Medicaid or [place] Health Choice, but were declined, whose parents can pay a reduced fee at time of service.  °[place]  [facility] [place]  [address], [place] (336) 641-7733 Accepts children up to age 21 who are enrolled in Medicaid or [place] Health Choice; pregnant women with a Medicaid card; and children who have applied for Medicaid or [place] Health Choice, but were declined, whose parents can pay a reduced fee at time of service.  °[facility] ° [address], [place] (336) 641-4533 Patients are seen by appointment only. Walk-ins are not accepted. [facility] will see patients 18 years of age and older. °Monday - Tuesday (8am-5pm) °Most Wednesdays (8:30-5pm) °$30 per visit, cash only  °[facility] ° [address], [place] (336) 641-4533 Patients are seen by appointment only. Walk-ins are not accepted. [facility] will see patients 18 years of age and older. °One   Wednesday Evening (Monthly: Volunteer Based).  $30 per visit, cash only  °[facility]  (919) 537-3737 for adults; Children under age 4, call Graduate Pediatric Dentistry at (919) [phone]. Children aged 4-14, please call (919) [phone] to request a pediatric application. ° Dental services are provided in all areas of dental care including fillings, crowns and bridges, complete and partial dentures, implants, gum treatment, root canals, and extractions. Preventive care is also provided. Treatment is provided to both adults and children. °Patients are selected via a lottery and there is often a waiting list. °  °[facility] [address], °[place] ° (336) 763-8833 [email or web address] °  °[facility] [address], [place], [place] (336)723-1848, Ext. 123 Second and Fourth Thursday of each month, opens at 6:30 AM; Clinic ends at 9 AM.  Patients are seen on a first-come first-served basis, and a limited number are seen during each clinic.  ° °[facility] ° [address], [place], [place] (336) 723-7904    Eligibility Requirements °You must have lived in [place], [place], or [place] counties for at least the last three months. °  You cannot be eligible for state or federal sponsored healthcare insurance, including Veterans Administration, Medicaid, or Medicare. °  You generally cannot be eligible for healthcare insurance through your employer.  °  How to apply: °Eligibility screenings are held every Tuesday and Wednesday afternoon from 1:00 pm until 4:00 pm. You do not need an appointment for the interview!  °[facility] [address], [place], [place] 336-631-2330   °[place] [facility]  336-342-8273   °[facility]  336-703-3100   °[facility]  336-570-6415   ° °Behavioral Health Resources in the Community: °Intensive Outpatient Programs °Organization         Address  Phone  Notes  °[facility] [address], [place], [place] 336-878-6098   °[facility] [address], [place], [place] 336-832-9800   °[facility] [address], [place], [place] ° 336-882-2125   °[facility] [address],  °[place], [place] 1-800-853-5163 or 336-641-4981   °Substance Abuse Resources °Organization         Address  Phone  Notes  °[facility]  336-882-2125   °[facility]  336-784-9470   °[facility]  336-285-9073   °[facility]  336-845-3988   °Residential & Outpatient Substance Abuse Program  1-800-659-3381   °Psychological Services °Organization         Address  Phone  Notes  °[facility]  336- 832-9600   °[facility]  336- 378-7881   °[facility] [address], [place] 1-800-853-5163 or 336-641-4981   ° °Mobile Crisis Teams °Organization         Address  Phone  Notes  °[facility], Mobile Crisis Care Unit  1-877-626-1772   °Assertive °[facility] ° [address]  [place], [place] 336-834-9664   °Sharon DeEsch [address] °[place] 336-554-5454   ° °Self-Help/Support Groups °Organization         Address  Phone             Notes  °[facility] [place] - variety of support groups  336- 373-1402 Call for more information  °Narcotics Anonymous (NA), [facility] [address], °[place]  2 meetings at this location  ° °  Residential Treatment Programs °Organization         Address  Phone  Notes  °[facility] [address],    °[place]  1-866-801-8205   °[facility] ° [address], [place], [place] 704-293-8524   °[facility] [address], [place] 336-845-3988 Admissions: 8am-3pm M-F  °[facility] [address]    °[place], [place] 336-841-1104   °[facility] [address], [place], [place] 336-379-7146   °[facility] [address]  °[place], [place] 336-285-9073   °[facility] Intensive Outpatient [address], [place], [place] 336-852-3033   °[facility] [address]  °[place], [place] 1-877-615-2722 or 336-784-9470   °[facility] [address] [place], [place] 336-227-7417 Accepts Medicaid  °[facility] [address]  °[place] 1-800-659-3381 Substance Abuse/Addiction Treatment  ° °[place] Behavioral Health Resources °Organization         Address  Phone  Notes  °[facility]  (888) 581-9988   °Julie Brannon, PhD [address] [place], [place]   (336) 349-5553 or (336) 951-0000   °[facility]   [address] °[place], [place] (336) 349-4454   °[facility] [address], [place], [place] (336) 342-8316 Insurance/Medicaid/sponsorship through [facility]  °[facility] [address]                                    [place], [place] (336) 342-8316 Therapy/tele-psych/case    °[facility] [address]  ° [place], [place] (336) 349-2233    °Dr. Arfeen  (336) 349-4544   °[facility] [place]  [employer] [place] [facility] 1) [address], [place] °2) [address], [place] °3)  [address], [place] (336) 349-3220 °(336) 342-7768 ° °(336) 342-8140   °[place] Child Abuse Hotline (336) 342-1394 or (336) 342-3537 (After Hours)    ° ° °

## 2015-06-24 NOTE — ED Provider Notes (Signed)
CSN: [id number]     Arrival date & time 06/24/15  4098 History  By signing my name below, I, [facility], attest that this documentation has been prepared under the direction and in the presence of Raeford Razor, MD. Electronically Signed: Randell Patient, ED Scribe. 06/24/2015. 10:40 AM.    Chief Complaint  Patient presents with  . Dental Pain   The history is provided by the patient. No language interpreter was used.   HPI Comments: Brian Ford is a 49 y.o. male who presents to the Emergency Department complaining of constant, moderate lower frontal dental pain onset 1 month ago that worsened last night. Patient reports difficult speech production secondary to pain. He has taken 2x Advil and 4x Aleve since last night with no relief. Patient states that he previously made an appointment with a dentist but was unable to follow-up due to lack of insurance. Patient denies fever, chills, and difficulty swallowing. NAD.     Past Medical History  Diagnosis Date  . ST elevation myocardial infarction (STEMI) of inferior wall (HCC) 2012  . CAD (coronary artery disease) 2012    a. PCI of 100% RCA (2012) b. s/p BMS to LAD  . Hyperlipidemia   . Crack cocaine use   . Tobacco abuse   . Normocytic anemia    Past Surgical History  Procedure Laterality Date  . Coronary angioplasty    . Left heart catheterization with coronary angiogram N/A 05/19/2014    Procedure: LEFT HEART CATHETERIZATION WITH CORONARY ANGIOGRAM;  Surgeon: Marykay Lex, MD;  Location: [facility] CATH LAB;  Service: Cardiovascular;  Laterality: N/A;  . Percutaneous coronary stent intervention (pci-s)  05/19/2014    Procedure: PERCUTANEOUS CORONARY STENT INTERVENTION (PCI-S);  Surgeon: Marykay Lex, MD;  Location: [facility] CATH LAB;  Service: Cardiovascular;;  BMS to Mid LAD   No family history on file. Social History  Substance Use Topics  . Smoking status: Current Every Day Smoker -- 1.00 packs/day for 25 years   Types: Cigarettes  . Smokeless tobacco: Never Used  . Alcohol Use: No    Review of Systems A complete 10 system review of systems was obtained and all systems are negative except as noted in the HPI and PMH.     Allergies  Review of patient's allergies indicates no known allergies.  Home Medications   Prior to Admission medications   Medication Sig Start Date End Date Taking? Authorizing Provider  aspirin EC 81 MG tablet Take 2 tablets (162 mg total) by mouth once. 05/30/14   Jodelle Gross, NP  atorvastatin (LIPITOR) 40 MG tablet Take 1 tablet (40 mg total) by mouth daily. 05/30/14   Jodelle Gross, NP  atorvastatin (LIPITOR) 40 MG tablet Take 1 tablet (40 mg total) by mouth daily. 05/30/14   Jodelle Gross, NP  calcium carbonate (TUMS EX) 750 MG chewable tablet Chew 1-2 tablets by mouth 4 (four) times daily as needed for heartburn.    Historical Provider, MD  clopidogrel (PLAVIX) 75 MG tablet Take 1 tablet (75 mg total) by mouth daily. 06/19/14   Jodelle Gross, NP  clopidogrel (PLAVIX) 75 MG tablet Take 1 tablet (75 mg total) by mouth daily. 06/19/14   Jodelle Gross, NP  lisinopril (PRINIVIL,ZESTRIL) 5 MG tablet Take 1 tablet (5 mg total) by mouth daily. 05/30/14   Jodelle Gross, NP  lisinopril (PRINIVIL,ZESTRIL) 5 MG tablet Take 1 tablet (5 mg total) by mouth daily. 05/30/14   Jodelle Gross, NP  nitroGLYCERIN (NITROSTAT) 0.4 MG SL tablet Place 1 tablet (0.4 mg total) under the tongue every 5 (five) minutes as needed. 05/30/14   Jodelle Gross, NP  omeprazole (PRILOSEC) 20 MG capsule Take 20 mg by mouth daily.    Historical Provider, MD   BP 143/98 mmHg  Pulse 85  Temp(Src) 97.7 F (36.5 C) (Oral)  Resp 18  Ht 5\' 8"  (1.727 m)  Wt 160 lb (72.576 kg)  BMI 24.33 kg/m2  SpO2 97% Physical Exam  Constitutional: He is oriented to person, place, and time. He appears well-developed and well-nourished. No distress.  HENT:  Head: Normocephalic and  atraumatic.  Generally poor dentition. Multiple caries. Tenderness to percussion to the right lower central incisor. No drainable collection. No facial swelling. Submental tissues soft.  Normal sounding voice.  Neck supple. Handling secretions.    Eyes: Conjunctivae and EOM are normal.  Neck: Neck supple. No tracheal deviation present.  Cardiovascular: Normal rate.   Pulmonary/Chest: Effort normal. No respiratory distress.  Musculoskeletal: Normal range of motion.  Neurological: He is alert and oriented to person, place, and time.  Skin: Skin is warm and dry.  Psychiatric: He has a normal mood and affect. His behavior is normal.  Nursing note and vitals reviewed.   ED Course  Procedures   DIAGNOSTIC STUDIES: Oxygen Saturation is 97% on RA, normal by my interpretation.    COORDINATION OF CARE: 10:07 AM Will apply dental block. Advised patient to follow-up with dentist. Will prescribe antibiotics. Discussed treatment plan with pt at bedside and pt agreed to plan.   Labs Review Labs Reviewed - No data to display  Imaging Review No results found. I have personally reviewed and evaluated these images and lab results as part of my medical decision-making.   EKG Interpretation None      MDM   Final diagnoses:  Toothache  Excessive anger    10:43 AM Pt with completely inappropriate behavior. Pacing outside room. Yelling/swearing. Reasonable requests for him to lower voice and stay in room ignored. I told him he needed to leave the ED. He does not have an emergent medical condition and his abusive behavior towards staff and disregard for other patients in the emergency room will not be tolerated. Initally discussed dental block but not going to attempt on an uncooperative patient. Grabbed DC papers/prescription from my hands prior to walking out.   I personally preformed the services scribed in my presence. The recorded information has been reviewed is accurate. Raeford Razor, MD.    Raeford Razor, MD 06/24/15 [phone]

## 2015-06-24 NOTE — ED Notes (Signed)
MD Kohut with patient. Pt cursing at MD. Pt snatched d/c papers from MD Kohut and walked out. Security present.

## 2015-06-24 NOTE — ED Notes (Signed)
Pt comes in with dental pain localized to his front lower teeth. Pt states pain has been there for 1 month with worsening in the last day.

## 2015-06-24 NOTE — ED Notes (Signed)
Marcaine placed at bedside.

## 2015-06-24 NOTE — ED Notes (Signed)
Pt came out into hallway yelling at staff. Pt became extremely belligerent, yelling obscenities to staff. Pt instructed to return to room by Consulting civil engineer. Pt informed this behavior is not tolerated. Security paged.

## 2015-06-24 NOTE — ED Notes (Addendum)
Pt called out requesting pain medication stating, "I have heart condition, someone needs to give me pain medicine. It's not good for me to sit back here in pain." Pt informed that pain medication could not be given until evaluated by a provider. Pt became extremely rude stating, "they need to get back here quick." Pt proceeded to tell this RN to "leave the damn curtain open and leave." Pt refused to have curtain or door closed.

## 2015-06-24 NOTE — ED Notes (Signed)
MD Kohut at bedside. 

## 2015-06-24 NOTE — ED Notes (Addendum)
Pt calling out to PA [facility] as he was walking by requesting pain medication. PA Beverely Pace informed patient that he would be seen as soon as possible.

## 2016-05-06 ENCOUNTER — Emergency Department (HOSPITAL_COMMUNITY)
Admission: EM | Admit: 2016-05-06 | Discharge: 2016-05-06 | Disposition: A | Discharge status: Home / Self Care | Payer: Self-pay | Attending: Emergency Medicine | Admitting: Emergency Medicine

## 2016-05-06 ENCOUNTER — Encounter (HOSPITAL_COMMUNITY): Payer: Self-pay

## 2016-05-06 ENCOUNTER — Emergency Department (HOSPITAL_COMMUNITY): Payer: Self-pay

## 2016-05-06 DIAGNOSIS — Y999 Unspecified external cause status: Secondary | ICD-10-CM | POA: Insufficient documentation

## 2016-05-06 DIAGNOSIS — F17210 Nicotine dependence, cigarettes, uncomplicated: Secondary | ICD-10-CM | POA: Insufficient documentation

## 2016-05-06 DIAGNOSIS — Z79899 Other long term (current) drug therapy: Secondary | ICD-10-CM | POA: Insufficient documentation

## 2016-05-06 DIAGNOSIS — Y92410 Unspecified street and highway as the place of occurrence of the external cause: Secondary | ICD-10-CM | POA: Insufficient documentation

## 2016-05-06 DIAGNOSIS — M25511 Pain in right shoulder: Secondary | ICD-10-CM | POA: Insufficient documentation

## 2016-05-06 DIAGNOSIS — S39012A Strain of muscle, fascia and tendon of lower back, initial encounter: Secondary | ICD-10-CM | POA: Insufficient documentation

## 2016-05-06 DIAGNOSIS — Y9389 Activity, other specified: Secondary | ICD-10-CM | POA: Insufficient documentation

## 2016-05-06 DIAGNOSIS — I2510 Atherosclerotic heart disease of native coronary artery without angina pectoris: Secondary | ICD-10-CM | POA: Insufficient documentation

## 2016-05-06 DIAGNOSIS — Z7982 Long term (current) use of aspirin: Secondary | ICD-10-CM | POA: Insufficient documentation

## 2016-05-06 DIAGNOSIS — S92501A Displaced unspecified fracture of right lesser toe(s), initial encounter for closed fracture: Secondary | ICD-10-CM | POA: Insufficient documentation

## 2016-05-06 DIAGNOSIS — S92901A Unspecified fracture of right foot, initial encounter for closed fracture: Secondary | ICD-10-CM

## 2016-05-06 DIAGNOSIS — Z9861 Coronary angioplasty status: Secondary | ICD-10-CM | POA: Insufficient documentation

## 2016-05-06 MED ORDER — HYDROCODONE-ACETAMINOPHEN 5-325 MG PO TABS
1.0000 | ORAL_TABLET | Freq: Once | ORAL | Status: AC
  Administered 2016-05-06: 1 via ORAL
  Filled 2016-05-06: qty 1

## 2016-05-06 MED ORDER — HYDROCODONE-ACETAMINOPHEN 5-325 MG PO TABS: 1.0000 | ORAL_TABLET | ORAL | 0 refills | Status: DC | PRN

## 2016-05-06 MED ORDER — IBUPROFEN 600 MG PO TABS: 600.0000 mg | ORAL_TABLET | Freq: Four times a day (QID) | ORAL | 0 refills | Status: DC | PRN
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
## 2016-05-06 NOTE — ED Notes (Signed)
Pt called out asking for pain medication, pt informed he needs to be evaluated by provider first. Pt states "I'm sitting here in pain and I have a heart condition".
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
## 2016-05-06 NOTE — ED Notes (Signed)
PA Idol notified of pt's requests and behavior.
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
## 2016-05-06 NOTE — ED Notes (Signed)
Pt states "all you give me is Vicodin and it doesn't work". Pt informed he has the right to refuse any medication. Pt then comes out of room and states "My screaming might offend you but I don't like closed doors" Informed pt there are other pts in department and to please lower voice, pt states "they need to teach you nurses how to behave". Security paged.

## 2016-05-06 NOTE — ED Notes (Signed)
Pt ambulatory with walker to restroom at this time with steady and even gait.

## 2016-05-06 NOTE — Discharge Instructions (Signed)
You may take the hydrocodone prescribed for pain relief.  This will make you drowsy - do not drive within 4 hours of taking this medication. ° °

## 2016-09-03 ENCOUNTER — Encounter (HOSPITAL_COMMUNITY): Payer: Self-pay

## 2016-09-03 ENCOUNTER — Emergency Department (HOSPITAL_COMMUNITY)
Admission: EM | Admit: 2016-09-03 | Discharge: 2016-09-03 | Disposition: A | Discharge status: Home / Self Care | Payer: No Typology Code available for payment source | Attending: Emergency Medicine | Admitting: Emergency Medicine

## 2016-09-03 DIAGNOSIS — I2510 Atherosclerotic heart disease of native coronary artery without angina pectoris: Secondary | ICD-10-CM | POA: Insufficient documentation

## 2016-09-03 DIAGNOSIS — Z79899 Other long term (current) drug therapy: Secondary | ICD-10-CM | POA: Insufficient documentation

## 2016-09-03 DIAGNOSIS — Z791 Long term (current) use of non-steroidal anti-inflammatories (NSAID): Secondary | ICD-10-CM | POA: Insufficient documentation

## 2016-09-03 DIAGNOSIS — Z7982 Long term (current) use of aspirin: Secondary | ICD-10-CM | POA: Diagnosis not present

## 2016-09-03 DIAGNOSIS — Z5321 Procedure and treatment not carried out due to patient leaving prior to being seen by health care provider: Secondary | ICD-10-CM | POA: Diagnosis not present

## 2016-09-03 DIAGNOSIS — R079 Chest pain, unspecified: Secondary | ICD-10-CM | POA: Diagnosis present

## 2016-09-03 DIAGNOSIS — F17210 Nicotine dependence, cigarettes, uncomplicated: Secondary | ICD-10-CM | POA: Insufficient documentation

## 2016-09-03 NOTE — ED Notes (Signed)
Patient states that he does not want to see an MD or have any other tests done at this time because he does not want to run up a high bill.  Patient stated this to the MD.  MD states that if he wants to leave that is fine and if he wants to stay that he will be more than happy to evaluate him.
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
## 2017-05-27 ENCOUNTER — Emergency Department (HOSPITAL_COMMUNITY): Payer: Self-pay

## 2017-05-27 ENCOUNTER — Emergency Department (HOSPITAL_COMMUNITY)
Admission: EM | Admit: 2017-05-27 | Discharge: 2017-05-27 | Disposition: A | Discharge status: Home / Self Care | Payer: Self-pay | Attending: Emergency Medicine | Admitting: Emergency Medicine

## 2017-05-27 ENCOUNTER — Encounter (HOSPITAL_COMMUNITY): Payer: Self-pay | Admitting: *Deleted

## 2017-05-27 DIAGNOSIS — F17210 Nicotine dependence, cigarettes, uncomplicated: Secondary | ICD-10-CM | POA: Insufficient documentation

## 2017-05-27 DIAGNOSIS — R51 Headache: Secondary | ICD-10-CM | POA: Insufficient documentation

## 2017-05-27 DIAGNOSIS — R197 Diarrhea, unspecified: Secondary | ICD-10-CM | POA: Insufficient documentation

## 2017-05-27 DIAGNOSIS — R69 Illness, unspecified: Secondary | ICD-10-CM | POA: Insufficient documentation

## 2017-05-27 DIAGNOSIS — J111 Influenza due to unidentified influenza virus with other respiratory manifestations: Secondary | ICD-10-CM

## 2017-05-27 DIAGNOSIS — I2510 Atherosclerotic heart disease of native coronary artery without angina pectoris: Secondary | ICD-10-CM | POA: Insufficient documentation

## 2017-05-27 LAB — RAPID STREP SCREEN (MED CTR MEBANE ONLY): STREPTOCOCCUS, GROUP A SCREEN (DIRECT): NEGATIVE

## 2017-05-27 MED ORDER — IBUPROFEN 400 MG PO TABS
400.0000 mg | ORAL_TABLET | Freq: Once | ORAL | Status: AC
  Administered 2017-05-27: 400 mg via ORAL
  Filled 2017-05-27: qty 1

## 2017-05-27 NOTE — ED Provider Notes (Signed)
[facility] EMERGENCY DEPARTMENT Provider Note   CSN: [id number] Arrival date & time: 05/27/17  0451     History   Chief Complaint Chief Complaint  Patient presents with  . URI    HPI Brian Ford is a 51 y.o. male.  Patient presents to the ED as the patient's while his mother is being seen as well.  States he developed a sore throat about 24 hours ago progressed to body aches, chills, headache, nausea and some diarrhea.  He is concerned that he has the flu.  He Has not checked his temperature at home.  Patient with a history of CAD with stents.  States his mother has been sick at home as well and not been feeling well for the past 2 weeks.  Patient describes diffuse body aches, headache, sore throat, pain with swallowing, chills and nausea.  Denies cough.  Denies runny nose.  Has not had any vomiting.  Has had one episode of diarrhea.  No abdominal pain, chest pain or back pain.  Took DayQuil at home as well as naproxen with partial relief.   The history is provided by the patient.    Past Medical History:  Diagnosis Date  . CAD (coronary artery disease) 2012   a. PCI of 100% RCA (2012) b. s/p BMS to LAD  . Crack cocaine use   . Hyperlipidemia   . Normocytic anemia   . ST elevation myocardial infarction (STEMI) of inferior wall (HCC) 2012  . Tobacco abuse     Patient Active Problem List   Diagnosis Date Noted  . Presence of Bare Metal stent in LAD coronary artery 05/20/2014  . Hyperlipidemia   . Normocytic anemia   . CAD (coronary artery disease)   . Acute coronary syndrome (HCC) 05/19/2014  . MI, old 05/19/2014  . Cocaine abuse (HCC) 05/19/2014  . Tobacco abuse 05/19/2014  . Acute myocardial infarction involving left coronary artery (HCC) 05/19/2014  . CAD S/P percutaneous coronary angioplasty 12/25/2010    Past Surgical History:  Procedure Laterality Date  . CORONARY ANGIOPLASTY    . LEFT HEART CATHETERIZATION WITH CORONARY ANGIOGRAM N/A 05/19/2014   Procedure: LEFT HEART CATHETERIZATION WITH CORONARY ANGIOGRAM;  Surgeon: Marykay Lexavid W Harding, MD;  [facility];  Service: Cardiovascular;  Laterality: N/A;  . PERCUTANEOUS CORONARY STENT INTERVENTION (PCI-S)  05/19/2014   Procedure: PERCUTANEOUS CORONARY STENT INTERVENTION (PCI-S);  Surgeon: Marykay Lexavid W Harding, MD;  [facility];  Service: Cardiovascular;;  BMS to Mid LAD       Home Medications    Prior to Admission medications   Not on File    Family History History reviewed. No pertinent family history.  Social History Social History  Substance Use Topics  . Smoking status: Current Every Day Smoker    Packs/day: 1.00    Years: 25.00    Types: Cigarettes  . Smokeless tobacco: Never Used  . Alcohol use No     Allergies   Patient has no known allergies.   Review of Systems Review of Systems  Constitutional: Positive for activity change, appetite change, chills and fatigue. Negative for fever.  HENT: Positive for sore throat. Negative for congestion and rhinorrhea.   Eyes: Negative for visual disturbance.  Respiratory: Negative for cough, chest tightness and shortness of breath.   Cardiovascular: Negative for chest pain.  Gastrointestinal: Positive for diarrhea and nausea. Negative for abdominal pain and vomiting.  Genitourinary: Negative for dysuria, hematuria and urgency.  Musculoskeletal: Positive for arthralgias and  myalgias. Negative for back pain and neck pain.  Skin: Negative for rash.  Neurological: Positive for weakness and headaches. Negative for dizziness and light-headedness.    all other systems are negative except as noted in the HPI and PMH.    Physical Exam Updated Vital Signs BP 123/81   Pulse 68   Temp 98 F (36.7 C)   Resp 18   Ht 5\' 8"  (1.727 m)   Wt 70.3 kg (155 lb)   SpO2 100%   BMI 23.57 kg/m   Physical Exam  Constitutional: He is oriented to person, place, and time. He appears well-developed and well-nourished. No  distress.  HENT:  Head: Normocephalic and atraumatic.  Mouth/Throat: Oropharynx is clear and moist. No oropharyngeal exudate.  Erythema to oropharynx. No asymmetry. No exudates  Eyes: Pupils are equal, round, and reactive to light. Conjunctivae and EOM are normal.  Neck: Normal range of motion. Neck supple.  No meningismus.  Cardiovascular: Normal rate, regular rhythm, normal heart sounds and intact distal pulses.   No murmur heard. Pulmonary/Chest: Effort normal and breath sounds normal. No respiratory distress. He exhibits no tenderness.  Abdominal: Soft. There is no tenderness. There is no rebound and no guarding.  Musculoskeletal: Normal range of motion. He exhibits no edema or tenderness.  Neurological: He is alert and oriented to person, place, and time. No cranial nerve deficit. He exhibits normal muscle tone. Coordination normal.   5/5 strength throughout. CN 2-12 intact.Equal grip strength.   Skin: Skin is warm. Capillary refill takes less than 2 seconds.  Psychiatric: He has a normal mood and affect. His behavior is normal.  Nursing note and vitals reviewed.    ED Treatments / Results  Labs (all labs ordered are listed, but only abnormal results are displayed) Labs Reviewed  RAPID STREP SCREEN (NOT AT [facility])  CULTURE, GROUP A STREP [facility])    EKG  EKG Interpretation None       Radiology Dg Chest 2 View  Result Date: 05/27/2017 CLINICAL DATA:  Acute onset of sore throat. Body aches and chills. Initial encounter. EXAM: CHEST  2 VIEW COMPARISON:  Chest radiograph performed 05/19/2014 FINDINGS: The lungs are well-aerated. A vague 1.0 cm nodule is suggested near the right lung apex. There is no evidence of pleural effusion or pneumothorax. The heart is normal in size; the mediastinal contour is within normal limits. No acute osseous abnormalities are seen. IMPRESSION: Vague 1.0 cm nodule suggested near the right lung apex. CT of the chest could be considered for further  evaluation, on an elective nonemergent basis. Electronically Signed   By: Roanna Raider M.D.   On: 05/27/2017 06:19    Procedures Procedures (including critical care time)  Medications Ordered in ED Medications - No data to display   Initial Impression / Assessment and Plan / ED Course  I have reviewed the triage vital signs and the nursing notes.  Pertinent labs & imaging results that were available during my care of the patient were reviewed by me and considered in my medical decision making (see chart for details).    Patient with 1 day history of sore throat, body aches, headache, nausea, chills.  No fever.  Nontoxic-appearing.  Strep is negative.  Chest x-ray with possible right apex nodule.  Patient informed of same and need for outpatient CT.  Patient with influenza-like illness.  Discussed oral hydration at home, antipyretics.  Risks and benefits of Tamiflu discussed and declined by patient.  Follow-up with PCP.  Keep self  hydrated.  Return precautions discussed. Final Clinical Impressions(s) / ED Diagnoses   Final diagnoses:  Influenza-like illness    New Prescriptions New Prescriptions   No medications on file     Glynn Octave, MD 05/27/17 [phone]

## 2017-05-27 NOTE — ED Triage Notes (Signed)
Pt c/o having a sore throat yesterday and states he has been feeling worse with body aches and chills

## 2017-05-27 NOTE — Discharge Instructions (Signed)
Follow up with your primary doctor. You should have a CT scan to evaluate your abnormal chest xray and possible lung nodule. Use tylenol or ibuprofen as needed for aches and fever. Keep yourself hydrated. Return to the ED if you develop new or worsening symptoms.

## 2017-05-29 DIAGNOSIS — Z139 Encounter for screening, unspecified: Secondary | ICD-10-CM

## 2017-05-29 LAB — GLUCOSE, POCT (MANUAL RESULT ENTRY): POC Glucose: 114 mg/dl — AB (ref 70–99)

## 2017-05-29 LAB — CULTURE, GROUP A STREP (THRC)

## 2017-05-29 NOTE — Congregational Nurse Program (Signed)
Congregational Nurse Program Note  Date of Encounter: 05/29/2017  Past Medical History: Past Medical History:  Diagnosis Date  . CAD (coronary artery disease) 2012   a. PCI of 100% RCA (2012) b. s/p BMS to LAD  . Crack cocaine use   . Hyperlipidemia   . Normocytic anemia   . ST elevation myocardial infarction (STEMI) of inferior wall (HCC) 2012  . Tobacco abuse     Encounter Details:     CNP Questionnaire - 05/29/17 1621      Patient Demographics   Is this a new or existing patient? New   Patient is considered a/an Not Applicable   Race Caucasian/White     Patient Assistance   Location of Patient Assistance [facility]   Patient's financial/insurance status Low Income;Self-Pay (Uninsured)   Uninsured Patient ([employer]s) Yes   Interventions Counseled to make appt. with provider;Assisted patient in making appt.   Patient referred to apply for the following financial assistance [facility]   Food insecurities addressed Not Applicable   Transportation assistance No   Assistance securing medications No   Educational health offerings Behavioral health;Acute disease;Navigating the healthcare system;Safety     Encounter Details   Primary purpose of visit Post ED/Hospitalization Visit;Acute Illness/Condition Visit;Education/Health Concerns;Navigating the Healthcare System   Was an Emergency Department visit averted? Not Applicable   Does patient have a medical provider? No   Patient referred to Area Agency;Clinic;Establish PCP   Was a mental health screening completed? (GAINS tool) No   Does patient have dental issues? Yes   Was a dental referral made? No resources for a referral   Does patient have vision issues? No   Does your patient have an abnormal blood pressure today? No   Since previous encounter, have you referred patient for abnormal blood pressure that resulted in a new diagnosis or medication change? No   Does your patient have an  abnormal blood glucose today? No   Since previous encounter, have you referred patient for abnormal blood glucose that resulted in a new diagnosis or medication change? No   Was there a life-saving intervention made? No     New client to [facility]. He was recently seen and evaluated in [facility] ER with flu like symptoms. He states he started feeling badly on 05/25/17 with sore throat, body aches, chills and fatigue, he had nausea and no vomiting. He went to [facility] Emergency room on Tuesday 05/27/17 because he had taken his mother there due to a fall. He states he had no fever during that visit and they checked a rapid strep test that was negative. They did perform a chest xray and found a "lump" per client in his lung and told him he needed to follow up with his PCP for further evaluation. They gave him ibuprofen and told him to follow up with [facility]  He currently lives with his mother and with her social security bills are paid. Client has no income and no health insurance. [facility] but states it has been over a year since he was there. Today he states he is feeling better. Denies cough, except for a smokers cough. He reports his aches are less. He states he is still tired. He denies chills or fever. He does still report some sore throat.  Past medical history: Anxiety  Depression Myocardial infarction in 2007 and 2015 with coronary artery stenting.  Cocaine use  Surgical History:  Coronary catherization with coronary artery stenting 1914,[id number],2015  Current medications: None  Alert and oriented to person and place. Client states his main concerns today are getting a primary care provider and taking care of himself better. His lungs sounds are muffled due to layered clothing, client wears multiple clothing as he drives a scooter for transportation. O2 saturating 97% and client denies chest pains or shortness of breath. He denies cough and  temp is 98.0 orally. Denies nausea at present and denies abdominal pain. Abdomen soft and non tender. He does report occasional headaches. He complains with tooth pain, with multiple teeth broken off and requests dental care. Discussed with client regarding dental wait list and he states understanding. Client denies chills since Sunday and has only taken some dayquil a couple times. Denies thoughts of suicide today, however would like to seek treatment for his depression and anxiety and possibly outpatient substance abuse treatment. Client states [facility] and is interested again. Client given walk in times for intake with [facility], Monday thru Friday.  Client states that he has had a long history of substance abuse with crack cocaine. He reports that he smokes cocaine about three times a week currently. Discussed options for medical care and client [facility] [place] [facility]. Referral made and appointment secured for 06/02/17 at 10:45am Also discussed with client regarding the [facility] has a social work Psychologist, counsellingintern Zoe that he could talk to about further needs. Client reports interests in applying for medicaid and possibly disability, but states it gets overwhelming for him. He would also need supportive case management to follow up with connecting client to outpatient substance abuse treatment as well has mental health services. Client is agreeable and states its easier to meet with someone after his medical appointment . [facility] staff aware of referral to social work intern and RN will follow up with client and social work Tax inspectorintern.  Client given end of visit summary with appointment dates and times along with the walk in intake times for [facility].

## 2017-06-02 ENCOUNTER — Telehealth: Payer: Self-pay

## 2017-06-02 ENCOUNTER — Ambulatory Visit: Payer: Self-pay | Admitting: Physician Assistant

## 2017-06-02 NOTE — Telephone Encounter (Signed)
Pt calling to know his appointment with [facility]. Reminded pt of his appointment which is tomorrow 06/03/17 at 10:45. Pt also was able to find intake forms he took home when he was screened here at the [facility] on 05/29/17.    Bailee Thall R. Kameron Glazebrook LPN [phone]

## 2017-06-03 ENCOUNTER — Ambulatory Visit: Payer: Self-pay | Admitting: Physician Assistant

## 2017-06-03 ENCOUNTER — Encounter: Payer: Self-pay | Admitting: Physician Assistant

## 2017-06-03 VITALS — BP 120/70 | HR 72 | Temp 97.7°F | Ht 67.0 in | Wt 159.5 lb

## 2017-06-03 DIAGNOSIS — F17219 Nicotine dependence, cigarettes, with unspecified nicotine-induced disorders: Secondary | ICD-10-CM

## 2017-06-03 DIAGNOSIS — R9389 Abnormal findings on diagnostic imaging of other specified body structures: Secondary | ICD-10-CM

## 2017-06-03 DIAGNOSIS — R911 Solitary pulmonary nodule: Secondary | ICD-10-CM

## 2017-06-03 DIAGNOSIS — F39 Unspecified mood [affective] disorder: Secondary | ICD-10-CM

## 2017-06-03 DIAGNOSIS — R455 Hostility: Secondary | ICD-10-CM

## 2017-06-03 DIAGNOSIS — Z955 Presence of coronary angioplasty implant and graft: Secondary | ICD-10-CM

## 2017-06-03 DIAGNOSIS — I2510 Atherosclerotic heart disease of native coronary artery without angina pectoris: Secondary | ICD-10-CM

## 2017-06-03 DIAGNOSIS — F1410 Cocaine abuse, uncomplicated: Secondary | ICD-10-CM

## 2017-06-03 NOTE — Progress Notes (Signed)
BP 120/70 (BP Location: Left Arm, Patient Position: Sitting, Cuff Size: Normal)   Pulse 72   Temp 97.7 F (36.5 C)   Ht 5\' 7"  (1.702 m)   Wt 159 lb 8 oz (72.3 kg)   SpO2 98%   BMI 24.98 kg/m    Subjective:    Patient ID: Brian Ford, male    DOB: [birth year], 51 y.o.   MRN: [id number]  HPI: Brian Ford is a 51 y.o. male presenting on 06/03/2017 for New Patient (Initial Visit) and Callouses (pt states it has been getting worse in the last year. R foot)   HPI   Pt previously went to [facility].  He hasn't been there in 3 or 4 years for regular care.  He says he went last year just for HIV test.   While reviewing pt history, he became very angry and refused to answer drug and smoking questions.  He went off complaining about health care "throwing it in my face" and "left me there" after his MI because he used drugs.  Tried to explain to pt that social history is important to help direct appropriate testing and treatment.  He still refused to answer any questions about smoking, drugs, alcohol.  He did answer questions to nurse but lied, stating he had no history of drug use.    Pt denies CP.  States some sob and wheezing at times.   He had MI in the past (2008, 2012, 2015 and had PCI with stent placement in 2012.  He has not seen cardiologist since 2015 (per record review- pt too hostile to answer)    Pt had CXR on 05/27/17 in the ER that showed 1 cm nodule  Relevant past medical, surgical, family and social history reviewed and updated as indicated. Interim medical history since our last visit reviewed. Allergies and medications reviewed and updated.  No current outpatient prescriptions on file.   Review of Systems  Constitutional: Negative for appetite change, chills, diaphoresis, fatigue, fever and unexpected weight change.  HENT: Positive for congestion and dental problem. Negative for drooling, ear pain, facial swelling, hearing loss, mouth sores, sneezing, sore throat, trouble  swallowing and voice change.   Eyes: Negative for pain, discharge, redness, itching and visual disturbance.  Respiratory: Positive for shortness of breath and wheezing. Negative for cough and choking.   Cardiovascular: Negative for chest pain, palpitations and leg swelling.  Gastrointestinal: Negative for abdominal pain, blood in stool, constipation, diarrhea and vomiting.  Endocrine: Negative for cold intolerance, heat intolerance and polydipsia.  Genitourinary: Negative for decreased urine volume, dysuria and hematuria.  Musculoskeletal: Positive for arthralgias and back pain. Negative for gait problem.  Skin: Negative for rash.  Allergic/Immunologic: Negative for environmental allergies.  Neurological: Positive for light-headedness and headaches. Negative for seizures and syncope.  Hematological: Negative for adenopathy.  Psychiatric/Behavioral: Positive for dysphoric mood. Negative for agitation and suicidal ideas. The patient is nervous/anxious.     Per HPI unless specifically indicated above     Objective:    BP 120/70 (BP Location: Left Arm, Patient Position: Sitting, Cuff Size: Normal)   Pulse 72   Temp 97.7 F (36.5 C)   Ht 5\' 7"  (1.702 m)   Wt 159 lb 8 oz (72.3 kg)   SpO2 98%   BMI 24.98 kg/m   Wt Readings from Last 3 Encounters:  06/03/17 159 lb 8 oz (72.3 kg)  05/29/17 160 lb 9.6 oz (72.8 kg)  05/27/17 155 lb (70.3 kg)  Physical Exam  Constitutional: He is oriented to person, place, and time. He appears well-developed and well-nourished.  HENT:  Head: Normocephalic and atraumatic.  Cardiovascular: Normal rate, regular rhythm and normal heart sounds.   Pulmonary/Chest: Effort normal and breath sounds normal. No respiratory distress. He has no wheezes. He has no rales.  Neurological: He is alert and oriented to person, place, and time. He displays no tremor. Coordination and gait normal.  Skin: Skin is warm and dry.  Psychiatric: His affect is angry. His speech  is rapid and/or pressured. He is agitated and aggressive. Thought content is paranoid.  Nursing note and vitals reviewed.       Assessment & Plan:   Encounter Diagnoses  Name Primary?  . Coronary artery disease involving native coronary artery of native heart without angina pectoris Yes  . History of heart artery stent   . Cocaine abuse (HCC)   . Cigarette nicotine dependence with nicotine-induced disorder   . Solitary lung nodule   . Abnormal CXR   . Mood disorder (HCC)   . Hostility   . Solitary pulmonary nodule       -order chest CT to evaluate nodule on cxr.  -pt was given [employer] discount application -pt to get baseline labs drawn tomorrow morning while fasting  -will plan on colon cancer screening, referral to cardiology for monitoring, management of wheezing with inhalers, etc if he returns to office (he said at one point during today's office visit that he wasn't ever coming back here" -recommended follow up office visit in 1 month with RTO sooner prn

## 2017-06-05 ENCOUNTER — Encounter: Payer: Self-pay | Admitting: Physician Assistant

## 2017-06-05 NOTE — Progress Notes (Signed)
06/03/17: New patient. Applying for disability benefits for the third time, struggles with completing the process. Expressed history of depression and PTSD and how it impacts daily living, referred to [facility]. Taking care of mother, stroke victim.

## 2017-06-06 ENCOUNTER — Other Ambulatory Visit (HOSPITAL_COMMUNITY)
Admission: RE | Admit: 2017-06-06 | Discharge: 2017-06-06 | Disposition: A | Discharge status: Home / Self Care | Payer: No Typology Code available for payment source | Source: Ambulatory Visit | Attending: Physician Assistant | Admitting: Physician Assistant

## 2017-06-06 DIAGNOSIS — Z955 Presence of coronary angioplasty implant and graft: Secondary | ICD-10-CM | POA: Insufficient documentation

## 2017-06-06 DIAGNOSIS — I2510 Atherosclerotic heart disease of native coronary artery without angina pectoris: Secondary | ICD-10-CM | POA: Insufficient documentation

## 2017-06-06 LAB — COMPREHENSIVE METABOLIC PANEL
ALBUMIN: 4.2 g/dL (ref 3.5–5.0)
ALT: 18 U/L (ref 17–63)
ANION GAP: 8 (ref 5–15)
AST: 19 U/L (ref 15–41)
Alkaline Phosphatase: 80 U/L (ref 38–126)
BILIRUBIN TOTAL: 0.5 mg/dL (ref 0.3–1.2)
BUN: 19 mg/dL (ref 6–20)
CHLORIDE: 102 mmol/L (ref 101–111)
CO2: 28 mmol/L (ref 22–32)
Calcium: 9.1 mg/dL (ref 8.9–10.3)
Creatinine, Ser: 1.17 mg/dL (ref 0.61–1.24)
GFR calc Af Amer: >60 mL/min (ref >60)
GFR calc non Af Amer: >60 mL/min (ref >60)
GLUCOSE: 99 mg/dL (ref 65–99)
POTASSIUM: 3.9 mmol/L (ref 3.5–5.1)
Sodium: 138 mmol/L (ref 135–145)
TOTAL PROTEIN: 7.7 g/dL (ref 6.5–8.1)

## 2017-06-06 LAB — LIPID PANEL
Cholesterol: 182 mg/dL (ref 0–200)
HDL: 50 mg/dL (ref >40)
LDL CALC: 117 mg/dL — AB (ref 0–99)
TRIGLYCERIDES: 77 mg/dL (ref <150)
Total CHOL/HDL Ratio: 3.6 RATIO
VLDL: 15 mg/dL (ref 0–40)

## 2017-06-10 ENCOUNTER — Ambulatory Visit (HOSPITAL_COMMUNITY): Admission: RE | Admit: 2017-06-10 | Payer: Self-pay | Source: Ambulatory Visit

## 2017-07-01 ENCOUNTER — Ambulatory Visit (HOSPITAL_COMMUNITY)
Admission: RE | Admit: 2017-07-01 | Discharge: 2017-07-01 | Disposition: A | Discharge status: Home / Self Care | Payer: Self-pay | Source: Ambulatory Visit | Attending: Physician Assistant | Admitting: Physician Assistant

## 2017-07-01 DIAGNOSIS — I700 Atherosclerosis of aorta: Secondary | ICD-10-CM | POA: Insufficient documentation

## 2017-07-01 DIAGNOSIS — R918 Other nonspecific abnormal finding of lung field: Secondary | ICD-10-CM | POA: Insufficient documentation

## 2017-07-01 DIAGNOSIS — J432 Centrilobular emphysema: Secondary | ICD-10-CM | POA: Insufficient documentation

## 2017-07-01 DIAGNOSIS — I2510 Atherosclerotic heart disease of native coronary artery without angina pectoris: Secondary | ICD-10-CM | POA: Insufficient documentation

## 2017-07-09 ENCOUNTER — Ambulatory Visit: Payer: Self-pay | Admitting: Physician Assistant

## 2017-07-22 ENCOUNTER — Encounter: Payer: Self-pay | Admitting: Physician Assistant

## 2017-07-22 ENCOUNTER — Ambulatory Visit: Payer: Self-pay | Admitting: Physician Assistant

## 2017-07-22 VITALS — BP 118/76 | HR 79 | Temp 97.9°F | Ht 67.0 in | Wt 165.0 lb

## 2017-07-22 DIAGNOSIS — R9389 Abnormal findings on diagnostic imaging of other specified body structures: Secondary | ICD-10-CM

## 2017-07-22 DIAGNOSIS — K219 Gastro-esophageal reflux disease without esophagitis: Secondary | ICD-10-CM

## 2017-07-22 DIAGNOSIS — I2510 Atherosclerotic heart disease of native coronary artery without angina pectoris: Secondary | ICD-10-CM

## 2017-07-22 DIAGNOSIS — F39 Unspecified mood [affective] disorder: Secondary | ICD-10-CM

## 2017-07-22 DIAGNOSIS — F17219 Nicotine dependence, cigarettes, with unspecified nicotine-induced disorders: Secondary | ICD-10-CM

## 2017-07-22 DIAGNOSIS — K029 Dental caries, unspecified: Secondary | ICD-10-CM

## 2017-07-22 DIAGNOSIS — J439 Emphysema, unspecified: Secondary | ICD-10-CM

## 2017-07-22 DIAGNOSIS — R918 Other nonspecific abnormal finding of lung field: Secondary | ICD-10-CM

## 2017-07-22 DIAGNOSIS — I2584 Coronary atherosclerosis due to calcified coronary lesion: Secondary | ICD-10-CM

## 2017-07-22 DIAGNOSIS — I700 Atherosclerosis of aorta: Secondary | ICD-10-CM | POA: Insufficient documentation

## 2017-07-22 DIAGNOSIS — Z955 Presence of coronary angioplasty implant and graft: Secondary | ICD-10-CM

## 2017-07-22 MED ORDER — OMEPRAZOLE 40 MG PO CPDR: 40.0000 mg | DELAYED_RELEASE_CAPSULE | Freq: Every day | ORAL | 1 refills | Status: DC

## 2017-07-22 MED ORDER — ASPIRIN EC 81 MG PO TBEC: 81.0000 mg | DELAYED_RELEASE_TABLET | Freq: Every day | ORAL | Status: DC

## 2017-07-22 MED ORDER — SIMVASTATIN 20 MG PO TABS: 20.0000 mg | ORAL_TABLET | Freq: Every day | ORAL | 1 refills | Status: DC

## 2017-07-22 NOTE — Progress Notes (Signed)
BP 118/76 (BP Location: Left Arm, Patient Position: Sitting, Cuff Size: Normal)   Pulse 79   Temp 97.9 F (36.6 C) (Other (Comment))   Ht 5\' 7"  (1.702 m)   Wt 165 lb (74.8 kg)   SpO2 97%   BMI 25.84 kg/m    Subjective:    Patient ID: Brian Ford, male    DOB: 1965-09-02, 51 y.o.   MRN: [id number]  HPI: Brian Ford is a 51 y.o. male presenting on 07/22/2017 for Follow-up   HPI   -review of most recent cardiology office note, states that pt Needs to update echo for ischemic cardiopathy with previous EF 35-40 %  Per [facility] on 05/19/14-  -When last seen by cards in 2015, Pt desired disability and did not want compliance  -pt says he is Supposed to be taking cholesterol and heart burn meds and thinks he needs to get back on them.  He states he is having heartburn symptoms with occassional burning.   -Pt has not yet gotten his [facility] discount application turned in   -Pt wants disability for depression although he is not getting care and treatment for that anywhere at this time.   Relevant past medical, surgical, family and social history reviewed and updated as indicated. Interim medical history since our last visit reviewed. Allergies and medications reviewed and updated.  No current outpatient medications on file.   Review of Systems  Constitutional: Negative for appetite change, chills, diaphoresis, fatigue, fever and unexpected weight change.  HENT: Positive for dental problem. Negative for congestion, drooling, ear pain, facial swelling, hearing loss, mouth sores, sneezing, sore throat, trouble swallowing and voice change.   Eyes: Negative for pain, discharge, redness, itching and visual disturbance.  Respiratory: Negative for cough, choking, shortness of breath and wheezing.   Cardiovascular: Negative for chest pain, palpitations and leg swelling.  Gastrointestinal: Negative for abdominal pain, blood in stool, constipation, diarrhea and vomiting.  Endocrine: Negative  for cold intolerance, heat intolerance and polydipsia.  Genitourinary: Negative for decreased urine volume, dysuria and hematuria.  Musculoskeletal: Negative for arthralgias, back pain and gait problem.  Skin: Negative for rash.  Allergic/Immunologic: Negative for environmental allergies.  Neurological: Positive for headaches. Negative for seizures, syncope and light-headedness.  Hematological: Negative for adenopathy.  Psychiatric/Behavioral: Positive for agitation and dysphoric mood. Negative for suicidal ideas. The patient is nervous/anxious.     Per HPI unless specifically indicated above     Objective:    BP 118/76 (BP Location: Left Arm, Patient Position: Sitting, Cuff Size: Normal)   Pulse 79   Temp 97.9 F (36.6 C) (Other (Comment))   Ht 5\' 7"  (1.702 m)   Wt 165 lb (74.8 kg)   SpO2 97%   BMI 25.84 kg/m   Wt Readings from Last 3 Encounters:  07/22/17 165 lb (74.8 kg)  06/03/17 159 lb 8 oz (72.3 kg)  05/29/17 160 lb 9.6 oz (72.8 kg)    Physical Exam  Constitutional: He is oriented to person, place, and time. He appears well-developed and well-nourished.  HENT:  Head: Normocephalic and atraumatic.  Neck: Neck supple.  Cardiovascular: Normal rate and regular rhythm.  Pulmonary/Chest: Effort normal and breath sounds normal. He has no wheezes.  Abdominal: Soft. Bowel sounds are normal. There is no hepatosplenomegaly. There is no tenderness.  Musculoskeletal: He exhibits no edema.  Lymphadenopathy:    He has no cervical adenopathy.  Neurological: He is alert and oriented to person, place, and time.  Skin: Skin is warm  and dry.  Psychiatric: He has a normal mood and affect. His behavior is normal.  Vitals reviewed.   Results for orders placed or performed during the hospital encounter of 06/06/17  Lipid panel  Result Value Ref Range   Cholesterol 182 0 - 200 mg/dL   Triglycerides 77 <518 mg/dL   HDL 50 >34 mg/dL   Total CHOL/HDL Ratio 3.6 RATIO   VLDL 15 0 - 40  mg/dL   LDL Cholesterol 373 (H) 0 - 99 mg/dL  Comprehensive metabolic panel  Result Value Ref Range   Sodium 138 135 - 145 mmol/L   Potassium 3.9 3.5 - 5.1 mmol/L   Chloride 102 101 - 111 mmol/L   CO2 28 22 - 32 mmol/L   Glucose, Bld 99 65 - 99 mg/dL   BUN 19 6 - 20 mg/dL   Creatinine, Ser 5.78 0.61 - 1.24 mg/dL   Calcium 9.1 8.9 - 97.8 mg/dL   Total Protein 7.7 6.5 - 8.1 g/dL   Albumin 4.2 3.5 - 5.0 g/dL   AST 19 15 - 41 U/L   ALT 18 17 - 63 U/L   Alkaline Phosphatase 80 38 - 126 U/L   Total Bilirubin 0.5 0.3 - 1.2 mg/dL   GFR calc non Af Amer >60 >60 mL/min   GFR calc Af Amer >60 >60 mL/min   Anion gap 8 5 - 15      Assessment & Plan:   Encounter Diagnoses  Name Primary?  . Coronary artery disease involving native coronary artery of native heart without angina pectoris Yes  . Abnormal CT of the chest   . Pulmonary nodules   . Pulmonary emphysema, unspecified emphysema type (HCC)   . Aortic atherosclerosis (HCC)   . Coronary artery calcification   . Cigarette nicotine dependence with nicotine-induced disorder   . Mood disorder (HCC)   . Dental decay   . 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 Gastroesophageal reflux disease, esophagitis presence not specified     -reviewed labs with pt -pt put on Dental list -pt to start on simvastatin -pt to start Baby Asa daily -urged pt to get [facility] discount application turned in  -Reviewed CT chest including recommendations for repeat in 3-6 months and another repeat in 18-24 months -counseled smoking cessation -encouraged pt to go to [facility] for mood disorder -discussed repeating echo with pt.  Will discuss at next office visit after pt has submitted his [facility] discount application.  Pt in agreement with this plan -rx omeprazole for GERD and counseled on lifestyle changes to help. Gave reading information -will try to get pt set up with [facility] for medication assistance -pt to follow up in 1 month. RTO sooner  prn

## 2017-07-22 NOTE — Patient Instructions (Addendum)
1- go to [facility] 2- turn in your [facility] discount application 3-will repeat CT scan in 3-6 months (nodules, emphysema, calcifications) 4- stop smoking 5- low fat diet     Fat and Cholesterol Restricted Diet High levels of fat and cholesterol in your blood may lead to various health problems, such as diseases of the heart, blood vessels, gallbladder, liver, and pancreas. Fats are concentrated sources of energy that come in various forms. Certain types of fat, including saturated fat, may be harmful in excess. Cholesterol is a substance needed by your body in small amounts. Your body makes all the cholesterol it needs. Excess cholesterol comes from the food you eat. When you have high levels of cholesterol and saturated fat in your blood, health problems can develop because the excess fat and cholesterol will gather along the walls of your blood vessels, causing them to narrow. Choosing the right foods will help you control your intake of fat and cholesterol. This will help keep the levels of these substances in your blood within normal limits and reduce your risk of disease. What is my plan? Your health care provider recommends that you:  Limit your fat intake to ______% or less of your total calories per day.  Limit the amount of cholesterol in your diet to less than _________mg per day.  Eat 20-30 grams of fiber each day.  What types of fat should I choose?  Choose healthy fats more often. Choose monounsaturated and polyunsaturated fats, such as olive and canola oil, flaxseeds, walnuts, almonds, and seeds.  Eat more omega-3 fats. Good choices include salmon, mackerel, sardines, tuna, flaxseed oil, and ground flaxseeds. Aim to eat fish at least two times a week.  Limit saturated fats. Saturated fats are primarily found in animal products, such as meats, butter, and cream. Plant sources of saturated fats include palm oil, palm kernel oil, and coconut oil.  Avoid foods with partially  hydrogenated oils in them. These contain trans fats. Examples of foods that contain trans fats are stick margarine, some tub margarines, cookies, crackers, and other baked goods. What general guidelines do I need to follow? These guidelines for healthy eating will help you control your intake of fat and cholesterol:  Check food labels carefully to identify foods with trans fats or high amounts of saturated fat.  Fill one half of your plate with vegetables and green salads.  Fill one fourth of your plate with whole grains. Look for the word "whole" as the first word in the ingredient list.  Fill one fourth of your plate with lean protein foods.  Limit fruit to two servings a day. Choose fruit instead of juice.  Eat more foods that contain fiber, such as apples, broccoli, carrots, beans, peas, and barley.  Eat more home-cooked food and less restaurant, buffet, and fast food.  Limit or avoid alcohol.  Limit foods high in starch and sugar.  Limit fried foods.  Cook foods using methods other than frying. Baking, boiling, grilling, and broiling are all great options.  Lose weight if you are overweight. Losing just 5-10% of your initial body weight can help your overall health and prevent diseases such as diabetes and heart disease.  What foods can I eat? Grains  Whole grains, such as whole wheat or whole grain breads, crackers, cereals, and pasta. Unsweetened oatmeal, bulgur, barley, quinoa, or brown rice. Corn or whole wheat flour tortillas. Vegetables  Fresh or frozen vegetables (raw, steamed, roasted, or grilled). Green salads. Fruits  All fresh,  canned (in natural juice), or frozen fruits. Meats and other protein foods  Ground beef (85% or leaner), grass-fed beef, or beef trimmed of fat. Skinless chicken or [place]. Ground chicken or [place]. Pork trimmed of fat. All fish and seafood. Eggs. Dried beans, peas, or lentils. Unsalted nuts or seeds. Unsalted canned or dry  beans. Dairy  Low-fat dairy products, such as skim or 1% milk, 2% or reduced-fat cheeses, low-fat ricotta or cottage cheese, or plain low-fat yo Fats and oils  Tub margarines without trans fats. Light or reduced-fat mayonnaise and salad dressings. Avocado. Olive, canola, sesame, or safflower oils. Natural peanut or almond butter (choose ones without added sugar and oil). The items listed above may not be a complete list of recommended foods or beverages. Contact your dietitian for more options. Foods to avoid Grains  White bread. White pasta. White rice. Cornbread. Bagels, pastries, and croissants. Crackers that contain trans fat. Vegetables  White potatoes. Corn. Creamed or fried vegetables. Vegetables in a cheese sauce. Fruits  Dried fruits. Canned fruit in light or heavy syrup. Fruit juice. Meats and other protein foods  Fatty cuts of meat. Ribs, chicken wings, bacon, sausage, bologna, salami, chitterlings, fatback, hot dogs, bratwurst, and packaged luncheon meats. Liver and organ meats. Dairy  Whole or 2% milk, cream, half-and-half, and cream cheese. Whole milk cheeses. Whole-fat or sweetened yogurt. Full-fat cheeses. Nondairy creamers and whipped toppings. Processed cheese, cheese spreads, or cheese curds. Beverages  Alcohol. Sweetened drinks (such as sodas, lemonade, and fruit drinks or punches). Fats and oils  Butter, stick margarine, lard, shortening, ghee, or bacon fat. Coconut, palm kernel, or palm oils. Sweets and desserts  Corn syrup, sugars, honey, and molasses. Candy. Jam and jelly. Syrup. Sweetened cereals. Cookies, pies, cakes, donuts, muffins, and ice cream. The items listed above may not be a complete list of foods and beverages to avoid. Contact your dietitian for more information. This information is not intended to replace advice given to you by your health care provider. Make sure you discuss any questions you have with your health care provider. Document  Released: 07/22/2005 Document Revised: 08/12/2014 Document Reviewed: 10/20/2013 Elsevier Interactive Patient Education  2017 Else[employer] Inc.    Heartburn Heartburn is a type of pain or discomfort that can happen in the throat or chest. It is often described as a burning pain. It may also cause a bad taste in the mouth. Heartburn may feel worse when you lie down or bend over, and it is often worse at night. Heartburn may be caused by stomach contents that move back up into the esophagus (reflux). Follow these instructions at home: Take these actions to decrease your discomfort and to help avoid complications. Diet  Follow a diet as recommended by your health care provider. This may involve avoiding foods and drinks such as: ? Coffee and tea (with or without caffeine). ? Drinks that contain alcohol. ? Energy drinks and sports drinks. ? Carbonated drinks or sodas. ? Chocolate and cocoa. ? Peppermint and mint flavorings. ? Garlic and onions. ? Horseradish. ? Spicy and acidic foods, including peppers, chili powder, curry powder, vinegar, hot sauces, and barbecue sauce. ? Citrus fruit juices and citrus fruits, such as oranges, lemons, and limes. ? Tomato-based foods, such as red sauce, chili, salsa, and pizza with red sauce. ? Fried and fatty foods, such as donuts, french fries, potato chips, and high-fat dressings. ? High-fat meats, such as hot dogs and fatty cuts of red and white meats, such as rib eye  steak, sausage, ham, and bacon. ? High-fat dairy items, such as whole milk, butter, and cream cheese.  Eat small, frequent meals instead of large meals.  Avoid drinking large amounts of liquid with your meals.  Avoid eating meals during the 2-3 hours before bedtime.  Avoid lying down right after you eat.  Do not exercise right after you eat. General instructions  Pay attention to any changes in your symptoms.  Take over-the-counter and prescription medicines only as told by your  health care provider. Do not take aspirin, ibuprofen, or other NSAIDs unless your health care provider told you to do so.  Do not use any tobacco products, including cigarettes, chewing tobacco, and e-cigarettes. If you need help quitting, ask your health care provider.  Wear loose-fitting clothing. Do not wear anything tight around your waist that causes pressure on your abdomen.  Raise (elevate) the head of your bed about 6 inches (15 cm).  Try to reduce your stress, such as with yoga or meditation. If you need help reducing stress, ask your health care provider.  If you are overweight, reduce your weight to an amount that is healthy for you. Ask your health care provider for guidance about a safe weight loss goal.  Keep all follow-up visits as told by your health care provider. This is important. Contact a health care provider if:  You have new symptoms.  You have unexplained weight loss.  You have difficulty swallowing, or it hurts to swallow.  You have wheezing or a persistent cough.  Your symptoms do not improve with treatment.  You have frequent heartburn for more than two weeks. Get help right away if:  You have pain in your arms, neck, jaw, teeth, or back.  You feel sweaty, dizzy, or light-headed.  You have chest pain or shortness of breath.  You vomit and your vomit looks like blood or coffee grounds.  Your stool is bloody or black. This information is not intended to replace advice given to you by your health care provider. Make sure you discuss any questions you have with your health care provider. Document Released: 12/08/2008 Document Revised: 12/28/2015 Document Reviewed: 11/16/2014 Elsevier Interactive Patient Education  2017 Else[employer] Inc.    Coping with Quitting Smoking Quitting smoking is a physical and mental challenge. You will face cravings, withdrawal symptoms, and temptation. Before quitting, work with your health care provider to make a plan that  can help you cope. Preparation can help you quit and keep you from giving in. How can I cope with cravings? Cravings usually last for 5-10 minutes. If you get through it, the craving will pass. Consider taking the following actions to help you cope with cravings:  Keep your mouth busy: ? Chew sugar-free gum. ? Suck on hard candies or a straw. ? Brush your teeth.  Keep your hands and body busy: ? Immediately change to a different activity when you feel a craving. ? Squeeze or play with a ball. ? Do an activity or a hobby, like making bead jewelry, practicing needlepoint, or working with wood. ? Mix up your normal routine. ? Take a short exercise break. Go for a quick walk or run up and down stairs. ? Spend time in public places where smoking is not allowed.  Focus on doing something kind or helpful for someone else.  Call a friend or family member to talk during a craving.  Join a support group.  Call a quit line, such as 1-800-QUIT-NOW.  Talk with your  health care provider about medicines that might help you cope with cravings and make quitting easier for you.  How can I deal with withdrawal symptoms? Your body may experience negative effects as it tries to get used to not having nicotine in the system. These effects are called withdrawal symptoms. They may include:  Feeling hungrier than normal.  Trouble concentrating.  Irritability.  Trouble sleeping.  Feeling depressed.  Restlessness and agitation.  Craving a cigarette.  To manage withdrawal symptoms:  Avoid places, people, and activities that trigger your cravings.  Remember why you want to quit.  Get plenty of sleep.  Avoid coffee and other caffeinated drinks. These may worsen some of your symptoms.  How can I handle social situations? Social situations can be difficult when you are quitting smoking, especially in the first few weeks. To manage this, you can:  Avoid parties, bars, and other social  situations where people might be smoking.  Avoid alcohol.  Leave right away if you have the urge to smoke.  Explain to your family and friends that you are quitting smoking. Ask for understanding and support.  Plan activities with friends or family where smoking is not an option.  What are some ways I can cope with stress? Wanting to smoke may cause stress, and stress can make you want to smoke. Find ways to manage your stress. Relaxation techniques can help. For example:  Breathe slowly and deeply, in through your nose and out through your mouth.  Listen to soothing, relaxing music.  Talk with a family member or friend about your stress.  Light a candle.  Soak in a bath or take a shower.  Think about a peaceful place.  What are some ways I can prevent weight gain? Be aware that many people gain weight after they quit smoking. However, not everyone does. To keep from gaining weight, have a plan in place before you quit and stick to the plan after you quit. Your plan should include:  Having healthy snacks. When you have a craving, it may help to: ? Eat plain popcorn, crunchy carrots, celery, or other cut vegetables. ? Chew sugar-free gum.  Changing how you eat: ? Eat small portion sizes at meals. ? Eat 4-6 small meals throughout the day instead of 1-2 large meals a day. ? Be mindful when you eat. Do not watch television or do other things that might distract you as you eat.  Exercising regularly: ? Make time to exercise each day. If you do not have time for a long workout, do short bouts of exercise for 5-10 minutes several times a day. ? Do some form of strengthening exercise, like weight lifting, and some form of aerobic exercise, like running or swimming.  Drinking plenty of water or other low-calorie or no-calorie drinks. Drink 6-8 glasses of water daily, or as much as instructed by your health care provider.  Summary  Quitting smoking is a physical and mental  challenge. You will face cravings, withdrawal symptoms, and temptation to smoke again. Preparation can help you as you go through these challenges.  You can cope with cravings by keeping your mouth busy (such as by chewing gum), keeping your body and hands busy, and making calls to family, friends, or a helpline for people who want to quit smoking.  You can cope with withdrawal symptoms by avoiding places where people smoke, avoiding drinks with caffeine, and getting plenty of rest.  Ask your health care provider about the different ways to  prevent weight gain, avoid stress, and handle social situations. This information is not intended to replace advice given to you by your health care provider. Make sure you discuss any questions you have with your health care provider. Document Released: 07/19/2016 Document Revised: 07/19/2016 Document Reviewed: 07/19/2016 Elsevier Interactive Patient Education  [employer].

## 2017-08-20 ENCOUNTER — Ambulatory Visit: Payer: Self-pay | Admitting: Physician Assistant

## 2017-08-21 ENCOUNTER — Ambulatory Visit: Payer: Self-pay | Admitting: Physician Assistant

## 2017-08-25 ENCOUNTER — Encounter: Payer: Self-pay | Admitting: Physician Assistant

## 2017-08-26 ENCOUNTER — Telehealth: Payer: Self-pay

## 2017-08-26 ENCOUNTER — Ambulatory Visit: Payer: Self-pay | Admitting: Physician Assistant

## 2017-08-26 NOTE — Telephone Encounter (Signed)
Attempted to call client with [facility] scheduling. Client is not available. Will attempt to call again at a later date.  No message left.

## 2017-08-27 ENCOUNTER — Telehealth: Payer: Self-pay

## 2017-08-27 NOTE — Telephone Encounter (Signed)
RN called to advise client of Pickup time arranged with [facility] for an appointment [facility] on 09/01/17 at 1345. Pickup time will be 1300. Client states understanding.   Client discussed if [employer] had funds to pay for TB testing. The [employer] [facility] does not have funds to help with paying for TB testing for employment.Client states he will get the money another way and states understanding.  Will follow as needed

## 2017-09-01 ENCOUNTER — Ambulatory Visit: Payer: Self-pay | Admitting: Physician Assistant

## 2017-09-01 ENCOUNTER — Encounter: Payer: Self-pay | Admitting: Physician Assistant

## 2017-09-01 VITALS — BP 100/70 | HR 77 | Temp 98.1°F | Wt 169.5 lb

## 2017-09-01 DIAGNOSIS — F39 Unspecified mood [affective] disorder: Secondary | ICD-10-CM

## 2017-09-01 DIAGNOSIS — F17219 Nicotine dependence, cigarettes, with unspecified nicotine-induced disorders: Secondary | ICD-10-CM

## 2017-09-01 DIAGNOSIS — R918 Other nonspecific abnormal finding of lung field: Secondary | ICD-10-CM

## 2017-09-01 DIAGNOSIS — R9389 Abnormal findings on diagnostic imaging of other specified body structures: Secondary | ICD-10-CM

## 2017-09-01 DIAGNOSIS — I2510 Atherosclerotic heart disease of native coronary artery without angina pectoris: Secondary | ICD-10-CM

## 2017-09-01 DIAGNOSIS — Z9119 Patient's noncompliance with other medical treatment and regimen: Secondary | ICD-10-CM

## 2017-09-01 DIAGNOSIS — R455 Hostility: Secondary | ICD-10-CM

## 2017-09-01 DIAGNOSIS — Z91199 Patient's noncompliance with other medical treatment and regimen due to unspecified reason: Secondary | ICD-10-CM

## 2017-09-01 NOTE — Progress Notes (Signed)
BP 100/70 (BP Location: Left Arm, Patient Position: Sitting, Cuff Size: Normal)   Pulse 77   Temp 98.1 F (36.7 C)   Wt 169 lb 8 oz (76.9 kg)   SpO2 96%   BMI 26.55 kg/m    Subjective:    Patient ID: Brian Ford, male    DOB: February 04, 1966, 52 y.o.   MRN: [id number]  HPI: Brian Ford is a 52 y.o. male presenting on 09/01/2017 for Follow-up   HPI   Pt says he did not turn in his [facility] application because he needs a tax form.   He says he can't get the tax form due to the government shutdown.  Pt wants to know percentage of blockage of heart.  He now complains of pain in shoulder and back and wants to know the size of the nodules in the lungs.   He thought the lung nodules were causing his shoulder and back pain.  I reviewed the size of his lung nodules with him.    Pt is not taking his simvastatin.  He says he doesn't have the money.   He is still smoking cigarettes 1 ppd.   Relevant past medical, surgical, family and social history reviewed and updated as indicated. Interim medical history since our last visit reviewed. Allergies and medications reviewed and updated.  Review of Systems  Constitutional: Positive for fatigue. Negative for appetite change, chills, diaphoresis, fever and unexpected weight change.  HENT: Positive for dental problem. Negative for congestion, drooling, ear pain, facial swelling, hearing loss, mouth sores, sneezing, sore throat, trouble swallowing and voice change.   Eyes: Negative for pain, discharge, redness, itching and visual disturbance.  Respiratory: Positive for chest tightness, shortness of breath and wheezing. Negative for cough and choking.   Cardiovascular: Negative for chest pain, palpitations and leg swelling.  Gastrointestinal: Positive for abdominal pain and constipation. Negative for blood in stool, diarrhea and vomiting.  Endocrine: Negative for cold intolerance, heat intolerance and polydipsia.  Genitourinary: Negative  for decreased urine volume, dysuria and hematuria.  Musculoskeletal: Positive for back pain. Negative for arthralgias and gait problem.  Skin: Negative for rash.  Allergic/Immunologic: Negative for environmental allergies.  Neurological: Positive for light-headedness and headaches. Negative for seizures and syncope.  Hematological: Negative for adenopathy.  Psychiatric/Behavioral: Positive for agitation and dysphoric mood. Negative for suicidal ideas. The patient is nervous/anxious.     Per HPI unless specifically indicated above     Objective:    BP 100/70 (BP Location: Left Arm, Patient Position: Sitting, Cuff Size: Normal)   Pulse 77   Temp 98.1 F (36.7 C)   Wt 169 lb 8 oz (76.9 kg)   SpO2 96%   BMI 26.55 kg/m   Wt Readings from Last 3 Encounters:  09/01/17 169 lb 8 oz (76.9 kg)  07/22/17 165 lb (74.8 kg)  06/03/17 159 lb 8 oz (72.3 kg)    Physical Exam  Constitutional: He is oriented to person, place, and time. He appears well-developed and well-nourished.  HENT:  Head: Normocephalic and atraumatic.  Pulmonary/Chest: Effort normal.  Neurological: He is alert and oriented to person, place, and time.  Psychiatric: His affect is angry and inappropriate. He is agitated and aggressive. Thought content is paranoid. He expresses impulsivity.  Nursing note and vitals reviewed.        Assessment & Plan:   Encounter Diagnoses  Name Primary?  . Mood disorder (HCC) Yes  . Hostility   . Pulmonary nodules   .  Abnormal CT of the chest   . Coronary artery disease involving native coronary artery of native heart without angina pectoris   . Cigarette nicotine dependence with nicotine-induced disorder   . Noncompliance with treatment       Upon asking pt about his smoking, he became hostile and started yellling profanities and he said he was leaving and wasn't coming back.  Pt left office telling me to "kiss his ass".   He will no longer be seen here as a patient.

## 2018-01-29 IMAGING — CT CT CHEST W/O CM
2 of 3 series · 15 of 36 positions shown, 18 images · non-contrast
Comparison: Chest radiograph 05/27/2017

CLINICAL DATA: Evaluate for lung nodule. Question a nodule on
recent chest radiograph. History of tobacco use.

EXAM:
CT CHEST WITHOUT CONTRAST
TECHNIQUE: Multidetector CT imaging of the chest was performed following the
standard protocol without IV contrast.

[Series 2: thorax · axial · 0.71mm/px · z∈[-322,-38]mm · 12 of 168 slices shown, 15 images]
[im 13/168  mediastinal]
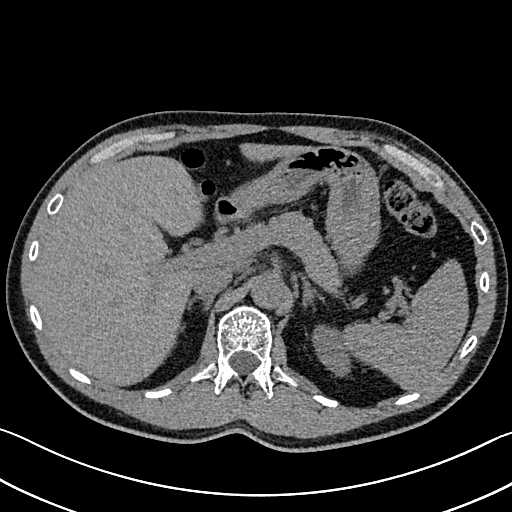
[im 13/168  lung]
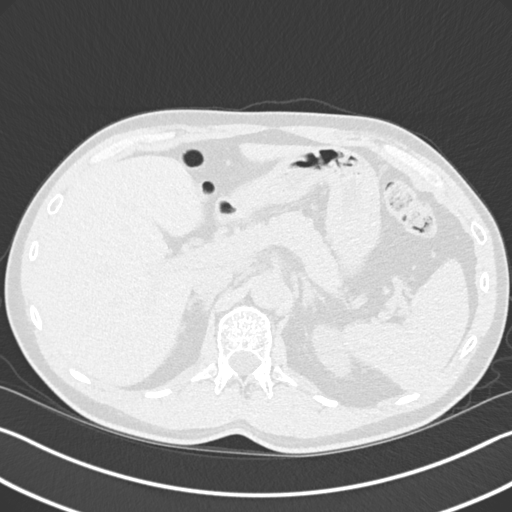
[im 25/168  lung]
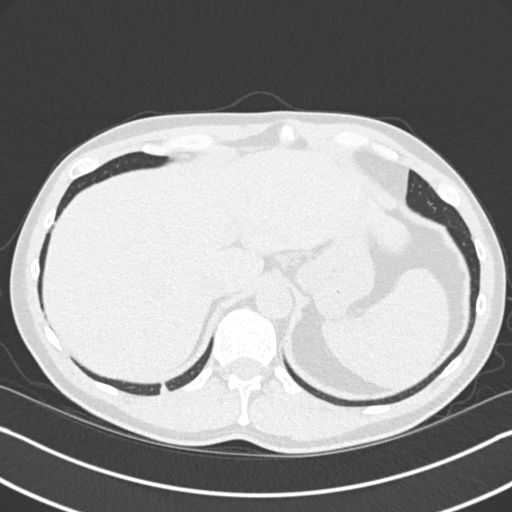
[im 38/168  lung]
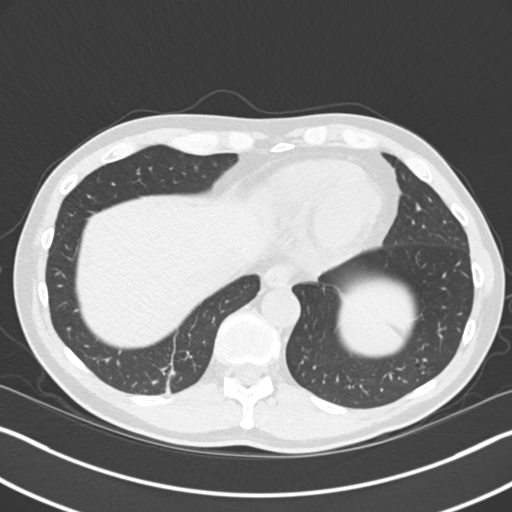
[im 50/168  lung]
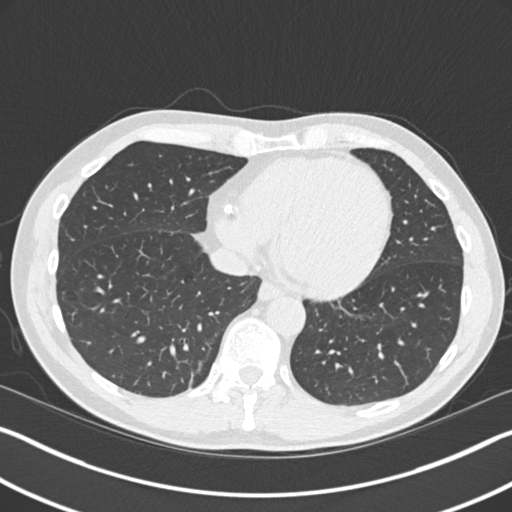
[im 62/168  mediastinal]
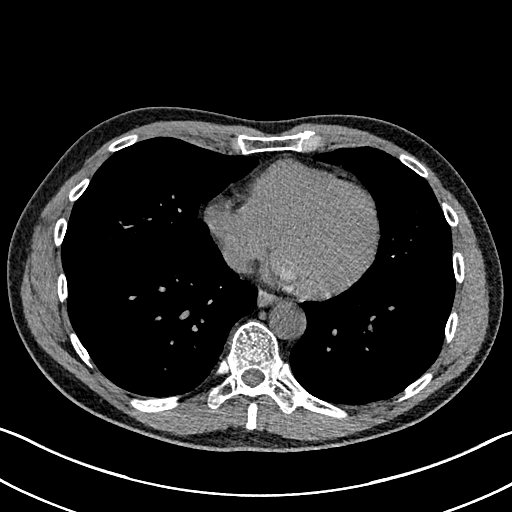
[im 62/168  lung]
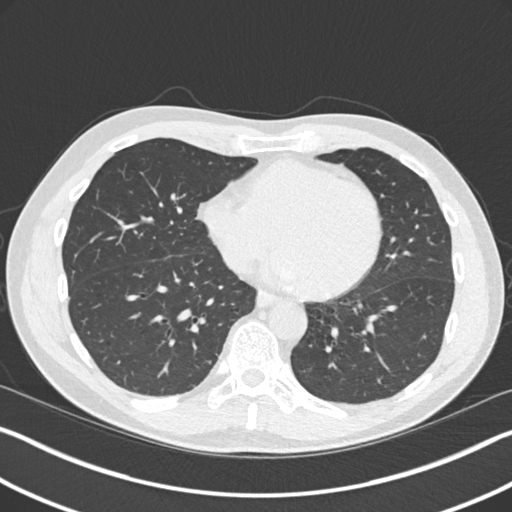
[im 75/168  lung]
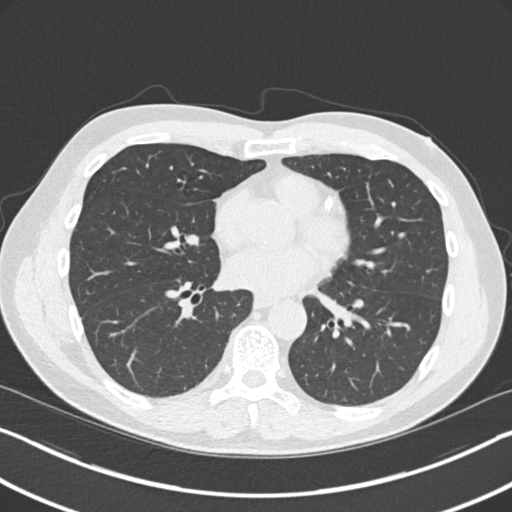
[im 93/168  lung]
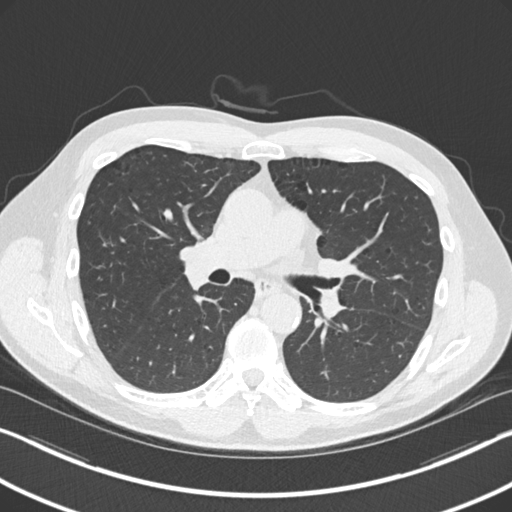
[im 106/168  lung]
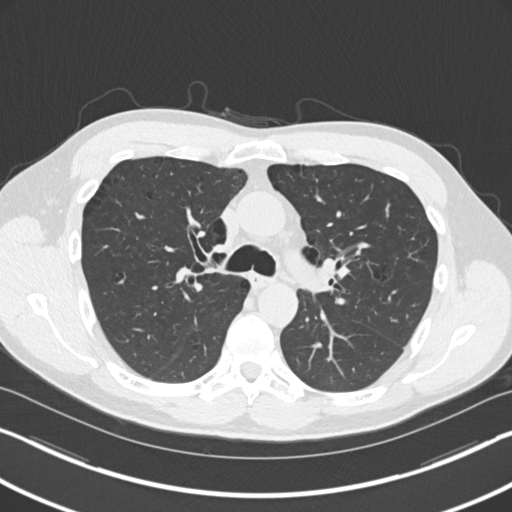
[im 118/168  mediastinal]
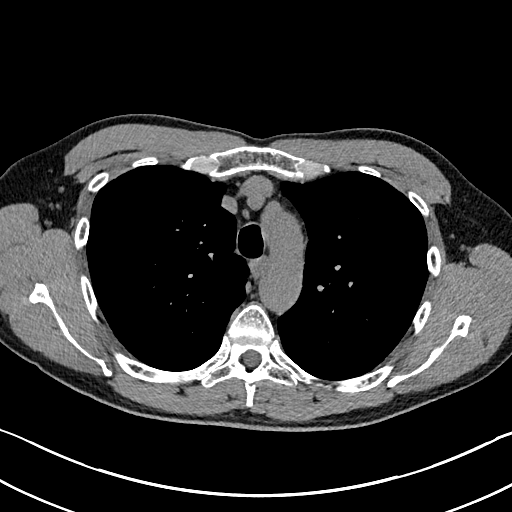
[im 118/168  lung]
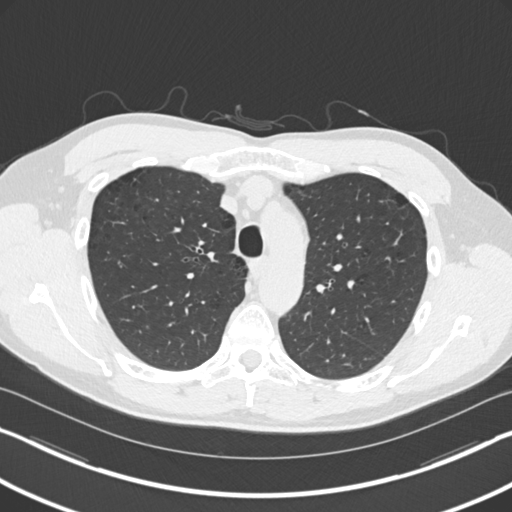
[im 130/168  lung]
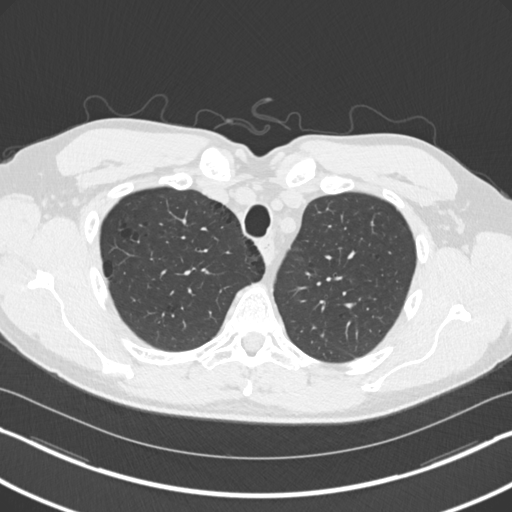
[im 143/168  lung]
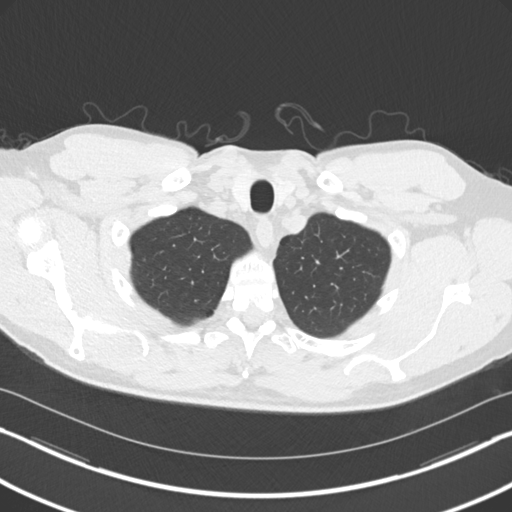
[im 155/168  lung]
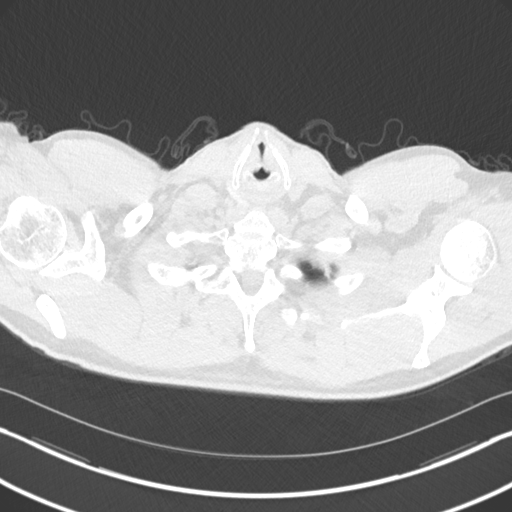

[Series 5: coronal · coronal · 0.69mm/px · 3 of 122 slices shown]
[im 25/122  lung]
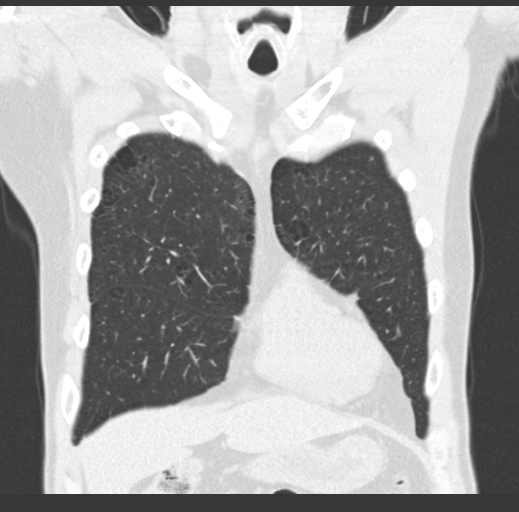
[im 49/122  lung]
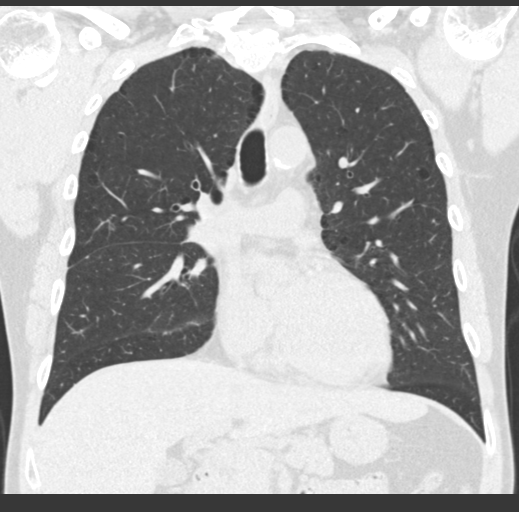
[im 73/122  lung]
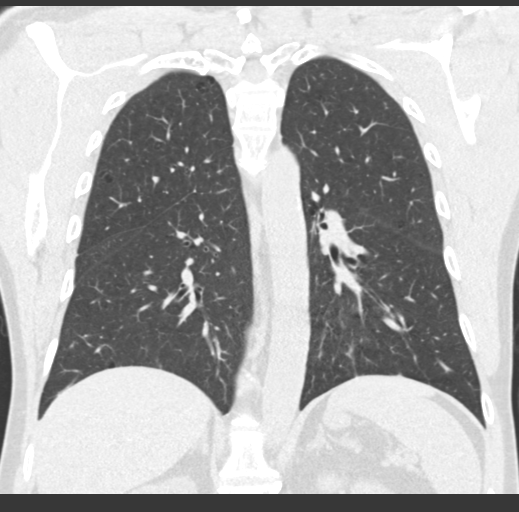

[15 of 36 positions shown; findings below may reference images not displayed]

FINDINGS: Cardiovascular: Normal caliber of the thoracic aorta. Calcifications
at the aortic arch and involving the great vessels. Coronary artery
calcifications and evidence for stent in the LAD. Heart size is
normal. No significant pericardial fluid.

Mediastinum/Nodes: Mild heterogeneity in the thyroid tissue. Small
mediastinal lymph nodes. Largest lymph node is at the AP window
measuring 1.1 cm on sequence 2, image 16. No significant hilar
lymphadenopathy but limited evaluation on this noncontrast
examination. Esophagus is unremarkable. No axillary lymphadenopathy.

Lungs/Pleura: Trachea and mainstem bronchi are patent. Mild
paraseptal and centrilobular emphysema. Mild scarring at the lung
apices. No suspicious nodule or mass at the right lung apex. 4 mm
nodule in the right upper lobe on sequence 4, image 83. Linear band
in the posterior right lower lobe is suggestive for scarring or
atelectasis. 3 mm nodule in the right lower lobe on sequence 4,
image 120. Punctate nodule in the anterior left upper lobe on
sequence 4, image 54. Pleural-based nodule in the medial left upper
lobe measuring 6 mm on sequence 4, image 78. Several additional
small pulmonary nodules in left lung that are less than 5 mm in
size. No pleural effusions.

Upper Abdomen: Images of the upper abdomen are unremarkable.

Musculoskeletal: Mild depression along the superior endplate of T[DATE] be related to a Schmorl's node. No suspicious bone findings.
IMPRESSION: Several small pulmonary nodules, largest measuring 6 mm in left
upper lobe. Non-contrast chest CT at 3-6 months is recommended. If
the nodules are stable at time of repeat CT, then future CT at 18-24
months (from today's scan) is considered optional for low-risk
patients, but is recommended for high-risk patients. This
recommendation follows the consensus statement: Guidelines for
Management of Incidental Pulmonary Nodules Detected on CT Images:

Aortic Atherosclerosis (LGP55-4F3.3) and Emphysema (LGP55-G5Q.6).

Coronary artery calcifications.

## 2018-03-03 ENCOUNTER — Other Ambulatory Visit: Payer: Self-pay

## 2018-03-03 ENCOUNTER — Inpatient Hospital Stay (HOSPITAL_COMMUNITY)
Admission: EM | Admit: 2018-03-03 | Discharge: 2018-03-04 | DRG: 247 | Discharge status: Against Medical Advice | Payer: Self-pay | Attending: Cardiovascular Disease | Admitting: Cardiovascular Disease

## 2018-03-03 ENCOUNTER — Encounter (HOSPITAL_COMMUNITY)
Admission: EM | Discharge status: Against Medical Advice | Payer: Self-pay | Source: Home / Self Care | Attending: Cardiovascular Disease

## 2018-03-03 ENCOUNTER — Encounter (HOSPITAL_COMMUNITY): Payer: Self-pay | Admitting: *Deleted

## 2018-03-03 DIAGNOSIS — R918 Other nonspecific abnormal finding of lung field: Secondary | ICD-10-CM | POA: Diagnosis present

## 2018-03-03 DIAGNOSIS — I252 Old myocardial infarction: Secondary | ICD-10-CM

## 2018-03-03 DIAGNOSIS — Z955 Presence of coronary angioplasty implant and graft: Secondary | ICD-10-CM

## 2018-03-03 DIAGNOSIS — Z823 Family history of stroke: Secondary | ICD-10-CM

## 2018-03-03 DIAGNOSIS — F1420 Cocaine dependence, uncomplicated: Secondary | ICD-10-CM | POA: Diagnosis present

## 2018-03-03 DIAGNOSIS — Z9861 Coronary angioplasty status: Secondary | ICD-10-CM

## 2018-03-03 DIAGNOSIS — Z720 Tobacco use: Secondary | ICD-10-CM | POA: Diagnosis present

## 2018-03-03 DIAGNOSIS — Z9119 Patient's noncompliance with other medical treatment and regimen: Secondary | ICD-10-CM

## 2018-03-03 DIAGNOSIS — I2582 Chronic total occlusion of coronary artery: Secondary | ICD-10-CM | POA: Diagnosis present

## 2018-03-03 DIAGNOSIS — E785 Hyperlipidemia, unspecified: Secondary | ICD-10-CM | POA: Diagnosis present

## 2018-03-03 DIAGNOSIS — I4510 Unspecified right bundle-branch block: Secondary | ICD-10-CM | POA: Diagnosis present

## 2018-03-03 DIAGNOSIS — F329 Major depressive disorder, single episode, unspecified: Secondary | ICD-10-CM | POA: Diagnosis present

## 2018-03-03 DIAGNOSIS — Z7982 Long term (current) use of aspirin: Secondary | ICD-10-CM

## 2018-03-03 DIAGNOSIS — I2510 Atherosclerotic heart disease of native coronary artery without angina pectoris: Secondary | ICD-10-CM | POA: Diagnosis present

## 2018-03-03 DIAGNOSIS — F1410 Cocaine abuse, uncomplicated: Secondary | ICD-10-CM | POA: Diagnosis present

## 2018-03-03 DIAGNOSIS — F17210 Nicotine dependence, cigarettes, uncomplicated: Secondary | ICD-10-CM | POA: Diagnosis present

## 2018-03-03 DIAGNOSIS — K219 Gastro-esophageal reflux disease without esophagitis: Secondary | ICD-10-CM | POA: Diagnosis present

## 2018-03-03 DIAGNOSIS — I10 Essential (primary) hypertension: Secondary | ICD-10-CM | POA: Diagnosis present

## 2018-03-03 DIAGNOSIS — Z79899 Other long term (current) drug therapy: Secondary | ICD-10-CM

## 2018-03-03 DIAGNOSIS — F1910 Other psychoactive substance abuse, uncomplicated: Secondary | ICD-10-CM | POA: Diagnosis present

## 2018-03-03 DIAGNOSIS — I2102 ST elevation (STEMI) myocardial infarction involving left anterior descending coronary artery: Principal | ICD-10-CM

## 2018-03-03 DIAGNOSIS — Z560 Unemployment, unspecified: Secondary | ICD-10-CM

## 2018-03-03 DIAGNOSIS — F419 Anxiety disorder, unspecified: Secondary | ICD-10-CM | POA: Diagnosis present

## 2018-03-03 DIAGNOSIS — Z8249 Family history of ischemic heart disease and other diseases of the circulatory system: Secondary | ICD-10-CM

## 2018-03-03 HISTORY — PX: LEFT HEART CATH AND CORONARY ANGIOGRAPHY: CATH118249

## 2018-03-03 HISTORY — PX: CORONARY/GRAFT ACUTE MI REVASCULARIZATION: CATH118305

## 2018-03-03 LAB — COMPREHENSIVE METABOLIC PANEL
ALT: 23 U/L (ref 0–44)
ANION GAP: 13 (ref 5–15)
AST: 26 U/L (ref 15–41)
Albumin: 3.9 g/dL (ref 3.5–5.0)
Alkaline Phosphatase: 71 U/L (ref 38–126)
BILIRUBIN TOTAL: 0.6 mg/dL (ref 0.3–1.2)
BUN: 16 mg/dL (ref 6–20)
CO2: 20 mmol/L — ABNORMAL LOW (ref 22–32)
Calcium: 9.1 mg/dL (ref 8.9–10.3)
Chloride: 108 mmol/L (ref 98–111)
Creatinine, Ser: 1.45 mg/dL — ABNORMAL HIGH (ref 0.61–1.24)
GFR, EST NON AFRICAN AMERICAN: 54 mL/min — AB (ref >60)
Glucose, Bld: 123 mg/dL — ABNORMAL HIGH (ref 70–99)
POTASSIUM: 4 mmol/L (ref 3.5–5.1)
Sodium: 141 mmol/L (ref 135–145)
TOTAL PROTEIN: 6.9 g/dL (ref 6.5–8.1)

## 2018-03-03 LAB — LIPID PANEL
CHOL/HDL RATIO: 5.5 ratio
CHOLESTEROL: 216 mg/dL — AB (ref 0–200)
HDL: 39 mg/dL — ABNORMAL LOW (ref >40)
LDL Cholesterol: 147 mg/dL — ABNORMAL HIGH (ref 0–99)
Triglycerides: 150 mg/dL — ABNORMAL HIGH (ref <150)
VLDL: 30 mg/dL (ref 0–40)

## 2018-03-03 LAB — POCT I-STAT, CHEM 8
BUN: 24 mg/dL — ABNORMAL HIGH (ref 6–20)
CALCIUM ION: 1.01 mmol/L — AB (ref 1.15–1.40)
CHLORIDE: 108 mmol/L (ref 98–111)
CREATININE: 1.4 mg/dL — AB (ref 0.61–1.24)
GLUCOSE: 124 mg/dL — AB (ref 70–99)
HCT: 44 % (ref 39.0–52.0)
Hemoglobin: 15 g/dL (ref 13.0–17.0)
Potassium: 5.5 mmol/L — ABNORMAL HIGH (ref 3.5–5.1)
Sodium: 138 mmol/L (ref 135–145)
TCO2: 23 mmol/L (ref 22–32)

## 2018-03-03 LAB — CBC
HCT: 45.5 % (ref 39.0–52.0)
HCT: 44.2 % (ref 39.0–52.0)
HEMOGLOBIN: 15 g/dL (ref 13.0–17.0)
Hemoglobin: 14.7 g/dL (ref 13.0–17.0)
MCH: 27.8 pg (ref 26.0–34.0)
MCH: 27.9 pg (ref 26.0–34.0)
MCHC: 33 g/dL (ref 30.0–36.0)
MCHC: 33.3 g/dL (ref 30.0–36.0)
MCV: 84.3 fL (ref 78.0–100.0)
MCV: 83.9 fL (ref 78.0–100.0)
PLATELETS: 294 10*3/uL (ref 150–400)
Platelets: 252 10*3/uL (ref 150–400)
RBC: 5.4 MIL/uL (ref 4.22–5.81)
RBC: 5.27 MIL/uL (ref 4.22–5.81)
RDW: 13.8 % (ref 11.5–15.5)
RDW: 13.7 % (ref 11.5–15.5)
WBC: 15.9 10*3/uL — ABNORMAL HIGH (ref 4.0–10.5)
WBC: 13.9 10*3/uL — ABNORMAL HIGH (ref 4.0–10.5)

## 2018-03-03 LAB — POCT I-STAT TROPONIN I: Troponin i, poc: 0.58 ng/mL (ref 0.00–0.08)

## 2018-03-03 LAB — POCT ACTIVATED CLOTTING TIME: Activated Clotting Time: 323 seconds

## 2018-03-03 LAB — HEMOGLOBIN A1C
Hgb A1c MFr Bld: 5.7 % — ABNORMAL HIGH (ref 4.8–5.6)
Mean Plasma Glucose: 116.89 mg/dL

## 2018-03-03 LAB — PROTIME-INR
INR: 0.95
PROTHROMBIN TIME: 12.6 s (ref 11.4–15.2)

## 2018-03-03 LAB — CREATININE, SERUM
CREATININE: 1.21 mg/dL (ref 0.61–1.24)
GFR calc Af Amer: >60 mL/min (ref >60)

## 2018-03-03 LAB — MRSA PCR SCREENING: MRSA by PCR: NEGATIVE

## 2018-03-03 LAB — APTT: aPTT: 39 seconds — ABNORMAL HIGH (ref 24–36)

## 2018-03-03 LAB — TROPONIN I
Troponin I: 0.44 ng/mL (ref <0.03)
Troponin I: >65 ng/mL (ref <0.03)

## 2018-03-03 LAB — PLATELET COUNT: Platelets: 232 10*3/uL (ref 150–400)

## 2018-03-03 SURGERY — LEFT HEART CATH AND CORONARY ANGIOGRAPHY
Anesthesia: LOCAL

## 2018-03-03 MED ORDER — ACETAMINOPHEN 325 MG PO TABS
650.0000 mg | ORAL_TABLET | ORAL | Status: DC | PRN
  Administered 2018-03-03 – 2018-03-04 (×2): 650 mg via ORAL
  Filled 2018-03-03 (×2): qty 2

## 2018-03-03 MED ORDER — TICAGRELOR 90 MG PO TABS
90.0000 mg | ORAL_TABLET | Freq: Two times a day (BID) | ORAL | Status: DC
  Administered 2018-03-03 – 2018-03-04 (×3): 90 mg via ORAL
  Filled 2018-03-03 (×3): qty 1

## 2018-03-03 MED ORDER — HEPARIN (PORCINE) IN NACL 1000-0.9 UT/500ML-% IV SOLN
INTRAVENOUS | Status: AC
  Filled 2018-03-03: qty 500

## 2018-03-03 MED ORDER — HEPARIN SODIUM (PORCINE) 1000 UNIT/ML IJ SOLN
INTRAMUSCULAR | Status: AC
  Filled 2018-03-03: qty 1

## 2018-03-03 MED ORDER — VERAPAMIL HCL 2.5 MG/ML IV SOLN
INTRAVENOUS | Status: AC
  Filled 2018-03-03: qty 2

## 2018-03-03 MED ORDER — IOPAMIDOL (ISOVUE-370) INJECTION 76%
INTRAVENOUS | Status: AC
  Filled 2018-03-03: qty 125

## 2018-03-03 MED ORDER — TIROFIBAN (AGGRASTAT) BOLUS VIA INFUSION
INTRAVENOUS | Status: DC | PRN
  Administered 2018-03-03: 1870 ug via INTRAVENOUS

## 2018-03-03 MED ORDER — LIDOCAINE HCL (PF) 1 % IJ SOLN
INTRAMUSCULAR | Status: DC | PRN
  Administered 2018-03-03: 5 mL

## 2018-03-03 MED ORDER — MIDAZOLAM HCL 2 MG/2ML IJ SOLN
INTRAMUSCULAR | Status: AC
  Filled 2018-03-03: qty 2

## 2018-03-03 MED ORDER — TICAGRELOR 90 MG PO TABS
ORAL_TABLET | ORAL | Status: DC | PRN
  Administered 2018-03-03: 180 mg via ORAL

## 2018-03-03 MED ORDER — LORAZEPAM 2 MG/ML IJ SOLN
2.0000 mg | Freq: Once | INTRAMUSCULAR | Status: AC
  Administered 2018-03-03: 2 mg via INTRAVENOUS

## 2018-03-03 MED ORDER — NITROGLYCERIN 1 MG/10 ML FOR IR/CATH LAB
INTRA_ARTERIAL | Status: DC | PRN
  Administered 2018-03-03: 100 ug via INTRACORONARY

## 2018-03-03 MED ORDER — BIVALIRUDIN BOLUS VIA INFUSION - CUPID: INTRAVENOUS | Status: DC | PRN

## 2018-03-03 MED ORDER — IOPAMIDOL (ISOVUE-370) INJECTION 76%
INTRAVENOUS | Status: DC | PRN
  Administered 2018-03-03: 125 mL via INTRA_ARTERIAL

## 2018-03-03 MED ORDER — TICAGRELOR 90 MG PO TABS
ORAL_TABLET | ORAL | Status: AC
  Filled 2018-03-03: qty 2

## 2018-03-03 MED ORDER — HYDRALAZINE HCL 20 MG/ML IJ SOLN: 5.0000 mg | INTRAMUSCULAR | Status: AC | PRN

## 2018-03-03 MED ORDER — MIDAZOLAM HCL 2 MG/2ML IJ SOLN
INTRAMUSCULAR | Status: DC | PRN
  Administered 2018-03-03: 1 mg via INTRAVENOUS

## 2018-03-03 MED ORDER — SODIUM CHLORIDE 0.9% FLUSH: 3.0000 mL | Freq: Two times a day (BID) | INTRAVENOUS | Status: DC

## 2018-03-03 MED ORDER — FENTANYL CITRATE (PF) 100 MCG/2ML IJ SOLN
INTRAMUSCULAR | Status: AC
  Filled 2018-03-03: qty 2

## 2018-03-03 MED ORDER — TIROFIBAN HCL IV 12.5 MG/250 ML
0.0750 ug/kg/min | INTRAVENOUS | Status: AC
  Administered 2018-03-03: 0.075 ug/kg/min via INTRAVENOUS

## 2018-03-03 MED ORDER — LABETALOL HCL 5 MG/ML IV SOLN: 10.0000 mg | INTRAVENOUS | Status: AC | PRN

## 2018-03-03 MED ORDER — IOHEXOL 350 MG/ML SOLN
INTRAVENOUS | Status: DC | PRN
  Administered 2018-03-03: 10 mL via INTRA_ARTERIAL

## 2018-03-03 MED ORDER — ONDANSETRON HCL 4 MG/2ML IJ SOLN: 4.0000 mg | Freq: Four times a day (QID) | INTRAMUSCULAR | Status: DC | PRN

## 2018-03-03 MED ORDER — OXYCODONE HCL 5 MG PO TABS
5.0000 mg | ORAL_TABLET | ORAL | Status: DC | PRN
  Administered 2018-03-03 – 2018-03-04 (×2): 10 mg via ORAL
  Filled 2018-03-03 (×2): qty 2

## 2018-03-03 MED ORDER — LIDOCAINE HCL (PF) 1 % IJ SOLN
INTRAMUSCULAR | Status: AC
  Filled 2018-03-03: qty 30

## 2018-03-03 MED ORDER — TIROFIBAN HCL IV 12.5 MG/250 ML
INTRAVENOUS | Status: AC | PRN
  Administered 2018-03-03: 0.075 ug/kg/min via INTRAVENOUS

## 2018-03-03 MED ORDER — HEPARIN SODIUM (PORCINE) 5000 UNIT/ML IJ SOLN
5000.0000 [IU] | Freq: Three times a day (TID) | INTRAMUSCULAR | Status: DC
  Administered 2018-03-04 (×2): 5000 [IU] via SUBCUTANEOUS
  Filled 2018-03-03 (×2): qty 1

## 2018-03-03 MED ORDER — SODIUM CHLORIDE 0.9% FLUSH: 3.0000 mL | INTRAVENOUS | Status: DC | PRN

## 2018-03-03 MED ORDER — TIROFIBAN (AGGRASTAT) BOLUS VIA INFUSION: INTRAVENOUS | Status: DC | PRN

## 2018-03-03 MED ORDER — ASPIRIN EC 81 MG PO TBEC
81.0000 mg | DELAYED_RELEASE_TABLET | Freq: Every day | ORAL | Status: DC
  Administered 2018-03-04: 81 mg via ORAL
  Filled 2018-03-03: qty 1

## 2018-03-03 MED ORDER — HEPARIN SODIUM (PORCINE) 1000 UNIT/ML IJ SOLN
INTRAMUSCULAR | Status: DC | PRN
  Administered 2018-03-03: 4000 [IU] via INTRAVENOUS

## 2018-03-03 MED ORDER — ONDANSETRON HCL 4 MG/2ML IJ SOLN
INTRAMUSCULAR | Status: AC
  Filled 2018-03-03: qty 2

## 2018-03-03 MED ORDER — SODIUM CHLORIDE 0.9 % IV SOLN
250.0000 mL | INTRAVENOUS | Status: DC | PRN
  Administered 2018-03-03: 250 mL via INTRAVENOUS

## 2018-03-03 MED ORDER — LORAZEPAM 2 MG/ML IJ SOLN
INTRAMUSCULAR | Status: AC
  Filled 2018-03-03: qty 1

## 2018-03-03 MED ORDER — HEPARIN (PORCINE) IN NACL 2-0.9 UNITS/ML
INTRAMUSCULAR | Status: DC | PRN
  Administered 2018-03-03: 10 mL via INTRA_ARTERIAL

## 2018-03-03 MED ORDER — TIROFIBAN HCL IV 12.5 MG/250 ML
INTRAVENOUS | Status: AC
  Filled 2018-03-03: qty 250

## 2018-03-03 MED ORDER — HEPARIN (PORCINE) IN NACL 1000-0.9 UT/500ML-% IV SOLN
INTRAVENOUS | Status: DC | PRN
  Administered 2018-03-03 (×2): 500 mL

## 2018-03-03 MED ORDER — SODIUM CHLORIDE 0.9 % IV SOLN
INTRAVENOUS | Status: AC | PRN
  Administered 2018-03-03: 500 mL
  Administered 2018-03-03: 20 mL/h via INTRAVENOUS

## 2018-03-03 MED ORDER — MORPHINE SULFATE (PF) 2 MG/ML IV SOLN
2.0000 mg | INTRAVENOUS | Status: DC | PRN
  Administered 2018-03-03 (×2): 2 mg via INTRAVENOUS
  Filled 2018-03-03 (×2): qty 1

## 2018-03-03 MED ORDER — NITROGLYCERIN 0.4 MG SL SUBL
0.4000 mg | SUBLINGUAL_TABLET | SUBLINGUAL | Status: DC | PRN
  Administered 2018-03-03 (×3): 0.4 mg via SUBLINGUAL
  Filled 2018-03-03 (×3): qty 1

## 2018-03-03 MED ORDER — FENTANYL CITRATE (PF) 100 MCG/2ML IJ SOLN
INTRAMUSCULAR | Status: DC | PRN
  Administered 2018-03-03: 25 ug via INTRAVENOUS

## 2018-03-03 MED ORDER — NITROGLYCERIN 1 MG/10 ML FOR IR/CATH LAB
INTRA_ARTERIAL | Status: AC
  Filled 2018-03-03: qty 10

## 2018-03-03 MED ORDER — ATORVASTATIN CALCIUM 80 MG PO TABS
80.0000 mg | ORAL_TABLET | Freq: Every day | ORAL | Status: DC
  Administered 2018-03-03 – 2018-03-04 (×2): 80 mg via ORAL
  Filled 2018-03-03 (×2): qty 1

## 2018-03-03 MED ORDER — ONDANSETRON HCL 4 MG/2ML IJ SOLN
INTRAMUSCULAR | Status: DC | PRN
  Administered 2018-03-03: 4 mg via INTRAVENOUS

## 2018-03-03 MED ORDER — SODIUM CHLORIDE 0.9 % IV SOLN
INTRAVENOUS | Status: AC
  Administered 2018-03-03: 18:00:00 via INTRAVENOUS

## 2018-03-03 SURGICAL SUPPLY — 18 items
BALLN SAPPHIRE 2.5X12 (BALLOONS) ×2
BALLN SAPPHIRE ~~LOC~~ 3.75X12 (BALLOONS) ×2 IMPLANT
BALLOON SAPPHIRE 2.5X12 (BALLOONS) ×1 IMPLANT
CATH 5FR JL3.5 JR4 ANG PIG MP (CATHETERS) ×2 IMPLANT
CATH VISTA GUIDE 6FR XBLAD3.5 (CATHETERS) ×2 IMPLANT
DEVICE RAD COMP TR BAND LRG (VASCULAR PRODUCTS) ×2 IMPLANT
ELECT DEFIB PAD ADLT CADENCE (PAD) ×2 IMPLANT
GLIDESHEATH SLEND SS 6F .021 (SHEATH) ×4 IMPLANT
GUIDEWIRE INQWIRE 1.5J.035X260 (WIRE) ×1 IMPLANT
INQWIRE 1.5J .035X260CM (WIRE) ×2
KIT ENCORE 26 ADVANTAGE (KITS) ×2 IMPLANT
KIT HEART LEFT (KITS) ×2 IMPLANT
PACK CARDIAC CATHETERIZATION (CUSTOM PROCEDURE TRAY) ×2 IMPLANT
STENT SIERRA 3.50 X 18 MM (Permanent Stent) ×2 IMPLANT
SYR MEDRAD MARK V 150ML (SYRINGE) ×2 IMPLANT
TRANSDUCER W/STOPCOCK (MISCELLANEOUS) ×2 IMPLANT
TUBING CIL FLEX 10 FLL-RA (TUBING) ×2 IMPLANT
WIRE COUGAR XT STRL 190CM (WIRE) ×2 IMPLANT

## 2018-03-03 NOTE — Care Management Note (Signed)
Case Management Note  Patient Details  Name: Brian Ford MRN: [id number] Date of Birth: May 14, 1966  Subjective/Objective:    Pt admitted with CP post episode of substance abuse                Action/Plan:  PTA independent from home.  CSW consulted for ongoing substance abuse.  Per note from PCP at [facility] - pt will not be allowed back at clinic.   CM was unable to make appt with both [facility] and [facility].  CM left both [facility] and [facility] clinic information on AVS.  CM will continue to follow for possible discharge needs including possible MATCH   Expected Discharge Date:                  Expected Discharge Plan:  Home/Self Care  In-House Referral:  Clinical Social Work  Discharge planning Services  CM Consult  Post Acute Care Choice:    Choice offered to:     DME Arranged:    DME Agency:     HH Arranged:    HH Agency:     Status of Service:     If discussed at [employer], dates discussed:    Additional Comments:  Cherylann Parr, RN 03/03/2018, 4:26 PM

## 2018-03-03 NOTE — H&P (Addendum)
History & Physical    Patient ID: Brian Ford MRN: [id number], DOB/AGE: 1966-06-28   Admit date: 03/03/2018   Primary Physician: No primary care provider on file. Primary Cardiologist: Brian Ford  Patient Profile    52 yo male with PMH of CAD s/p stent to RCA ('12), BMS to LAD ('15), HTN, HL, Tobacco use, Cocaine use and depression who presented with chest pain and CODE STEMI in the field.   Past Medical History   Past Medical History:  Diagnosis Date  . Anxiety   . CAD (coronary artery disease) 2012   a. PCI of 100% RCA (2012) b. s/p BMS to LAD  . Crack cocaine use   . Depression   . GERD (gastroesophageal reflux disease)   . Heart attack (HCC)    x2  . Hyperlipidemia   . Hypertension   . Normocytic anemia   . ST elevation myocardial infarction (STEMI) of inferior wall (HCC) 2012  . Tobacco abuse     Past Surgical History:  Procedure Laterality Date  . CORONARY ANGIOPLASTY    . LEFT HEART CATHETERIZATION WITH CORONARY ANGIOGRAM N/A 05/19/2014   Procedure: LEFT HEART CATHETERIZATION WITH CORONARY ANGIOGRAM;  Surgeon: Brian Lex, MD;  Location[facility] C[facility] LAB;  Service: Cardiovascular;  Laterality: N/A;  . NOSE SURGERY    . PERCUTANEOUS CORONARY STENT INTERVENTION (PCI-S)  05/19/2014   Procedure: PERCUTANEOUS CORONARY STENT INTERVENTION (PCI-S);  Surgeon: Brian Lex, MD;  Location: [facility] CATH LAB;  Service: Cardiovascular;;  BMS to Mid LAD     Allergies  No Known Allergies  History of Present Illness    Brian Ford is a 52 yo male with PMH of CAD s/p stent to RCA ('12), BMS to LAD ('15), HTN, HL, Tobacco use, Cocaine use and depression. He was last seen in 2015 when he presented with CP and underwent cath with BMS placed to the mLAD by Dr. Herbie Ford. Noted issues with compliance at that time. He was sent home on DAPT with ASA/plavix. Did not follow up in the office as scheduled.   Reports this morning he developed crushing chest pain about 2 hours prior to  arriving to the ED. States he did $20 rock of cocaine today, then developed chest pain. [name] called Code STEMI on scene. EKG showed acute ST elevation in inferior leads with reciprocal depression in aVL. He took 5 ASA prior to EMS arrival. Refused nitro with EMS. Reported to be very combative on scene.   On arrival to the ED he was agitated and yelling at staff. Stating "I'm having a heart attack". EKG on arrival showed continued ST elevation in inferior leads. He was given 4000 heparin, and 2mg  ativan. He was evaluated by Dr. Excell Ford and brought to the cath lab for emergent cardiac cath.   Home Medications    Prior to Admission medications   Medication Sig Start Date End Date Taking? Authorizing Provider  aspirin EC 81 MG tablet Take 1 tablet (81 mg total) by mouth daily. 07/22/17   Jacquelin Hawking, PA-C  omeprazole (PRILOSEC) 40 MG capsule Take 1 capsule (40 mg total) by mouth daily. Patient not taking: Reported on 09/01/2017 07/22/17   Jacquelin Hawking, PA-C  simvastatin (ZOCOR) 20 MG tablet Take 1 tablet (20 mg total) by mouth at bedtime. Patient not taking: Reported on 09/01/2017 07/22/17   Jacquelin Hawking, PA-C    Family History    Family History  Problem Relation Age of Onset  . Hypertension Mother   .  Heart disease Mother   . Stroke Mother   . Diabetes Sister   . Diabetes Brother     Social History    Social History   Socioeconomic History  . Marital status: Single    Spouse name: Not on file  . Number of children: Not on file  . Years of education: Not on file  . Highest education level: Not on file  Occupational History  . Occupation: unemployed  Social Needs  . Financial resource strain: Not on file  . Food insecurity:    Worry: Not on file    Inability: Not on file  . Transportation needs:    Medical: Not on file    Non-medical: Not on file  Tobacco Use  . Smoking status: Current Every Day Smoker    Packs/day: 1.00    Years: 35.00    Pack  years: 35.00    Types: Cigarettes  . Smokeless tobacco: Never Used  Substance and Sexual Activity  . Alcohol use: No  . Drug use: Yes    Types: "Crack" cocaine, Oxycodone, Benzodiazepines, Cocaine, Marijuana    Comment: ?- pt refused to discuss  . Sexual activity: Not on file  Lifestyle  . Physical activity:    Days per week: Not on file    Minutes per session: Not on file  . Stress: Not on file  Relationships  . Social connections:    Talks on phone: Not on file    Gets together: Not on file    Attends religious service: Not on file    Active member of club or organization: Not on file    Attends meetings of clubs or organizations: Not on file    Relationship status: Not on file  . Intimate partner violence:    Fear of current or ex partner: Not on file    Emotionally abused: Not on file    Physically abused: Not on file    Forced sexual activity: Not on file  Other Topics Concern  . Not on file  Social History Narrative  . Not on file     Review of Systems    See HPI  All other systems reviewed and are otherwise negative except as noted above.  Physical Exam    Blood pressure (!) 139/121, resp. rate (!) 22, height 5\' 5"  (1.651 m), weight 165 lb (74.8 kg), SpO2 100 %.  General: Agitated, and combative. Diaphoretic Psych: Normal affect. Neuro: Alert and oriented X 3. Moves all extremities spontaneously. HEENT: Normal  Neck: Supple without bruits or JVD. Lungs:  Resp regular and unlabored, CTA. Heart: RRR no s3, s4, or murmurs. Abdomen: Soft, non-tender, non-distended, BS + x 4.  Extremities: No clubbing, cyanosis or edema. DP/PT/Radials 2+ and equal bilaterally.  Labs    Troponin [facility] of [facility] Test) Recent Labs    03/03/18 1415  TROPIPOC 0.58*   No results for input(s): CKTOTAL, CKMB, TROPONINI in the last 72 hours. Lab Results  Component Value Date   WBC 7.6 05/20/2014   HGB 15.0 03/03/2018   HCT 44.0 03/03/2018   MCV 83.5 05/20/2014   PLT 203  05/20/2014    Recent Labs  Lab 03/03/18 1423  NA 138  K 5.5*  CL 108  BUN 24*  CREATININE 1.40*  GLUCOSE 124*   Lab Results  Component Value Date   CHOL 182 06/06/2017   HDL 50 06/06/2017   LDLCALC 117 (H) 06/06/2017   TRIG 77 06/06/2017   No results found for:  DDIMER   Radiology Studies    No results found.  ECG & Cardiac Imaging    EKG: SR with known RBBB and acute ST elevation in inferior leads  Assessment & Plan    52 yo male with PMH of CAD s/p stent to RCA ('12), BMS to LAD ('15), HTN, HL, Tobacco use, Cocaine use and depression who presented with chest pain and CODE STEMI in the field.   1. Acute inferior STEMI: Developed chest pain after doing cocaine today. Took 5 ASA prior to EMS arrival. Given 4000 units heparin, and 2mg  ativan in the ED. Brought directly to the cath lab for emergent cardiac cath. Further recommendations post cath.   2. Polysubstance abuse: stating he wants help with his drug use on admission. Will address post cath.   3. Noncompliance: Hx of the same. Will need to stress the importance of DAPT.    Severity of Illness: The appropriate patient status for this patient is INPATIENT. Inpatient status is judged to be reasonable and necessary in order to provide the required intensity of service to ensure the patient's safety. The patient's presenting symptoms, physical exam findings, and initial radiographic and laboratory data in the context of their chronic comorbidities is felt to place them at high risk for further clinical deterioration. Furthermore, it is not anticipated that the patient will be medically stable for discharge from the hospital within 2 midnights of admission. The following factors support the patient status of inpatient.   " The patient's presenting symptoms include chest pain. " The worrisome physical exam findings include diaphoretic. " The initial radiographic and laboratory data are worrisome because of EKG with acute  inferior STEMI. " The chronic co-morbidities include HTN, HL, and polysubstance abuse.  * I certify that at the point of admission it is my clinical judgment that the patient will require inpatient hospital care spanning beyond 2 midnights from the point of admission due to high intensity of service, high risk for further deterioration and high frequency of surveillance required.*   Signed, Laverda Page, NP-C Pager [phone] 03/03/2018, 2:35 PM   Patient seen, examined. Available data reviewed. Agree with findings, assessment, and plan as outlined by Laverda Page, NP-C.  The patient is an alert but agitated male and moderate distress secondary to chest pain.  JVP is normal, lungs are clear, heart is regular rate and rhythm with no murmur or gallop, abdomen soft nontender, extremities show no edema, skin is warm and dry without rash.  His history is reviewed.  He has had multiple PCI procedures in the past, unfortunately with ongoing polysubstance abuse.  He used crack cocaine today and developed severe substernal chest pain approximately 1 to 2 hours ago.  A code STEMI is called from the field.  EKG demonstrates right bundle branch block with a pattern of acute inferior injury.  I have recommended emergency cardiac catheterization and primary PCI.  Emergency implied consent is obtained.  We will review secondary risk reduction measures, social work consult for resources to help with his drug addiction, and post MI medical therapy.  Tonny Bollman, M.D. 03/03/2018 3:21 PM

## 2018-03-03 NOTE — ED Notes (Signed)
Patient was given 400 units Heparin Prior to going to cath lab

## 2018-03-03 NOTE — ED Triage Notes (Signed)
C/o chest pain onset 1 1/2 hour ago, states he smoked cocaine x 2 this am , states   He has a history of 2 stents in the past, patient is very restless, was given 81mg  asa x 5 morphine 10 mg IV and 2 ntg per ems.

## 2018-03-03 NOTE — Plan of Care (Signed)
Patient alert and oriented.  Denies chest pain, shortness of breath, numbness or tingling in right arm.  Right access shows no signs of bleeding at this time.  Will continue to reinforce the need to maintain extremity elevation and not to use that arm due to increase risk of bleeding related to cath insertion site.  Discussed in length why patient needs to comply with this and to keep monitor leads and blood pressure cuff on.  Patient states that he understands but does not like them on and agrees to keep them on until he talks with the doctor in the am.  Monitoring continues.

## 2018-03-03 NOTE — ED Provider Notes (Signed)
[facility] EMERGENCY DEPARTMENT Provider Note   CSN: [id number] Arrival date & time: 03/03/18  1410     History   Chief Complaint Chief Complaint  Patient presents with  . Chest Pain    HPI Brian Ford is a 52 y.o. male.  The history is provided by the patient and medical records. No language interpreter was used.  Chest Pain   This is a recurrent problem. The current episode started 1 to 2 hours ago. The problem occurs constantly. The problem has been rapidly worsening. The pain is associated with exertion. The pain is present in the substernal region. The pain is at a severity of 10/10. The pain is severe. The quality of the pain is described as exertional, heavy and pressure-like. The pain does not radiate. Duration of episode(s) is 2 hours. The symptoms are aggravated by exertion. Associated symptoms include diaphoresis, exertional chest pressure, nausea and shortness of breath. Pertinent negatives include no abdominal pain, no back pain, no cough, no fever, no headaches, no leg pain, no lower extremity edema, no palpitations and no vomiting. He has tried nothing for the symptoms. The treatment provided no relief.  His past medical history is significant for CAD and MI.    Past Medical History:  Diagnosis Date  . Anxiety   . CAD (coronary artery disease) 2012   a. PCI of 100% RCA (2012) b. s/p BMS to LAD  . Crack cocaine use   . Depression   . GERD (gastroesophageal reflux disease)   . Heart attack (HCC)    x2  . Hyperlipidemia   . Hypertension   . Normocytic anemia   . ST elevation myocardial infarction (STEMI) of inferior wall (HCC) 2012  . Tobacco abuse     Patient Active Problem List   Diagnosis Date Noted  . Abnormal CT of the chest 07/22/2017  . Pulmonary nodules 07/22/2017  . Pulmonary emphysema (HCC) 07/22/2017  . Aortic atherosclerosis (HCC) 07/22/2017  . Coronary artery calcification 07/22/2017  . Cigarette nicotine dependence with  nicotine-induced disorder 07/22/2017  . Presence of Bare Metal stent in LAD coronary artery 05/20/2014  . Hyperlipidemia   . Normocytic anemia   . CAD (coronary artery disease)   . Acute coronary syndrome (HCC) 05/19/2014  . MI, old 05/19/2014  . Cocaine abuse (HCC) 05/19/2014  . Tobacco abuse 05/19/2014  . Acute myocardial infarction involving left coronary artery (HCC) 05/19/2014  . CAD S/P percutaneous coronary angioplasty 12/25/2010    Past Surgical History:  Procedure Laterality Date  . CORONARY ANGIOPLASTY    . LEFT HEART CATHETERIZATION WITH CORONARY ANGIOGRAM N/A 05/19/2014   Procedure: LEFT HEART CATHETERIZATION WITH CORONARY ANGIOGRAM;  Surgeon: Marykay Lex, MD;  Location: [facility] CATH LAB;  Service: Cardiovascular;  Laterality: N/A;  . NOSE SURGERY    . PERCUTANEOUS CORONARY STENT INTERVENTION (PCI-S)  05/19/2014   Procedure: PERCUTANEOUS CORONARY STENT INTERVENTION (PCI-S);  Surgeon: Marykay Lex, MD;  Location: [facility] CATH LAB;  Service: Cardiovascular;;  BMS to Mid LAD        Home Medications    Prior to Admission medications   Medication Sig Start Date End Date Taking? Authorizing Provider  aspirin EC 81 MG tablet Take 1 tablet (81 mg total) by mouth daily. 07/22/17   Jacquelin Hawking, PA-C  omeprazole (PRILOSEC) 40 MG capsule Take 1 capsule (40 mg total) by mouth daily. Patient not taking: Reported on 09/01/2017 07/22/17   Jacquelin Hawking, PA-C  simvastatin (ZOCOR) 20 MG tablet Take  1 tablet (20 mg total) by mouth at bedtime. Patient not taking: Reported on 09/01/2017 07/22/17   Jacquelin Hawking, PA-C    Family History Family History  Problem Relation Age of Onset  . Hypertension Mother   . Heart disease Mother   . Stroke Mother   . Diabetes Sister   . Diabetes Brother     Social History Social History   Tobacco Use  . Smoking status: Current Every Day Smoker    Packs/day: 1.00    Years: 35.00    Pack years: 35.00    Types: Cigarettes  . Smokeless  tobacco: Never Used  Substance Use Topics  . Alcohol use: No  . Drug use: Yes    Types: "Crack" cocaine, Oxycodone, Benzodiazepines    Comment: ?- pt refused to discuss     Allergies   Patient has no known allergies.   Review of Systems Review of Systems  Constitutional: Positive for diaphoresis and fatigue. Negative for chills and fever.  HENT: Negative for congestion.   Respiratory: Positive for chest tightness and shortness of breath. Negative for cough, wheezing and stridor.   Cardiovascular: Positive for chest pain. Negative for palpitations and leg swelling.  Gastrointestinal: Positive for nausea. Negative for abdominal pain, constipation, diarrhea and vomiting.  Genitourinary: Negative for flank pain.  Musculoskeletal: Negative for back pain, neck pain and neck stiffness.  Neurological: Positive for light-headedness. Negative for headaches.  Psychiatric/Behavioral: Negative for agitation.  All other systems reviewed and are negative.    Physical Exam Updated Vital Signs BP 100/76   Pulse 82   Resp 17   Ht 5\' 5"  (1.651 m)   Wt 74.8 kg (165 lb)   SpO2 100%   BMI 27.46 kg/m   Physical Exam  Constitutional: He is oriented to person, place, and time. He appears well-developed and well-nourished.  Non-toxic appearance. He does not appear ill. No distress.  HENT:  Head: Normocephalic and atraumatic.  Nose: Nose normal.  Mouth/Throat: Oropharynx is clear and moist. No oropharyngeal exudate.  Eyes: Pupils are equal, round, and reactive to light. Conjunctivae and EOM are normal.  Neck: Neck supple.  Cardiovascular: Normal rate, regular rhythm and intact distal pulses.  No murmur heard. Pulmonary/Chest: Effort normal and breath sounds normal. No respiratory distress. He has no wheezes. He has no rales. He exhibits no tenderness.  Abdominal: Soft. There is no tenderness.  Musculoskeletal: He exhibits no edema or tenderness.  Neurological: He is alert and oriented to  person, place, and time. No sensory deficit. He exhibits normal muscle tone.  Skin: Skin is warm and dry. Capillary refill takes less than 2 seconds. He is not diaphoretic. No erythema. No pallor.  Psychiatric: He has a normal mood and affect.  Nursing note and vitals reviewed.    ED Treatments / Results  Labs (all labs ordered are listed, but only abnormal results are displayed) Labs Reviewed  COMPREHENSIVE METABOLIC PANEL - Abnormal; Notable for the following components:      Result Value   CO2 20 (*)    Glucose, Bld 123 (*)    Creatinine, Ser 1.45 (*)    GFR calc non Af Amer 54 (*)    All other components within normal limits  LIPID PANEL - Abnormal; Notable for the following components:   Cholesterol 216 (*)    Triglycerides 150 (*)    HDL 39 (*)    LDL Cholesterol 147 (*)    All other components within normal limits  HEMOGLOBIN A1C -  Abnormal; Notable for the following components:   Hgb A1c MFr Bld 5.7 (*)    All other components within normal limits  CBC - Abnormal; Notable for the following components:   WBC 15.9 (*)    All other components within normal limits  APTT - Abnormal; Notable for the following components:   aPTT 39 (*)    All other components within normal limits  TROPONIN I - Abnormal; Notable for the following components:   Troponin I 0.44 (*)    All other components within normal limits  POCT I-STAT, CHEM 8 - Abnormal; Notable for the following components:   Potassium 5.5 (*)    BUN 24 (*)    Creatinine, Ser 1.40 (*)    Glucose, Bld 124 (*)    Calcium, Ion 1.01 (*)    All other components within normal limits  POCT I-STAT TROPONIN I - Abnormal; Notable for the following components:   Troponin i, poc 0.58 (*)    All other components within normal limits  MRSA PCR SCREENING  PROTIME-INR  HIV ANTIBODY (ROUTINE TESTING)  CBC  CREATININE, SERUM  TROPONIN I  TROPONIN I  TROPONIN I  POCT ACTIVATED CLOTTING TIME    EKG EKG  Interpretation  Date/Time:  Tuesday March 03 2018 14:16:10 EDT Ventricular Rate:  92 PR Interval:    QRS Duration: 117 QT Interval:  331 QTC Calculation: 410 R Axis:   76 Text Interpretation:  Sinus rhythm LAE, consider biatrial enlargement Nonspecific intraventricular conduction delay Inferior infarct, acute (RCA) Anterior infarct, old Probable RV involvement, suggest recording right precordial leads ** ** ACUTE MI / STEMI ** ** Confirmed by Theda Belfast ([id number]) on 03/03/2018 3:57:20 PM   Radiology No results found.  Procedures Procedures (including critical care time)  CRITICAL CARE Performed by: Canary Brim Tegeler Total critical care time: 30 minutes Critical care time was exclusive of separately billable procedures and treating other patients. STEMI activated, positive troponin, started on heparin and taken to the Cath Lab for PCI. Critical care was necessary to treat or prevent imminent or life-threatening deterioration. Critical care was time spent personally by me on the following activities: development of treatment plan with patient and/or surrogate as well as nursing, discussions with consultants, evaluation of patient's response to treatment, examination of patient, obtaining history from patient or surrogate, ordering and performing treatments and interventions, ordering and review of laboratory studies, ordering and review of radiographic studies, pulse oximetry and re-evaluation of patient's condition.   Medications Ordered in ED Medications  LORazepam (ATIVAN) 2 MG/ML injection (has no administration in time range)  aspirin EC tablet 81 mg (has no administration in time range)  nitroGLYCERIN (NITROSTAT) SL tablet 0.4 mg (has no administration in time range)  acetaminophen (TYLENOL) tablet 650 mg (has no administration in time range)  ondansetron (ZOFRAN) injection 4 mg (has no administration in time range)  heparin injection 5,000 Units (has no administration in  time range)  ticagrelor (BRILINTA) tablet 90 mg (has no administration in time range)  atorvastatin (LIPITOR) tablet 80 mg (has no administration in time range)  labetalol (NORMODYNE,TRANDATE) injection 10 mg (has no administration in time range)  hydrALAZINE (APRESOLINE) injection 5 mg (has no administration in time range)  0.9 %  sodium chloride infusion ( Intravenous IV Pump Association 03/03/18 1548)  sodium chloride flush (NS) 0.9 % injection 3 mL (has no administration in time range)  sodium chloride flush (NS) 0.9 % injection 3 mL (has no administration in time range)  0.9 %  sodium chloride infusion (has no administration in time range)  oxyCODONE (Oxy IR/ROXICODONE) immediate release tablet 5-10 mg (has no administration in time range)  morphine 2 MG/ML injection 2 mg (has no administration in time range)  tirofiban (AGGRASTAT) infusion 50 mcg/mL 250 mL (0.075 mcg/kg/min  74.8 kg Intravenous IV Pump Association 03/03/18 1552)  LORazepam (ATIVAN) injection 2 mg ( Intravenous MAR Unhold 03/03/18 1542)  0.9 %  sodium chloride infusion (50 mL/hr  Rate/Dose Change 03/03/18 1511)  tirofiban (AGGRASTAT) infusion 50 mcg/mL 250 mL (0.075 mcg/kg/min  74.8 kg Intravenous New Bag/Given 03/03/18 1447)     Initial Impression / Assessment and Plan / ED Course  I have reviewed the triage vital signs and the nursing notes.  Pertinent labs & imaging results that were available during my care of the patient were reviewed by me and considered in my medical decision making (see chart for details).     SAFWAN TOMEI is a 52 y.o. male for the past medical history significant for polysubstance abuse including cocaine use, CAD status post PCI, and hyperlipidemia who presents with chest pain.  Patient's EKG was transmitted in route and a code STEMI was activated.  Patient reports that several hours ago he began having severe chest pain.  He described as pressure in his central chest.  It does not radiate but  he associated shortness of breath, nausea, and diaphoresis.  He reports this feels "the same" as prior MI.  He reports that he did use cocaine today but he does not report this feels like his cocaine chest pain.  He is concerned about MI.  He denies any recent trauma.  He denies any urinary symptoms or GI symptoms.  He denies other complaints.    On arrival, EKG was performed which still showed MI.  On my exam, lungs were clear and chest was nontender.  Abdomen was nontender.  Patient had pulses in both upper extremities.  No evidence of trauma was seen.  Cardiology came to the bedside and evaluate him.  They feel the patient needs to go to the Cath Lab for likely intervention.    Patient given Ativan for agitation.    Patient started on heparin and was taken to the Cath Lab.   Patient quickly taken to Cath Lab.  After patient left, his troponin returned elevated.  Patient's creatinine was slightly elevated.  Patient be admitted after the Cath Lab for further management of his MI.   Final Clinical Impressions(s) / ED Diagnoses   Final diagnoses:  ST elevation myocardial infarction involving left anterior descending (LAD) coronary artery [facility])    ED Discharge Orders    None      Clinical Impression: 1. ST elevation myocardial infarction involving left anterior descending (LAD) coronary artery (HCC)     Disposition: Admit  This note was prepared with assistance of Dragon voice recognition software. Occasional wrong-word or sound-a-like substitutions may have occurred due to the inherent limitations of voice recognition software.      Tegeler, Canary Brim, MD 03/03/18 [phone]

## 2018-03-03 NOTE — Progress Notes (Signed)
During report, patient asked lab technician to give him his belonging bags in which he proceeded to retrieve a pack of cigarettes and a lighter.  Patient had been told previously by Marchelle Folks RN that he was not allowed to smoke in the facility or on the grounds and that he would have to keep the cigarettes and lighter in his belonging bags or that these items  would be confiscated.  Both Marchelle Folks and myself entered the room to which the patient attempted to hide the cigarettes and lighter under his sheet.  I asked the patient to give me the cigarettes and lighter and again reiterated that this was a smoke free facility and that smoking was not allowed on the premises.  Patient was agitated but did comply.  Offered nicotine patch to which patient declined at this time.  Cigarettes and lighter were placed in a zip lock bag and placed in patient's chart.  Monitoring will continue.

## 2018-03-03 NOTE — Progress Notes (Signed)
Responded to code stemi. to support patient and staff. Pt went strait to cath lab.  Per EMT pt had spoken to his mom but she is not coming. The number on system for mother is different from the one give me and ED staff upon arrival.  The number given [place] for Mom  is [phone].  No direct intervention done. Chaplain available as needed.  Venida Jarvis, [place], [facility], Pager [phone]

## 2018-03-03 NOTE — Progress Notes (Signed)
Visited with Brian Ford tonight.  During our conversation he shared that he would like to stop smoking cigarettes and cocaine.  He states that he has smoked cocaine for 30 years and needs help quitting.  He states that he would like to be put some place.  He is requesting a Child psychotherapist at least talk with him about resources.  He has requested prayer this evening for his addiction and for his mother who will only let him care for her.  He stated his neighbor is caring for her this evening.  He is requesting a bible, some paper, a pen and some reading glasses.  I provided all except reading glasses because those we don't have.  Compassionate presence and listening and prayer provided.  Thank you for caring for him.    03/03/18 1939  Clinical Encounter Type  Visited With Patient  Visit Type Initial;Spiritual support;Psychological support  Spiritual Encounters  Spiritual Needs Prayer  Stress Factors  Patient Stress Factors Exhausted;Family relationships  Family Stress Factors Family relationships

## 2018-03-03 NOTE — Progress Notes (Addendum)
CRITICAL VALUE ALERT  Critical Value:  *troponin >65 **       Date & Time Notied:  03/03/2018@2236   Provider Notified: Dr. Shirlee Latch @0045   Orders Received/Actions taken: see orders

## 2018-03-04 ENCOUNTER — Encounter (HOSPITAL_COMMUNITY): Payer: Self-pay | Admitting: Cardiovascular Disease

## 2018-03-04 ENCOUNTER — Inpatient Hospital Stay (HOSPITAL_COMMUNITY): Payer: Self-pay

## 2018-03-04 DIAGNOSIS — Z9861 Coronary angioplasty status: Secondary | ICD-10-CM

## 2018-03-04 DIAGNOSIS — I340 Nonrheumatic mitral (valve) insufficiency: Secondary | ICD-10-CM

## 2018-03-04 DIAGNOSIS — Z720 Tobacco use: Secondary | ICD-10-CM

## 2018-03-04 DIAGNOSIS — F1410 Cocaine abuse, uncomplicated: Secondary | ICD-10-CM

## 2018-03-04 LAB — CBC
HEMATOCRIT: 43.2 % (ref 39.0–52.0)
HEMOGLOBIN: 14.1 g/dL (ref 13.0–17.0)
MCH: 27.5 pg (ref 26.0–34.0)
MCHC: 32.6 g/dL (ref 30.0–36.0)
MCV: 84.4 fL (ref 78.0–100.0)
Platelets: 229 10*3/uL (ref 150–400)
RBC: 5.12 MIL/uL (ref 4.22–5.81)
RDW: 13.7 % (ref 11.5–15.5)
WBC: 11.1 10*3/uL — ABNORMAL HIGH (ref 4.0–10.5)

## 2018-03-04 LAB — BASIC METABOLIC PANEL
ANION GAP: 10 (ref 5–15)
BUN: 17 mg/dL (ref 6–20)
CALCIUM: 8.5 mg/dL — AB (ref 8.9–10.3)
CO2: 23 mmol/L (ref 22–32)
Chloride: 105 mmol/L (ref 98–111)
Creatinine, Ser: 1.24 mg/dL (ref 0.61–1.24)
Glucose, Bld: 106 mg/dL — ABNORMAL HIGH (ref 70–99)
POTASSIUM: 3.7 mmol/L (ref 3.5–5.1)
Sodium: 138 mmol/L (ref 135–145)

## 2018-03-04 LAB — ECHOCARDIOGRAM COMPLETE
HEIGHTINCHES: 67 in
WEIGHTICAEL: 2634.94 [oz_av]

## 2018-03-04 LAB — LIPID PANEL
CHOLESTEROL: 182 mg/dL (ref 0–200)
HDL: 36 mg/dL — ABNORMAL LOW (ref >40)
LDL CALC: 117 mg/dL — AB (ref 0–99)
Total CHOL/HDL Ratio: 5.1 RATIO
Triglycerides: 144 mg/dL (ref <150)
VLDL: 29 mg/dL (ref 0–40)

## 2018-03-04 LAB — HIV ANTIBODY (ROUTINE TESTING W REFLEX): HIV Screen 4th Generation wRfx: NONREACTIVE

## 2018-03-04 LAB — TROPONIN I: Troponin I: >65 ng/mL (ref <0.03)

## 2018-03-04 MED ORDER — NICOTINE 21 MG/24HR TD PT24
21.0000 mg | MEDICATED_PATCH | Freq: Every day | TRANSDERMAL | Status: DC
  Administered 2018-03-04 (×2): 21 mg via TRANSDERMAL
  Filled 2018-03-04 (×2): qty 1

## 2018-03-04 MED ORDER — TICAGRELOR 90 MG PO TABS: 90.0000 mg | ORAL_TABLET | Freq: Two times a day (BID) | ORAL | 11 refills | Status: DC

## 2018-03-04 MED ORDER — NITROGLYCERIN 0.4 MG SL SUBL: 0.4000 mg | SUBLINGUAL_TABLET | SUBLINGUAL | 3 refills | Status: DC | PRN

## 2018-03-04 MED ORDER — TICAGRELOR 90 MG PO TABS: 90.0000 mg | ORAL_TABLET | Freq: Two times a day (BID) | ORAL | 0 refills | Status: DC

## 2018-03-04 MED ORDER — ALUM & MAG HYDROXIDE-SIMETH 200-200-20 MG/5ML PO SUSP: 30.0000 mL | ORAL | Status: DC | PRN

## 2018-03-04 MED ORDER — ATORVASTATIN CALCIUM 80 MG PO TABS: 80.0000 mg | ORAL_TABLET | Freq: Every day | ORAL | 3 refills | Status: DC

## 2018-03-04 MED ORDER — ALPRAZOLAM 0.5 MG PO TABS
1.0000 mg | ORAL_TABLET | Freq: Three times a day (TID) | ORAL | Status: DC | PRN
  Administered 2018-03-04 (×3): 1 mg via ORAL
  Filled 2018-03-04 (×3): qty 2

## 2018-03-04 MED ORDER — ALUM & MAG HYDROXIDE-SIMETH 200-200-20 MG/5ML PO SUSP
30.0000 mL | ORAL | Status: DC | PRN
  Administered 2018-03-04 (×3): 30 mL via ORAL
  Filled 2018-03-04 (×3): qty 30

## 2018-03-04 MED ORDER — ASPIRIN EC 81 MG PO TBEC: 81.0000 mg | DELAYED_RELEASE_TABLET | Freq: Every day | ORAL | 3 refills | Status: AC

## 2018-03-04 MED ORDER — NITROGLYCERIN IN D5W 200-5 MCG/ML-% IV SOLN: 0.0000 ug/min | INTRAVENOUS | Status: DC

## 2018-03-04 MED ORDER — CARVEDILOL 3.125 MG PO TABS: 3.1250 mg | ORAL_TABLET | Freq: Two times a day (BID) | ORAL | 11 refills | Status: DC

## 2018-03-04 NOTE — Progress Notes (Signed)
  Echocardiogram 2D Echocardiogram has been performed.  Pieter Partridge 03/04/2018, 8:40 AM

## 2018-03-04 NOTE — Progress Notes (Signed)
Patient up out of bed, disconnected IV's, and monitor.  Explained that he needed to call for assistance and to not disconnect these things due to safety reasons.  Patient stated he didn't care that he was going to the bathroom, and he entered the bathroom closing the door.  When patient came out, he refused to let me put the monitor back on, that he was going out to smoke and wanted his cigarettes.  Explained the risks as to why he could not go out, and he states that he is anyway, that our rules were stupid.  Had Charge nurse Molly Maduro) come speak to patient and he explained that he could not smoke within the facility or on the grounds.  Told patient that we would call the doctor to see if an order could be obtained.  Offered to get patient a nicotine patch, and patient refused.  Spoke to Dr. Shirlee Latch, orders for nicotine patch, and xanax were obtained, but no orders to smoke.  Patient stated that he was going anyway and that we had to give him his cigarettes, refused to sign AMA sheet and snatched cigarettes from [place], headed into the bathroom and preceded to smoke his cigarette.  Security was called.  Patient then opened the door to the bathroom stating that if he put the cigarette out, could he stay.  Patient was then cooperative, agreed to have a nicotine patch.  Security arrived a few minutes later, explained to patient that he had to surrender the cigarettes which he had already done, and that smoking was not allowed in the facility or facility grounds.  Patient stated that he felt better and that he had planned to do just what he did by snatching the cigarettes and going into the bathroom.  Nitro gtt held at this time since "chest pain" had subsided.

## 2018-03-04 NOTE — Progress Notes (Signed)
Pts wants to leave AMA. NP on-call notified. Waiting for NP to come to bedside. Pt removed EKG monitor and waiting in room.

## 2018-03-04 NOTE — Progress Notes (Signed)
After new IV placement pt walked off unit without permission and was missing for proximally . This RN notified security and proceeded to search hospital. Pt was found on the side of the road, outside the ED, smoking a cigarette with another man outside. This RN informed pt that he needs to decide if he wants to leave AMA or stay at the hospital.  RN explained to pt that his actions are againts [facility] policy and it was not okay to leave the hospital to smoke. PT stated "I was going to come back and I really want to stay". RN walked pt back to 2H09. Tresa Endo MD notified of situation. Will continue to monitor pt.

## 2018-03-04 NOTE — Progress Notes (Signed)
Pt very anxious pacing in room, This RN walked pt several times around unit. After walking pt had visitor that brought cigarettes with lighter for pt. When friend left Pt willing told RN and handed cigarettes over to be placed in chart. Currently pt's shadow chart has two packs of cigarettes and lighter in a lab bag. Pt was given PRN medication to help with anxiety. Will continue to monitor.

## 2018-03-04 NOTE — Progress Notes (Signed)
Pt has decided to stay and not leave AMA. Pt connected to HR monitor and more cooperative with pt care. Orders placed to restart IV. Will continue to monitor.

## 2018-03-04 NOTE — Progress Notes (Signed)
Called by nurse who states patient will be leaving AMA, requesting Brilinta card. Card provided to patient. Encouraged patient to stay, discussed additional resources that would be available to him if he did not leave AMA. He stated he would think about it.

## 2018-03-04 NOTE — Progress Notes (Signed)
CARDIAC REHAB PHASE I   PRE:  Rate/Rhythm: 82 SR    BP: sitting     SaO2:   MODE:  Ambulation: 410 ft   POST:  Rate/Rhythm: 90 SR    BP: sitting 111/78     SaO2:   Pt just returned from smoking a cigarette. He ambulated with me but is complaining of chest burning. He sts he has severe indigestion. He also sts his lungs are hurting like they did yesterday. He is asking for mylanta but he just took some a while ago. Pt was willing to talk about his heart and risk factor modification. He understands the importance of Brilinta and has the copay card. He does not sound confident he can take meds twice a day and he is unsure about calling Brilinta company for further support. We discussed smoking cessation and crack cessation. He recognizes he needs help and asked for CSW. He would like drug rehab at [facility] in [place]. I gave him fake cigarette, which he enjoyed. We discussed stress relief as well ie deep breathing, prayer, Scripture, distractions, outside time. He was receptive in part. I gave him low sodium diets and will refer to [facility]. He does not have a car. Discussed calling MD if he notices fluid accumulation.  [phone]   [name] CES, ACSM 03/04/2018 2:11 PM

## 2018-03-04 NOTE — Progress Notes (Signed)
Progress Note  Patient Name: Brian Ford Date of Encounter: 03/04/2018  Primary Cardiologist: Dr. Diona Browner  Subjective   No recurrrent chest pain.  Wanting to sign out AMA.  Inpatient Medications    Scheduled Meds: . aspirin EC  81 mg Oral Daily  . atorvastatin  80 mg Oral q1800  . heparin  5,000 Units Subcutaneous Q8H  . nicotine  21 mg Transdermal Daily  . sodium chloride flush  3 mL Intravenous Q12H  . ticagrelor  90 mg Oral BID   Continuous Infusions: . sodium chloride Stopped (03/04/18 0548)  . nitroGLYCERIN Stopped (03/04/18 0200)   PRN Meds: sodium chloride, acetaminophen, ALPRAZolam, alum & mag hydroxide-simeth, morphine injection, nitroGLYCERIN, ondansetron (ZOFRAN) IV, oxyCODONE, sodium chloride flush   Vital Signs    Vitals:   03/04/18 0500 03/04/18 0600 03/04/18 0700 03/04/18 0800  BP: 102/69 107/75 (!) 87/76   Pulse: 63 67 67 71  Resp: 17 12 18 13   Temp:      TempSrc:      SpO2: 90% 97% 99% 99%  Weight:      Height:        Intake/Output Summary (Last 24 hours) at 03/04/2018 1020 Last data filed at 03/04/2018 0600 Gross per 24 hour  Intake 837.63 ml  Output 550 ml  Net 287.63 ml    I/O since admission:   Filed Weights   03/03/18 1432 03/03/18 1545  Weight: 165 lb (74.8 kg) 164 lb 10.9 oz (74.7 kg)    Telemetry    Sinus at 67 - Personally Reviewed  ECG    ECG (independently read by me): NSR 61; Ant MI with T inversion V3-6, I, aVL  Physical Exam   BP (!) (P) 87/76 (BP Location: Right Arm)   Pulse 71   Temp 98 F (36.7 C) (Oral)   Resp 13   Ht 5\' 7"  (1.702 m)   Wt 164 lb 10.9 oz (74.7 kg)   SpO2 99%   BMI 25.79 kg/m  General: Alert, oriented, no distress.  Skin: normal turgor, no rashes, warm and dry HEENT: Normocephalic, atraumatic. Pupils equal round and reactive to light; sclera anicteric; extraocular muscles intact;  Nose without nasal septal hypertrophy Mouth/Parynx benign; Mallinpatti scale 3 Neck: No JVD, no  carotid bruits; normal carotid upstroke Lungs: clear to ausculatation and percussion; no wheezing or rales Chest wall: without tenderness to palpitation Heart: PMI not displaced, RRR, s1 s2 normal, 1/6 systolic murmur, no diastolic murmur, no rubs, gallops, thrills, or heaves Abdomen: soft, nontender; no hepatosplenomehaly, BS+; abdominal aorta nontender and not dilated by palpation. Back: no CVA tenderness Pulses 2+ Musculoskeletal: full range of motion, normal strength, no joint deformities Extremities: no clubbing cyanosis or edema, Homan's sign negative  Neurologic: grossly nonfocal; Cranial nerves grossly wnl Psychologic: Normal mood and affect   Labs    Chemistry Recent Labs  Lab 03/03/18 1410 03/03/18 1423 03/03/18 1940 03/04/18 0353  NA 141 138  --  138  K 4.0 5.5*  --  3.7  CL 108 108  --  105  CO2 20*  --   --  23  GLUCOSE 123* 124*  --  106*  BUN 16 24*  --  17  CREATININE 1.45* 1.40* 1.21 1.24  CALCIUM 9.1  --   --  8.5*  PROT 6.9  --   --   --   ALBUMIN 3.9  --   --   --   AST 26  --   --   --  ALT 23  --   --   --   ALKPHOS 71  --   --   --   BILITOT 0.6  --   --   --   GFRNONAA 54*  --  >60 >60  GFRAA >60  --  >60 >60  ANIONGAP 13  --   --  10     Hematology Recent Labs  Lab 03/03/18 1410 03/03/18 1423 03/03/18 1940 03/03/18 2238 03/04/18 0353  WBC 15.9*  --  13.9*  --  11.1*  RBC 5.40  --  5.27  --  5.12  HGB 15.0 15.0 14.7  --  14.1  HCT 45.5 44.0 44.2  --  43.2  MCV 84.3  --  83.9  --  84.4  MCH 27.8  --  27.9  --  27.5  MCHC 33.0  --  33.3  --  32.6  RDW 13.8  --  13.7  --  13.7  PLT 294  --  252 232 229    Cardiac Enzymes Recent Labs  Lab 03/03/18 1410 03/03/18 1940 03/03/18 2238 03/04/18 0353  TROPONINI 0.44* >65.00* >65.00* >65.00*    Recent Labs  Lab 03/03/18 1415  TROPIPOC 0.58*     BNPNo results for input(s): BNP, PROBNP in the last 168 hours.   DDimer No results for input(s): DDIMER in the last 168 hours.    Lipid Panel     Component Value Date/Time   CHOL 182 03/04/2018 0353   TRIG 144 03/04/2018 0353   HDL 36 (L) 03/04/2018 0353   CHOLHDL 5.1 03/04/2018 0353   VLDL 29 03/04/2018 0353   LDLCALC 117 (H) 03/04/2018 0353    Radiology    No results found.  Cardiac Studies    Prox LAD to Mid LAD lesion is 30% stenosed.  Mid RCA lesion is 100% stenosed.  Prox LAD lesion is 95% stenosed.  A drug-eluting stent was successfully placed using a STENT SIERRA 3.50 X 18 MM.  Post intervention, there is a 0% residual stenosis.  Post intervention, there is a 0% residual stenosis.  Ost 1st Diag lesion is 75% stenosed.  The left ventricular ejection fraction is 25-35% by visual estimate.  LV end diastolic pressure is moderately elevated.  There is severe left ventricular systolic dysfunction.   1.  Acute inferolateral STEMI likely secondary to critical stenosis in the proximal LAD which collateralizes a large dominant RCA 2.  Chronic total occlusion of the RCA within the previously implanted stent in the mid vessel 3.  Widely patent left main and left circumflex 4.  Severe segmental LV systolic dysfunction with LVEF estimated at 30% 5.  Successful PCI of the proximal LAD using a 3.5 x 18 mm Xience Sierra DES postdilated to high pressure with a 3.75 mm noncompliant balloon and overlapping the previously implanted bare-metal stent in the mid LAD  Recommend uninterrupted dual antiplatelet therapy with Aspirin 81mg  daily and Ticagrelor 90mg  twice daily for a minimum of 12 months (ACS - Class I recommendation).   **Pt will need social work and case manager involvement to help with medication and social service resources for treatment of his drug addiction**      Patient Profile     52 y.o. male with known CAD who presented yesterday with a STEMI after cocaine use.  Assessment & Plan    1. Day 1 s/p Anterior STEMI with PCI/DES stent to proximal LAD prior to previously placed  stent with jeopardized collaterals to distal RCA with  chronic RCA occlusion at prior stented segment. Trop > 65. Pt insisting on going outside to smoke and if not leaving AMA. Long discussion with patient regarding his MI, risk and LAD  with extensive collaterals to RCA.   He is aware of risk and potential for acute stent thrombosis if he does not take his DAPT.  Will try to obtain brilinta card for 1 month. Will need ASA 81 mg daily. Will give carvedilol 3.125 mg bid. If patient decides to stay will need to resume monitoring/  Time spent 60 minutes Signed, Lennette Bihari, MD, [facility] 03/04/2018, 10:20 AM

## 2018-03-04 NOTE — Progress Notes (Signed)
Patient decided to leave AMA. Dr. Tresa Endo, his nurse and myself has all discussed with the patient the risk of leaving AMA and possibility of heart attack, stroke and possibly death given his recent cardiac history. He displayed clear understand but is adamant he leave today AMA so he can take care his mother and smoke. He already left the hospital earlier today to smoke on the side of the road. Dr. Tresa Endo spent a hour this morning to try to convince to stay and make sure his heart is stable.   I called and discussed with Dr. Tresa Endo, he is high risk for stent thrombosis and death without DAPT. I have written him paper Rx for ASA, 3.125mg  BID Coreg, Lipitor 80mg  daily, Brilinta (both 30 day supply and 1 year supply), sublingual nitro. Patient signed the AMA form and left with his Rx.   Ramond Dial PA Pager: [phone]

## 2018-03-05 NOTE — Progress Notes (Signed)
Patient left AMA on 03/04/2018.  He has called the department over 4 times today 03/05/2018 requesting to speak with RN Devota Pace who cared for him yesterday to apologize for his behavior and leaving AMA.  He reports being remorseful and now was to adhere to plan of care.  I have requested his calls be sent to me and not to send to [facility] or the charge RN anymore.  He has my direct line and instructions to call me if he needs any thing.    He reports questions of medications prescribed at discharge and is unable to obtain them.  He reports to needing a follow up appointment.  He has been given information to schedule appointment case management left on his AVS and reports they cannot see him until September 2019 and is requesting help.   I have reviewed the medications scripts with [facility] that were listed in the last Note from [facility] and placed him in contact with [facility] Case Management to gain support getting Brilinta script filled at his local [facility] pharmacy.    Patient Brian Ford reports he needs help gaining follow up appointment as the only one he could obtain in [place] was in September 2019.  I explained I am not aware of this process but will look into it and if he is still having problems on Monday 03/09/2018 he can call me directly.     I gave patient instructions if he were to have any chest pain, shortness of breath, or any other concerns to call 911 and go the the emergency room.  Jacqulyn Cane RN, BSN, CCRN

## 2018-03-09 NOTE — Discharge Summary (Signed)
Patient Left Hospital AGAINST MEDICAL ADVICE    Patient ID: Brian Ford,  MRN: [id number], DOB/AGE: 52-Apr-1967 52 y.o.  Admit date: 03/03/2018 Discharge date: 03/09/2018  Primary Care Provider: No primary care provider on file. Primary Cardiologist: Brian Dell, MD  Discharge Diagnoses    Principal Problem:   ST elevation myocardial infarction involving left anterior descending (LAD) coronary artery [facility]) Active Problems:   Cocaine abuse (HCC)   Tobacco abuse   Hyperlipidemia   CAD (coronary artery disease)   Allergies No Known Allergies  Diagnostic Studies/Procedures    Cath 03/03/2018  Prox LAD to Mid LAD lesion is 30% stenosed.  Mid RCA lesion is 100% stenosed.  Prox LAD lesion is 95% stenosed.  A drug-eluting stent was successfully placed using a STENT SIERRA 3.50 X 18 MM.  Post intervention, there is a 0% residual stenosis.  Post intervention, there is a 0% residual stenosis.  Ost 1st Diag lesion is 75% stenosed.  The left ventricular ejection fraction is 25-35% by visual estimate.  LV end diastolic pressure is moderately elevated.  There is severe left ventricular systolic dysfunction.   1.  Acute inferolateral STEMI likely secondary to critical stenosis in the proximal LAD which collateralizes a large dominant RCA 2.  Chronic total occlusion of the RCA within the previously implanted stent in the mid vessel 3.  Widely patent left main and left circumflex 4.  Severe segmental LV systolic dysfunction with LVEF estimated at 30% 5.  Successful PCI of the proximal LAD using a 3.5 x 18 mm Xience Sierra DES postdilated to high pressure with a 3.75 mm noncompliant balloon and overlapping the previously implanted bare-metal stent in the mid LAD  Recommend uninterrupted dual antiplatelet therapy with Aspirin 81mg  daily and Ticagrelor 90mg  twice daily for a minimum of 12 months (ACS - Class I recommendation).   **Pt will need social work and case  manager involvement to help with medication and social service resources for treatment of his drug addiction** _____________  Echo 03/04/2018 LV EF: 25% -   30% Study Conclusions  - Left ventricle: The cavity size was moderately dilated. Wall   thickness was increased in a pattern of moderate LVH. Systolic   function was severely reduced. The estimated ejection fraction   was in the range of 25% to 30%. Diffuse hypokinesis. There is   akinesis of the anteroseptal and inferoseptal myocardium.   Features are consistent with a pseudonormal left ventricular   filling pattern, with concomitant abnormal relaxation and   increased filling pressure (grade 2 diastolic dysfunction). - Mitral valve: There was moderate regurgitation. - Tricuspid valve: There was trivial regurgitation.   History of Present Illness     Brian Ford is a 52 yo male with PMH of CAD s/p stent to RCA ('12), BMS to LAD ('15), HTN, HL, Tobacco use, Cocaine use and depression. He was last seen in 2015 when he presented with CP and underwent cath with BMS placed to the mLAD by Dr. Herbie Baltimore. Noted issues with compliance at that time. He was sent home on DAPT with ASA/plavix. Did not follow up in the office as scheduled.   Reports this morning he developed crushing chest pain about 2 hours prior to arriving to the ED. States he did $20 rock of cocaine today, then developed chest pain. [facility] called Code STEMI on scene. EKG showed acute ST elevation in inferior leads with reciprocal depression in aVL. He took 5 ASA prior to EMS arrival.  Refused nitro with EMS. Reported to be very combative on scene.   On arrival to the ED he was agitated and yelling at staff. Stating "I'm having a heart attack". EKG on arrival showed continued ST elevation in inferior leads. He was given 4000 heparin, and 2mg  ativan. He was evaluated by Dr. Excell Seltzer and brought to the cath lab for emergent cardiac cath.      Hospital Course       Patient underwent cardiac catheterization on 03/03/2018 which revealed a 95% proximal LAD lesion treated with a 3.5 x 18 mm [place] DES, there was a 75% ostial D1 residual, EF was 25 to 35% by visual estimate.  Postprocedure, patient was placed on aspirin and Brilinta.  He was also started on Lipitor 80 mg daily.  Echocardiogram obtained on 03/04/2018 also revealed EF 25 to 30%.  On 03/04/2018, patient has made multiple attempts to leave AMA.  According to the nurse, he was found on the side of the street smoking where he was eventually escorted back to the cardiac ICU.  He also disconnected his IV in the telemetry multiple times without permission in order to smoke while in the ICU.  Dr. Tresa Endo spent over an hour in the morning of 7/31 to try to convince him to stay given the severity of his coronary artery disease.  I received a page around 5:30 PM on 7/31 as per the patient decided to leave AMA again.  He states that he needs to take care of his mother and that he wants to smoke.  Both the nursing staff and me had extensive conversation with the patient.  He is aware that leaving the hospital AGAINST MEDICAL ADVICE this early after a major heart attack can have severe complications including recurrent heart attack, stroke, even loss of life.  He is adamant he will leave AMA despite knowing the risk.  I talked with Dr. Tresa Endo was on call at the time, as a courtesy, I have printed out all of his medications for him to take with him.  This include aspirin, Lipitor, low-dose carvedilol, nitroglycerin, 30-day prescription of Brilinta to go with a coupon card and also a separate one year Brilinta prescription.  I asked the nurse to make sure he has at least a 30-day free coupon with the Brilinta prescription.  Unfortunately, since case manager has not had full time to discuss with the patient with regard to medication assistance after discharge, at this time I am unable to confirm his ability to obtain the medication as  he is eager to walk out of the ICU and has already disconnected himself off the monitor.  I informed him that it is paramount for him to obtain the medications as without the medication it is very likely his stent can shut down..  _____________  Discharge Vitals Blood pressure 121/83, pulse 79, temperature 98 F (36.7 C), temperature source Oral, resp. rate 18, height 5\' 7"  (1.702 m), weight 164 lb 10.9 oz (74.7 kg), SpO2 99 %.  Filed Weights   03/03/18 1432 03/03/18 1545  Weight: 165 lb (74.8 kg) 164 lb 10.9 oz (74.7 kg)    Labs & Radiologic Studies    CBC No results for input(s): WBC, NEUTROABS, HGB, HCT, MCV, PLT in the last 72 hours. Basic Metabolic Panel No results for input(s): NA, K, CL, CO2, GLUCOSE, BUN, CREATININE, CALCIUM, MG, PHOS in the last 72 hours. Liver Function Tests No results for input(s): AST, ALT, ALKPHOS, BILITOT, PROT, ALBUMIN in the last  72 hours. No results for input(s): LIPASE, AMYLASE in the last 72 hours. Cardiac Enzymes No results for input(s): CKTOTAL, CKMB, CKMBINDEX, TROPONINI in the last 72 hours. BNP Invalid input(s): POCBNP D-Dimer No results for input(s): DDIMER in the last 72 hours. Hemoglobin A1C No results for input(s): HGBA1C in the last 72 hours. Fasting Lipid Panel No results for input(s): CHOL, HDL, LDLCALC, TRIG, CHOLHDL, LDLDIRECT in the last 72 hours. Thyroid Function Tests No results for input(s): TSH, T4TOTAL, T3FREE, THYROIDAB in the last 72 hours.  Invalid input(s): FREET3 _____________  No results found. Disposition   Patient left the hospital AGAINST MEDICAL ADVICE despite multiple health care staff trying to convince him otherwise  Follow-up Plans & Appointments    Follow-up Information    [facility] Call.   Why:  Please call and request post hospital discharge follow up appt Contact information: [address] [place] [address] [phone]       [facility] Follow up.   Why:  Another option to establish Primary Care Doctor  Contact information: [address] [place] [address] [phone]         Discharge Instructions    Amb Referral to Cardiac Rehabilitation   Complete by:  As directed    Diagnosis:   Coronary Stents PTCA STEMI        Discharge Medications   Allergies as of 03/04/2018   No Known Allergies     Medication List    STOP taking these medications   omeprazole 40 MG capsule Commonly known as:  PRILOSEC   simvastatin 20 MG tablet Commonly known as:  ZOCOR     TAKE these medications   aspirin EC 81 MG tablet Take 1 tablet (81 mg total) by mouth daily.   atorvastatin 80 MG tablet Commonly known as:  LIPITOR Take 1 tablet (80 mg total) by mouth daily at 6 PM.   carvedilol 3.125 MG tablet Commonly known as:  COREG Take 1 tablet (3.125 mg total) by mouth 2 (two) times daily.   nitroGLYCERIN 0.4 MG SL tablet Commonly known as:  NITROSTAT Place 1 tablet (0.4 mg total) under the tongue every 5 (five) minutes x 3 doses as needed for chest pain.   ticagrelor 90 MG Tabs tablet Commonly known as:  BRILINTA Take 1 tablet (90 mg total) by mouth 2 (two) times daily.        Acute coronary syndrome (MI, NSTEMI, STEMI, etc) this admission?: Yes.     AHA/ACC Clinical Performance & Quality Measures: 1. Aspirin prescribed? - Yes 2. ADP Receptor Inhibitor (Plavix/Clopidogrel, Brilinta/Ticagrelor or Effient/Prasugrel) prescribed (includes medically managed patients)? - Yes 3. Beta Blocker prescribed? - Yes 4. High Intensity Statin (Lipitor 40-80mg  or Crestor 20-40mg ) prescribed? - Yes 5. EF assessed during THIS hospitalization? - Yes 6. For EF <40%, was ACEI/ARB prescribed? - No - Reason:  Patient left against medical advice prior to meds titration 7. For EF <40%, Aldosterone Antagonist (Spironolactone or Eplerenone) prescribed? - No - Reason:  patient left AMA prior to  meds titration 8. Cardiac Rehab Phase II ordered (Included Medically managed Patients)? - No - patient left against medical advance     Outstanding Labs/Studies   N/A  Duration of Discharge Encounter   Greater than 30 minutes including physician time.  Ramond Dial, PA 03/09/2018, 11:37 AM

## 2018-03-11 NOTE — Progress Notes (Signed)
Cardiology Office Note    Date:  03/12/2018   ID:  [name], DOB April 15, 1966, MRN [id number]  PCP:  Patient, No Pcp Per  Cardiologist: Nona Dell, MD    Chief Complaint  Patient presents with  . Hospitalization Follow-up    s/p STEMI - left AMA    History of Present Illness:    Brian Ford is a 52 y.o. male with past medical history of CAD (status post stenting of the RCA in 2012 and BMS to LAD in 2015), HTN, HLD, cocaine use, and tobacco use who presents to the office today for hospital follow-up.  He was recently admitted to [facility] on 03/03/2018 for evaluation of chest discomfort which developed after using cocaine earlier in the day. His initial EKG showed ST elevation along the inferior leads and code STEMI was activated. He was taken for an emergent cardiac catheterization which showed 95% proximal LAD stenosis and this was treated with placement of a DES. There was a 75% ostial D1 residual stenosis and CTO of the RCA. EF was reduced to 30% by catheterization and echocardiogram showed his EF was at 25 to 30% with diffuse hypokinesis and akinesis of the anterior septal and inferior septal myocardium. His hospital course was complicated as he threatened to leave AMA several times and was found outside of the cardiac ICU multiple times along the street where he was smoking. Several providers and nursing staff talked to the patient and advised against leaving, but he did leave AMA on 03/04/2018.  He was provided with printed Rx's for ASA 81mg  daily, Atorvastatin 80mg  daily, Coreg 3.125mg  BID, and Brilinta 90mg  daily.   In talking with the patient today, he reports that he has overall done well since his recent hospitalization. He does describe an episode of burning discomfort along his chest which is occurring for 2 to 3 days after leaving the hospital but says this has resolved. Has baseline dyspnea on exertion but denies any acute changes in this. No recent orthopnea, PND,  lower extremity edema, or palpitations.  He continues to smoke approximately 0.75 ppd but says he is trying to quit. He says that he has not used cocaine since his recent admission as he was "afraid he was going to die" at that time. He reports good compliance with ASA and Brilinta but did not fill the prescriptions for Atorvastatin or Carvedilol.   Past Medical History:  Diagnosis Date  . Anxiety   . CAD (coronary artery disease) 2012   a. PCI of 100% RCA (2012) b. s/p BMS to LAD c. 02/2018: Anterior STEMI with DES to LAD; CTO of RCA with collaterals present.   . Crack cocaine use   . Depression   . GERD (gastroesophageal reflux disease)   . Heart attack (HCC)    x2  . Hyperlipidemia   . Hypertension   . Normocytic anemia   . ST elevation myocardial infarction (STEMI) of inferior wall (HCC) 2012  . Tobacco abuse     Past Surgical History:  Procedure Laterality Date  . CORONARY ANGIOPLASTY    . CORONARY/GRAFT ACUTE MI REVASCULARIZATION N/A 03/03/2018   Procedure: Coronary/Graft Acute MI Revascularization;  Surgeon: Tonny Bollman, MD;  Location: [facility] INVASIVE CV LAB;  Service: Cardiovascular;  Laterality: N/A;  . LEFT HEART CATH AND CORONARY ANGIOGRAPHY N/A 03/03/2018   Procedure: LEFT HEART CATH AND CORONARY ANGIOGRAPHY;  Surgeon: Tonny Bollman, MD;  Location: [facility] INVASIVE CV LAB;  Service: Cardiovascular;  Laterality: N/A;  .  LEFT HEART CATHETERIZATION WITH CORONARY ANGIOGRAM N/A 05/19/2014   Procedure: LEFT HEART CATHETERIZATION WITH CORONARY ANGIOGRAM;  Surgeon: Marykay Lex, MD;  Location: [facility] CATH LAB;  Service: Cardiovascular;  Laterality: N/A;  . NOSE SURGERY    . PERCUTANEOUS CORONARY STENT INTERVENTION (PCI-S)  05/19/2014   Procedure: PERCUTANEOUS CORONARY STENT INTERVENTION (PCI-S);  Surgeon: Marykay Lex, MD;  Location: [facility] CATH LAB;  Service: Cardiovascular;;  BMS to Mid LAD    Current Medications: Outpatient Medications Prior to Visit  Medication Sig Dispense  Refill  . aspirin EC 81 MG tablet Take 1 tablet (81 mg total) by mouth daily. 90 tablet 3  . ticagrelor (BRILINTA) 90 MG TABS tablet Take 1 tablet (90 mg total) by mouth 2 (two) times daily. 60 tablet 11  . atorvastatin (LIPITOR) 80 MG tablet Take 1 tablet (80 mg total) by mouth daily at 6 PM. (Patient not taking: Reported on 03/12/2018) 90 tablet 3  . carvedilol (COREG) 3.125 MG tablet Take 1 tablet (3.125 mg total) by mouth 2 (two) times daily. (Patient not taking: Reported on 03/12/2018) 60 tablet 11  . nitroGLYCERIN (NITROSTAT) 0.4 MG SL tablet Place 1 tablet (0.4 mg total) under the tongue every 5 (five) minutes x 3 doses as needed for chest pain. (Patient not taking: Reported on 03/12/2018) 25 tablet 3   No facility-administered medications prior to visit.      Allergies:   Patient has no known allergies.   Social History   Socioeconomic History  . Marital status: Single    Spouse name: Not on file  . Number of children: Not on file  . Years of education: Not on file  . Highest education level: Not on file  Occupational History  . Occupation: unemployed  Social Needs  . Financial resource strain: Not on file  . Food insecurity:    Worry: Not on file    Inability: Not on file  . Transportation needs:    Medical: Not on file    Non-medical: Not on file  Tobacco Use  . Smoking status: Current Every Day Smoker    Packs/day: 1.00    Years: 35.00    Pack years: 35.00    Types: Cigarettes  . Smokeless tobacco: Never Used  Substance and Sexual Activity  . Alcohol use: No  . Drug use: Yes    Types: "Crack" cocaine, Oxycodone, Benzodiazepines, Cocaine, Marijuana    Comment: ?- pt refused to discuss  . Sexual activity: Not on file  Lifestyle  . Physical activity:    Days per week: Not on file    Minutes per session: Not on file  . Stress: Not on file  Relationships  . Social connections:    Talks on phone: Not on file    Gets together: Not on file    Attends religious  service: Not on file    Active member of club or organization: Not on file    Attends meetings of clubs or organizations: Not on file    Relationship status: Not on file  Other Topics Concern  . Not on file  Social History Narrative  . Not on file     Family History:  The patient's family history includes Diabetes in his brother and sister; Heart disease in his mother; Hypertension in his mother; Stroke in his mother.   Review of Systems:   Please see the history of present illness.     General:  No chills, fever, night sweats or weight changes.  Cardiovascular:  No edema, orthopnea, palpitations, paroxysmal nocturnal dyspnea. Positive for chest pain (improved) and dyspnea on exertion.  Dermatological: No rash, lesions/masses Respiratory: No cough, dyspnea Urologic: No hematuria, dysuria Abdominal:   No nausea, vomiting, diarrhea, bright red blood per rectum, melena, or hematemesis Neurologic:  No visual changes, wkns, changes in mental status. All other systems reviewed and are otherwise negative except as noted above.   Physical Exam:    VS:  BP 124/80   Pulse 77   Ht 5\' 8"  (1.727 m)   Wt 167 lb (75.8 kg)   SpO2 98%   BMI 25.39 kg/m    General: Well developed, well nourished Caucasian male appearing in no acute distress. Head: Normocephalic, atraumatic, sclera non-icteric, no xanthomas, nares are without discharge.  Neck: No carotid bruits. JVD not elevated.  Lungs: Respirations regular and unlabored, without wheezes or rales.  Heart: Regular rate and rhythm. No S3 or S4.  No murmur, no rubs, or gallops appreciated. Abdomen: Soft, non-tender, non-distended with normoactive bowel sounds. No hepatomegaly. No rebound/guarding. No obvious abdominal masses. Msk:  Strength and tone appear normal for age. No joint deformities or effusions. Extremities: No clubbing or cyanosis. No edema.  Distal pedal pulses are 2+ bilaterally. Radial site stable without ecchymosis or evidence of a  hematoma.  Neuro: Alert and oriented X 3. Moves all extremities spontaneously. No focal deficits noted. Psych:  Responds to questions appropriately with a normal affect. Skin: No rashes or lesions noted  Wt Readings from Last 3 Encounters:  03/12/18 167 lb (75.8 kg)  03/03/18 164 lb 10.9 oz (74.7 kg)  09/01/17 169 lb 8 oz (76.9 kg)     Studies/Labs Reviewed:   EKG:  EKG is ordered today.  The ekg ordered today demonstrates normal sinus rhythm, heart rate 85, with inferior Q waves and T wave inversion along the the inferior and lateral leads.  Recent Labs: 03/03/2018: ALT 23 03/04/2018: BUN 17; Creatinine, Ser 1.24; Hemoglobin 14.1; Platelets 229; Potassium 3.7; Sodium 138   Lipid Panel    Component Value Date/Time   CHOL 182 03/04/2018 0353   TRIG 144 03/04/2018 0353   HDL 36 (L) 03/04/2018 0353   CHOLHDL 5.1 03/04/2018 0353   VLDL 29 03/04/2018 0353   LDLCALC 117 (H) 03/04/2018 0353    Additional studies/ records that were reviewed today include:   Cardiac Catheterization: 03/03/2018  Prox LAD to Mid LAD lesion is 30% stenosed.  Mid RCA lesion is 100% stenosed.  Prox LAD lesion is 95% stenosed.  A drug-eluting stent was successfully placed using a STENT SIERRA 3.50 X 18 MM.  Post intervention, there is a 0% residual stenosis.  Post intervention, there is a 0% residual stenosis.  Ost 1st Diag lesion is 75% stenosed.  The left ventricular ejection fraction is 25-35% by visual estimate.  LV end diastolic pressure is moderately elevated.  There is severe left ventricular systolic dysfunction.   1.  Acute inferolateral STEMI likely secondary to critical stenosis in the proximal LAD which collateralizes a large dominant RCA 2.  Chronic total occlusion of the RCA within the previously implanted stent in the mid vessel 3.  Widely patent left main and left circumflex 4.  Severe segmental LV systolic dysfunction with LVEF estimated at 30% 5.  Successful PCI of the  proximal LAD using a 3.5 x 18 mm Xience Sierra DES postdilated to high pressure with a 3.75 mm noncompliant balloon and overlapping the previously implanted bare-metal stent in the mid LAD  Recommend uninterrupted dual antiplatelet therapy with Aspirin 81mg  daily and Ticagrelor 90mg  twice daily for a minimum of 12 months (ACS - Class I recommendation).   **Pt will need social work and case manager involvement to help with medication and social service resources for treatment of his drug addiction**  Echocardiogram: 03/04/2018 Study Conclusions  - Left ventricle: The cavity size was moderately dilated. Wall   thickness was increased in a pattern of moderate LVH. Systolic   function was severely reduced. The estimated ejection fraction   was in the range of 25% to 30%. Diffuse hypokinesis. There is   akinesis of the anteroseptal and inferoseptal myocardium.   Features are consistent with a pseudonormal left ventricular   filling pattern, with concomitant abnormal relaxation and   increased filling pressure (grade 2 diastolic dysfunction). - Mitral valve: There was moderate regurgitation. - Tricuspid valve: There was trivial regurgitation.   Assessment:    1. ST elevation myocardial infarction (STEMI), subsequent episode of care (HCC)   2. Coronary artery disease involving native coronary artery of native heart without angina pectoris   3. Ischemic cardiomyopathy   4. Hyperlipidemia LDL goal <70   5. Cocaine use   6. Tobacco use      Plan:   In order of problems listed above:  1. Subsequent Episode of Care for STEMI/ CAD - the patient is s/p stenting of the RCA in 2012 and BMS to LAD in 2015. Recently admitted for an anterior STEMI with cath showing 95% proximal LAD stenosis and this was treated with placement of a DES. There was a 75% ostial D1 residual stenosis and CTO of the RCA with collaterals present as noted above. - He reports having episodes of a burning chest  discomfort which was constant for several days following recent discharge but says this has now resolved. - The importance of compliance with his medications was strongly reinforced at today's visit along with stopping Cocaine use. He says that he has not missed any doses of ASA or Brilinta. Form was completed today for him to be able to obtain medications from the [facility]. If unable to afford Brilinta, would switch to Plavix 75 mg daily. Samples of Brilinta were provided at today's visit. We discussed the importance of starting on statin and beta-blocker therapy as he was discharged on but has not yet started.   2. Ischemic Cardiomyopathy - EF was reduced to 25 to 30% during his recent admission. He denies any recent orthopnea, PND, or lower extremity edema and appears euvolemic by examination today. He has not yet started Carvedilol 3.125 mg twice daily and will plan to initiate this currently. Pending HR and BP response, can consider the addition of an ARB at the time of his follow-up. - He will need a repeat echocardiogram following 3 months of medical therapy. He was not discharged on a LifeVest due to leaving AMA, medical noncompliance, and his cocaine use.  All of these things would likely influence candidacy for an ICD if EF remains reduced.  3. HLD - FLP during recent admission showed total cholesterol 182, triglycerides 144, HDL 36, and LDL 117. Goal LDL is less than 70 in the setting of known CAD. He was prescribed Atorvastatin 80 mg daily but has not filled this yet. Was advised to start his statin medication today.  Will need repeat FLP and LFT's in 6 to 8 weeks.  4. Cocaine Use -The patient reports using crack cocaine the morning of his recent STEMI.  He says that he has not used since hospital discharge. He was placed on beta-blocker therapy at that time but has not filled this prescription yet.  Will plan to start Coreg as outlined above but if he does have  recurrent use, would need to discontinue beta-blocker therapy. We reviewed the risk of concurrent cocaine use with beta-blocker therapy during today's visit.  5. Tobacco Use - He was previously smoking over a pack per day but has reduced this to 0.75 ppd. Continued reduction with eventual cessation was advised.   Medication Adjustments/Labs and Tests Ordered: Current medicines are reviewed at length with the patient today.  Concerns regarding medicines are outlined above.  Medication changes, Labs and Tests ordered today are listed in the Patient Instructions below. Patient Instructions  Medication Instructions:  Your physician recommends that you continue on your current medications as directed. Please refer to the Current Medication list given to you today.  Labwork: NONE   Testing/Procedures: NONE   Follow-Up: Your physician recommends that you schedule a follow-up appointment in: 6 Weeks with Dr. Diona Browner.   Any Other Special Instructions Will Be Listed Below (If Applicable).  If you need a refill on your cardiac medications before your next appointment, please call your pharmacy.  Thank you for choosing [facility]!    Signed, Brian Lennox, PA-C  03/12/2018 4:52 PM    [facility] [address] [place], [place] [address] Phone: [phone]

## 2018-03-12 ENCOUNTER — Ambulatory Visit (INDEPENDENT_AMBULATORY_CARE_PROVIDER_SITE_OTHER): Payer: Self-pay | Admitting: Student

## 2018-03-12 ENCOUNTER — Encounter: Payer: Self-pay | Admitting: Student

## 2018-03-12 VITALS — BP 124/80 | HR 77 | Ht 68.0 in | Wt 167.0 lb

## 2018-03-12 DIAGNOSIS — E785 Hyperlipidemia, unspecified: Secondary | ICD-10-CM

## 2018-03-12 DIAGNOSIS — F1490 Cocaine use, unspecified, uncomplicated: Secondary | ICD-10-CM

## 2018-03-12 DIAGNOSIS — Z720 Tobacco use: Secondary | ICD-10-CM

## 2018-03-12 DIAGNOSIS — I213 ST elevation (STEMI) myocardial infarction of unspecified site: Secondary | ICD-10-CM

## 2018-03-12 DIAGNOSIS — I255 Ischemic cardiomyopathy: Secondary | ICD-10-CM

## 2018-03-12 DIAGNOSIS — I2510 Atherosclerotic heart disease of native coronary artery without angina pectoris: Secondary | ICD-10-CM

## 2018-03-12 MED ORDER — TICAGRELOR 90 MG PO TABS: 90.0000 mg | ORAL_TABLET | Freq: Two times a day (BID) | ORAL | 11 refills | Status: AC

## 2018-03-12 MED ORDER — CARVEDILOL 3.125 MG PO TABS: 3.1250 mg | ORAL_TABLET | Freq: Two times a day (BID) | ORAL | 11 refills | Status: DC

## 2018-03-12 MED ORDER — NITROGLYCERIN 0.4 MG SL SUBL: 0.4000 mg | SUBLINGUAL_TABLET | SUBLINGUAL | 3 refills | Status: AC | PRN

## 2018-03-12 MED ORDER — ATORVASTATIN CALCIUM 80 MG PO TABS: 80.0000 mg | ORAL_TABLET | Freq: Every day | ORAL | 3 refills | Status: DC

## 2018-03-12 NOTE — Patient Instructions (Signed)
Medication Instructions:  Your physician recommends that you continue on your current medications as directed. Please refer to the Current Medication list given to you today.   Labwork: NONE   Testing/Procedures: NONE   Follow-Up: Your physician recommends that you schedule a follow-up appointment in: 6 Weeks with Dr. Diona Browner.    Any Other Special Instructions Will Be Listed Below (If Applicable).     If you need a refill on your cardiac medications before your next appointment, please call your pharmacy.  Thank you for choosing [facility]!

## 2018-03-13 ENCOUNTER — Ambulatory Visit: Payer: Self-pay | Admitting: Student

## 2018-04-14 ENCOUNTER — Telehealth: Payer: Self-pay | Admitting: Cardiology

## 2018-04-14 ENCOUNTER — Telehealth: Payer: Self-pay

## 2018-04-14 NOTE — Telephone Encounter (Signed)
Pt is needing samples of Brilinta, he has enough till Wednesday. He has not heard anything about his assistance

## 2018-04-14 NOTE — Telephone Encounter (Signed)
I spoke with pharmacist at [facility] [phone] states she has samples in the drawer awaiting him.he is going to go there now

## 2018-04-14 NOTE — Telephone Encounter (Signed)
Brilinta samples 3 bottles #8 each, lot [id number], exp 11/2020

## 2018-04-21 ENCOUNTER — Ambulatory Visit: Payer: Self-pay | Admitting: Cardiology

## 2018-04-30 NOTE — Progress Notes (Deleted)
Cardiology Office Note  Date: 04/30/2018   ID: [name], DOB [birth year], MRN [id number]  PCP: Patient, No Pcp Per  Evaluating Cardiologist: Nona Dell, MD   No chief complaint on file.   History of Present Illness: Brian Ford is a 52 y.o. male assigned to me as primary cardiologist, although based on record review it does not appear that I have seen him.  I reviewed extensive records and updated his chart.  He was recently seen by Ms. Strader PA-C in August following hospital stay at [facility] in July with chest pain associated with cocaine use.  Cardiac catheterization was performed with concern for inferior STEMI and patient was treated with DES to a 95% proximal LAD stenosis.  Residual disease included 75% first diagonal and chronic total occlusion of the RCA.  Echocardiogram revealed LVEF 25 to 30%.  Patient ultimately left the hospital AMA.  Past Medical History:  Diagnosis Date  . Anxiety   . CAD (coronary artery disease) 2012   a. PCI of 100% RCA (2012) b. s/p BMS to LAD c. 02/2018: Anterior STEMI with DES to LAD; CTO of RCA with collaterals present.   . Crack cocaine use   . Depression   . GERD (gastroesophageal reflux disease)   . Heart attack (HCC)    x2  . Hyperlipidemia   . Hypertension   . Normocytic anemia   . ST elevation myocardial infarction (STEMI) of inferior wall (HCC) 2012  . Tobacco abuse     Past Surgical History:  Procedure Laterality Date  . CORONARY ANGIOPLASTY    . CORONARY/GRAFT ACUTE MI REVASCULARIZATION N/A 03/03/2018   Procedure: Coronary/Graft Acute MI Revascularization;  Surgeon: Tonny Bollman, MD;  Location: [facility] INVASIVE CV LAB;  Service: Cardiovascular;  Laterality: N/A;  . LEFT HEART CATH AND CORONARY ANGIOGRAPHY N/A 03/03/2018   Procedure: LEFT HEART CATH AND CORONARY ANGIOGRAPHY;  Surgeon: Tonny Bollman, MD;  Location: [facility] INVASIVE CV LAB;  Service: Cardiovascular;  Laterality: N/A;  . LEFT HEART CATHETERIZATION WITH  CORONARY ANGIOGRAM N/A 05/19/2014   Procedure: LEFT HEART CATHETERIZATION WITH CORONARY ANGIOGRAM;  Surgeon: Marykay Lex, MD;  Location: [facility] CATH LAB;  Service: Cardiovascular;  Laterality: N/A;  . NOSE SURGERY    . PERCUTANEOUS CORONARY STENT INTERVENTION (PCI-S)  05/19/2014   Procedure: PERCUTANEOUS CORONARY STENT INTERVENTION (PCI-S);  Surgeon: Marykay Lex, MD;  Location: [facility] CATH LAB;  Service: Cardiovascular;;  BMS to Mid LAD    Current Outpatient Medications  Medication Sig Dispense Refill  . aspirin EC 81 MG tablet Take 1 tablet (81 mg total) by mouth daily. 90 tablet 3  . atorvastatin (LIPITOR) 80 MG tablet Take 1 tablet (80 mg total) by mouth daily at 6 PM. 90 tablet 3  . carvedilol (COREG) 3.125 MG tablet Take 1 tablet (3.125 mg total) by mouth 2 (two) times daily. 60 tablet 11  . nitroGLYCERIN (NITROSTAT) 0.4 MG SL tablet Place 1 tablet (0.4 mg total) under the tongue every 5 (five) minutes x 3 doses as needed for chest pain. 25 tablet 3  . ticagrelor (BRILINTA) 90 MG TABS tablet Take 1 tablet (90 mg total) by mouth 2 (two) times daily. 60 tablet 11   No current facility-administered medications for this visit.    Allergies:  Patient has no known allergies.   Social History: The patient  reports that he has been smoking cigarettes. He has a 35.00 pack-year smoking history. He has never used smokeless tobacco. He reports that he  has current or past drug history. Drugs: "Crack" cocaine, Oxycodone, Benzodiazepines, Cocaine, and Marijuana. He reports that he does not drink alcohol.   Family History: The patient's family history includes Diabetes in his brother and sister; Heart disease in his mother; Hypertension in his mother; Stroke in his mother.   ROS:  Please see the history of present illness. Otherwise, complete review of systems is positive for {NONE DEFAULTED:18576::"none"}.  All other systems are reviewed and negative.   Physical Exam: VS:  There were no vitals taken  for this visit., BMI There is no height or weight on file to calculate BMI.  Wt Readings from Last 3 Encounters:  03/12/18 167 lb (75.8 kg)  03/03/18 164 lb 10.9 oz (74.7 kg)  09/01/17 169 lb 8 oz (76.9 kg)    General: Patient appears comfortable at rest. HEENT: Conjunctiva and lids normal, oropharynx clear with moist mucosa. Neck: Supple, no elevated JVP or carotid bruits, no thyromegaly. Lungs: Clear to auscultation, nonlabored breathing at rest. Cardiac: Regular rate and rhythm, no S3 or significant systolic murmur, no pericardial rub. Abdomen: Soft, nontender, no hepatomegaly, bowel sounds present, no guarding or rebound. Extremities: No pitting edema, distal pulses 2+. Skin: Warm and dry. Musculoskeletal: No kyphosis. Neuropsychiatric: Alert and oriented x3, affect grossly appropriate.  ECG: I personally reviewed the tracing from 03/12/2018 which showed sinus rhythm with IVCD of left bundle branch block type and repolarization abnormalities.  Recent Labwork: 03/03/2018: ALT 23; AST 26 03/04/2018: BUN 17; Creatinine, Ser 1.24; Hemoglobin 14.1; Platelets 229; Potassium 3.7; Sodium 138     Component Value Date/Time   CHOL 182 03/04/2018 0353   TRIG 144 03/04/2018 0353   HDL 36 (L) 03/04/2018 0353   CHOLHDL 5.1 03/04/2018 0353   VLDL 29 03/04/2018 0353   LDLCALC 117 (H) 03/04/2018 0353    Other Studies Reviewed Today:  Echocardiogram 03/04/2018: Study Conclusions  - Left ventricle: The cavity size was moderately dilated. Wall   thickness was increased in a pattern of moderate LVH. Systolic   function was severely reduced. The estimated ejection fraction   was in the range of 25% to 30%. Diffuse hypokinesis. There is   akinesis of the anteroseptal and inferoseptal myocardium.   Features are consistent with a pseudonormal left ventricular   filling pattern, with concomitant abnormal relaxation and   increased filling pressure (grade 2 diastolic dysfunction). - Mitral valve:  There was moderate regurgitation. - Tricuspid valve: There was trivial regurgitation.  Cardiac catheterization and PCI 03/03/2018:  Prox LAD to Mid LAD lesion is 30% stenosed.  Mid RCA lesion is 100% stenosed.  Prox LAD lesion is 95% stenosed.  A drug-eluting stent was successfully placed using a STENT SIERRA 3.50 X 18 MM.  Post intervention, there is a 0% residual stenosis.  Post intervention, there is a 0% residual stenosis.  Ost 1st Diag lesion is 75% stenosed.  The left ventricular ejection fraction is 25-35% by visual estimate.  LV end diastolic pressure is moderately elevated.  There is severe left ventricular systolic dysfunction.   1.  Acute inferolateral STEMI likely secondary to critical stenosis in the proximal LAD which collateralizes a large dominant RCA 2.  Chronic total occlusion of the RCA within the previously implanted stent in the mid vessel 3.  Widely patent left main and left circumflex 4.  Severe segmental LV systolic dysfunction with LVEF estimated at 30% 5.  Successful PCI of the proximal LAD using a 3.5 x 18 mm Xience Sierra DES postdilated to high  pressure with a 3.75 mm noncompliant balloon and overlapping the previously implanted bare-metal stent in the mid LAD  Assessment and Plan:   Current medicines were reviewed with the patient today.  No orders of the defined types were placed in this encounter.   Disposition:  Signed, Jonelle Sidle, MD, [facility] 04/30/2018 1:31 PM    [facility] at [facility] [address], [place], [place] [address] Phone: [phone]; Fax: [phone]

## 2018-05-01 ENCOUNTER — Ambulatory Visit: Payer: Self-pay | Admitting: Cardiology

## 2018-06-09 ENCOUNTER — Ambulatory Visit (INDEPENDENT_AMBULATORY_CARE_PROVIDER_SITE_OTHER): Payer: Self-pay | Admitting: Cardiology

## 2018-06-09 ENCOUNTER — Encounter: Payer: Self-pay | Admitting: Cardiology

## 2018-06-09 VITALS — BP 104/76 | HR 94 | Ht 68.0 in | Wt 161.0 lb

## 2018-06-09 DIAGNOSIS — F1910 Other psychoactive substance abuse, uncomplicated: Secondary | ICD-10-CM

## 2018-06-09 DIAGNOSIS — I25119 Atherosclerotic heart disease of native coronary artery with unspecified angina pectoris: Secondary | ICD-10-CM

## 2018-06-09 DIAGNOSIS — I255 Ischemic cardiomyopathy: Secondary | ICD-10-CM

## 2018-06-09 DIAGNOSIS — E782 Mixed hyperlipidemia: Secondary | ICD-10-CM

## 2018-06-09 NOTE — Progress Notes (Signed)
Cardiology Office Note  Date: 06/09/2018   ID: Brian Ford, DOB 1966/02/10, MRN [id number]  PCP: Patient, No Pcp Per  Primary Cardiologist: Nona Dell, MD   Chief Complaint  Patient presents with  . Coronary Artery Disease  . Cardiomyopathy    History of Present Illness: Brian Ford is a 52 y.o. male presenting to the office for follow-up, last seen in August by Ms. Strader PA-C.  I am listed as his primary cardiologist, although based on record review it does not appear that I have actually seen him at least since 2012.  I reviewed extensive records including his hospital stay in July.  He presents today stating that he has been taking his medications, has been getting them with assistance from the health department, he gets shipments mailed.  He has not yet received his follow-up shipment of Brilinta, we are providing him office samples today.  He does not report any recent nitroglycerin use, sometimes feels a sharp sensation in his chest, particularly when he lays on one side.  He states that he is in the process of applying for Medicaid.  I reviewed his medications which are outlined below.  He has not had a follow-up echocardiogram or lipid panel since the events of July.  We discussed this today.  I also asked him about illicit substance abuse, and he denied any further drug use at this point.  Past Medical History:  Diagnosis Date  . Anxiety   . CAD (coronary artery disease) 2012   a. PCI of 100% RCA (2012) b. s/p BMS to LAD c. 02/2018: Anterior STEMI with DES to LAD; CTO of RCA with collaterals present.   . Crack cocaine use   . Depression   . GERD (gastroesophageal reflux disease)   . Hyperlipidemia   . Hypertension   . Normocytic anemia   . ST elevation myocardial infarction (STEMI) of inferior wall [facility])     Past Surgical History:  Procedure Laterality Date  . CORONARY ANGIOPLASTY    . CORONARY/GRAFT ACUTE MI REVASCULARIZATION N/A 03/03/2018   Procedure: Coronary/Graft Acute MI Revascularization;  Surgeon: Tonny Bollman, MD;  Location: [facility] INVASIVE CV LAB;  Service: Cardiovascular;  Laterality: N/A;  . LEFT HEART CATH AND CORONARY ANGIOGRAPHY N/A 03/03/2018   Procedure: LEFT HEART CATH AND CORONARY ANGIOGRAPHY;  Surgeon: Tonny Bollman, MD;  Location: [facility] INVASIVE CV LAB;  Service: Cardiovascular;  Laterality: N/A;  . LEFT HEART CATHETERIZATION WITH CORONARY ANGIOGRAM N/A 05/19/2014   Procedure: LEFT HEART CATHETERIZATION WITH CORONARY ANGIOGRAM;  Surgeon: Marykay Lex, MD;  Location: [facility] CATH LAB;  Service: Cardiovascular;  Laterality: N/A;  . NOSE SURGERY    . PERCUTANEOUS CORONARY STENT INTERVENTION (PCI-S)  05/19/2014   Procedure: PERCUTANEOUS CORONARY STENT INTERVENTION (PCI-S);  Surgeon: Marykay Lex, MD;  Location: [facility] CATH LAB;  Service: Cardiovascular;;  BMS to Mid LAD    Current Outpatient Medications  Medication Sig Dispense Refill  . aspirin EC 81 MG tablet Take 1 tablet (81 mg total) by mouth daily. 90 tablet 3  . atorvastatin (LIPITOR) 80 MG tablet Take 1 tablet (80 mg total) by mouth daily at 6 PM. 90 tablet 3  . carvedilol (COREG) 3.125 MG tablet Take 1 tablet (3.125 mg total) by mouth 2 (two) times daily. 60 tablet 11  . nitroGLYCERIN (NITROSTAT) 0.4 MG SL tablet Place 1 tablet (0.4 mg total) under the tongue every 5 (five) minutes x 3 doses as needed for chest pain. 25 tablet 3  .  ticagrelor (BRILINTA) 90 MG TABS tablet Take 1 tablet (90 mg total) by mouth 2 (two) times daily. 60 tablet 11   No current facility-administered medications for this visit.    Allergies:  Patient has no known allergies.   Social History: The patient  reports that he has been smoking cigarettes. He has a 35.00 pack-year smoking history. He has never used smokeless tobacco. He reports that he has current or past drug history. Drugs: "Crack" cocaine, Oxycodone, Benzodiazepines, Cocaine, and Marijuana. He reports that he does not drink  alcohol.   ROS:  Please see the history of present illness. Otherwise, complete review of systems is positive for none.  All other systems are reviewed and negative.   Physical Exam: VS:  BP 104/76 (BP Location: Left Arm)   Pulse 94   Ht 5\' 8"  (1.727 m)   Wt 161 lb (73 kg)   SpO2 94%   BMI 24.48 kg/m , BMI Body mass index is 24.48 kg/m.  Wt Readings from Last 3 Encounters:  06/09/18 161 lb (73 kg)  03/12/18 167 lb (75.8 kg)  03/03/18 164 lb 10.9 oz (74.7 kg)    General: Patient appears comfortable at rest. HEENT: Conjunctiva and lids normal, oropharynx clear. Neck: Supple, no elevated JVP or carotid bruits, no thyromegaly. Lungs: Clear to auscultation, nonlabored breathing at rest. Cardiac: Regular rate and rhythm, no S3 or significant systolic murmur, no pericardial rub. Abdomen: Soft, nontender, bowel sounds present. Extremities: No pitting edema, distal pulses 2+. Skin: Warm and dry. Musculoskeletal: No kyphosis. Neuropsychiatric: Alert and oriented x3, affect grossly appropriate.  ECG: I personally reviewed the tracing from 03/12/2018 which showed sinus rhythm with IVCD, poor R wave progression, possible old inferior infarct pattern, repolarization abnormalities.  Recent Labwork: 03/03/2018: ALT 23; AST 26 03/04/2018: BUN 17; Creatinine, Ser 1.24; Hemoglobin 14.1; Platelets 229; Potassium 3.7; Sodium 138     Component Value Date/Time   CHOL 182 03/04/2018 0353   TRIG 144 03/04/2018 0353   HDL 36 (L) 03/04/2018 0353   CHOLHDL 5.1 03/04/2018 0353   VLDL 29 03/04/2018 0353   LDLCALC 117 (H) 03/04/2018 0353    Other Studies Reviewed Today:  Echocardiogram 03/04/2018: Study Conclusions  - Left ventricle: The cavity size was moderately dilated. Wall   thickness was increased in a pattern of moderate LVH. Systolic   function was severely reduced. The estimated ejection fraction   was in the range of 25% to 30%. Diffuse hypokinesis. There is   akinesis of the  anteroseptal and inferoseptal myocardium.   Features are consistent with a pseudonormal left ventricular   filling pattern, with concomitant abnormal relaxation and   increased filling pressure (grade 2 diastolic dysfunction). - Mitral valve: There was moderate regurgitation. - Tricuspid valve: There was trivial regurgitation.  Cardiac catheterization and PCI 02/04/2018:  Prox LAD to Mid LAD lesion is 30% stenosed.  Mid RCA lesion is 100% stenosed.  Prox LAD lesion is 95% stenosed.  A drug-eluting stent was successfully placed using a STENT SIERRA 3.50 X 18 MM.  Post intervention, there is a 0% residual stenosis.  Post intervention, there is a 0% residual stenosis.  Ost 1st Diag lesion is 75% stenosed.  The left ventricular ejection fraction is 25-35% by visual estimate.  LV end diastolic pressure is moderately elevated.  There is severe left ventricular systolic dysfunction.   1.  Acute inferolateral STEMI likely secondary to critical stenosis in the proximal LAD which collateralizes a large dominant RCA 2.  Chronic total  occlusion of the RCA within the previously implanted stent in the mid vessel 3.  Widely patent left main and left circumflex 4.  Severe segmental LV systolic dysfunction with LVEF estimated at 30% 5.  Successful PCI of the proximal LAD using a 3.5 x 18 mm Xience Sierra DES postdilated to high pressure with a 3.75 mm noncompliant balloon and overlapping the previously implanted bare-metal stent in the mid LAD  Recommend uninterrupted dual antiplatelet therapy with Aspirin 81mg  daily and Ticagrelor 90mg  twice daily for a minimum of 12 months (ACS - Class I recommendation).   Assessment and Plan:  1.  CAD with history of DES to the LAD in July of this year in the setting of ACS complicated by cocaine use with known CTO of the RCA associated with collaterals.  He had undergone previous mid LAD BMS intervention and his most recent DES overlaps the prior stent.   Plan is to continue dual antiplatelet therapy at this point, we are providing samples for Brilinta, he also gets assistance for this to the health department.  2.  Ischemic cardiomyopathy, LVEF was in the range of 25 to 30% by echocardiogram in July.  Plan is to obtain a follow-up echocardiogram for reassessment, he tells me that he has been taking Coreg.  3.  History of polysubstance abuse, he states that he has been clean since the events of July.  4.  Mixed hyperlipidemia, LDL was 117 in July, follow-up FLP and LFT will be obtained on Lipitor.  Current medicines were reviewed with the patient today.   Orders Placed This Encounter  Procedures  . Lipid Profile  . Hepatic function panel  . ECHOCARDIOGRAM COMPLETE    Disposition: Follow-up in 3 months.  Signed, Jonelle Sidle, MD, [facility] 06/09/2018 2:16 PM    [facility] at [facility] [address] [place], [place] [address] Phone: [phone]; Fax: [phone]

## 2018-06-09 NOTE — Patient Instructions (Signed)
Medication Instructions:  Your physician recommends that you continue on your current medications as directed. Please refer to the Current Medication list given to you today.   Labwork: Fasting lipid  LFT's   Testing/Procedures: Your physician has requested that you have an echocardiogram. Echocardiography is a painless test that uses sound waves to create images of your heart. It provides your doctor with information about the size and shape of your heart and how well your heart's chambers and valves are working. This procedure takes approximately one hour. There are no restrictions for this procedure.    Follow-Up: Your physician recommends that you schedule a follow-up appointment in: 3 months    Any Other Special Instructions Will Be Listed Below (If Applicable).     If you need a refill on your cardiac medications before your next appointment, please call your pharmacy.

## 2018-06-23 ENCOUNTER — Ambulatory Visit (HOSPITAL_COMMUNITY)
Admission: RE | Admit: 2018-06-23 | Discharge: 2018-06-23 | Disposition: A | Discharge status: Home / Self Care | Payer: Self-pay | Source: Ambulatory Visit | Attending: Cardiology | Admitting: Cardiology

## 2018-06-23 ENCOUNTER — Other Ambulatory Visit (HOSPITAL_COMMUNITY)
Admission: RE | Admit: 2018-06-23 | Discharge: 2018-06-23 | Disposition: A | Discharge status: Home / Self Care | Payer: Self-pay | Source: Ambulatory Visit | Attending: Cardiology | Admitting: Cardiology

## 2018-06-23 DIAGNOSIS — I255 Ischemic cardiomyopathy: Secondary | ICD-10-CM | POA: Insufficient documentation

## 2018-06-23 DIAGNOSIS — I25119 Atherosclerotic heart disease of native coronary artery with unspecified angina pectoris: Secondary | ICD-10-CM | POA: Insufficient documentation

## 2018-06-23 LAB — HEPATIC FUNCTION PANEL
ALBUMIN: 4.4 g/dL (ref 3.5–5.0)
ALK PHOS: 85 U/L (ref 38–126)
ALT: 28 U/L (ref 0–44)
AST: 23 U/L (ref 15–41)
Bilirubin, Direct: 0.1 mg/dL (ref 0.0–0.2)
Indirect Bilirubin: 0.7 mg/dL (ref 0.3–0.9)
TOTAL PROTEIN: 7.6 g/dL (ref 6.5–8.1)
Total Bilirubin: 0.8 mg/dL (ref 0.3–1.2)

## 2018-06-23 LAB — LIPID PANEL
CHOL/HDL RATIO: 3.5 ratio
Cholesterol: 138 mg/dL (ref 0–200)
HDL: 40 mg/dL — AB (ref >40)
LDL CALC: 70 mg/dL (ref 0–99)
Triglycerides: 138 mg/dL (ref <150)
VLDL: 28 mg/dL (ref 0–40)

## 2018-06-23 NOTE — Progress Notes (Signed)
*  PRELIMINARY RESULTS* Echocardiogram 2D Echocardiogram has been performed.  Stacey Drain 06/23/2018, 11:41 AM

## 2018-09-10 NOTE — Progress Notes (Signed)
Patient presented for a scheduled follow-up visit today.  He checked in and was taken back to a room by nursing, vital signs obtained as documented in the encounter.  His visit was scheduled for 11:40 in the morning, when I went to his room at 11:38, he was absent.  Nursing tells me that he had been pacing around, door opened, stated that he "did not like doctors" and needed to leave.  He was observed walking out of the office by front office staff and said that he "had a ride."  He did not return for his visit.

## 2018-09-14 ENCOUNTER — Encounter: Payer: Self-pay | Admitting: Cardiology

## 2018-09-14 ENCOUNTER — Ambulatory Visit (INDEPENDENT_AMBULATORY_CARE_PROVIDER_SITE_OTHER): Payer: Self-pay | Admitting: Cardiology

## 2018-09-14 VITALS — BP 120/78 | HR 92 | Ht 68.0 in | Wt 159.0 lb

## 2018-09-14 DIAGNOSIS — I255 Ischemic cardiomyopathy: Secondary | ICD-10-CM

## 2019-05-25 ENCOUNTER — Telehealth: Payer: Self-pay | Admitting: Cardiology

## 2019-05-25 NOTE — Telephone Encounter (Signed)

## 2019-05-27 NOTE — Progress Notes (Deleted)
{Choose 1 Note Type (Telehealth Visit or Telephone Visit):[phone]}   Date:  05/27/2019   ID:  [name], DOB 11-May-1966, MRN [id number]  {Patient Location:[phone]::"Home"} {Provider [phone]::"Home"}  PCP:  Patient, No Pcp Per  Cardiologist:  Nona Dell, MD Electrophysiologist:  None   Evaluation Performed:  {Choose Visit Type:[phone]::"Follow-Up Visit"}  Chief Complaint:  ***  History of Present Illness:    Brian Ford is a 53 y.o. male last seen in November 2019.  He presented for but did not stay to complete an office encounter in February (left suddenly).  He has history of CAD status post DES to the LAD in July 2019 in the setting of ACS complicated by cocaine use and known CTO of the RCA associated with collaterals.  Echocardiogram from November 2019 revealed LVEF 20 to 25% range.  Follow-up study was recommended for reevaluation of LVEF on medical therapy, however he did not complete this.  The patient {does/does not:200015} have symptoms concerning for COVID-19 infection (fever, chills, cough, or new shortness of breath).    Past Medical History:  Diagnosis Date  . Anxiety   . CAD (coronary artery disease) 2012   a. PCI of 100% RCA (2012) b. s/p BMS to LAD c. 02/2018: Anterior STEMI with DES to LAD; CTO of RCA with collaterals present.   . Crack cocaine use   . Depression   . GERD (gastroesophageal reflux disease)   . Hyperlipidemia   . Hypertension   . Normocytic anemia   . ST elevation myocardial infarction (STEMI) of inferior wall [facility])    Past Surgical History:  Procedure Laterality Date  . CORONARY ANGIOPLASTY    . CORONARY/GRAFT ACUTE MI REVASCULARIZATION N/A 03/03/2018   Procedure: Coronary/Graft Acute MI Revascularization;  Surgeon: Tonny Bollman, MD;  Location: [facility] INVASIVE CV LAB;  Service: Cardiovascular;  Laterality: N/A;  . LEFT HEART CATH AND CORONARY ANGIOGRAPHY N/A 03/03/2018   Procedure: LEFT HEART CATH AND  CORONARY ANGIOGRAPHY;  Surgeon: Tonny Bollman, MD;  Location: [facility] INVASIVE CV LAB;  Service: Cardiovascular;  Laterality: N/A;  . LEFT HEART CATHETERIZATION WITH CORONARY ANGIOGRAM N/A 05/19/2014   Procedure: LEFT HEART CATHETERIZATION WITH CORONARY ANGIOGRAM;  Surgeon: Marykay Lex, MD;  Location: [facility] CATH LAB;  Service: Cardiovascular;  Laterality: N/A;  . NOSE SURGERY    . PERCUTANEOUS CORONARY STENT INTERVENTION (PCI-S)  05/19/2014   Procedure: PERCUTANEOUS CORONARY STENT INTERVENTION (PCI-S);  Surgeon: Marykay Lex, MD;  Location: [facility] CATH LAB;  Service: Cardiovascular;;  BMS to Mid LAD     No outpatient medications have been marked as taking for the 05/28/19 encounter (Appointment) with Jonelle Sidle, MD.     Allergies:   Patient has no known allergies.   Social History   Tobacco Use  . Smoking status: Current Every Day Smoker    Packs/day: 1.00    Years: 35.00    Pack years: 35.00    Types: Cigarettes  . Smokeless tobacco: Never Used  Substance Use Topics  . Alcohol use: No  . Drug use: Yes    Types: "Crack" cocaine, Oxycodone, Benzodiazepines, Cocaine, Marijuana    Comment: ?- pt refused to discuss     Family Hx: The patient's family history includes Diabetes in his brother and sister; Heart disease in his mother; Hypertension in his mother; Stroke in his mother.  ROS:   Please see the history of present illness.    *** All other systems reviewed and are negative.   Prior CV studies:  The following studies were reviewed today:  Echocardiogram 06/23/2018: Study Conclusions  - Left ventricle: The cavity size was normal. Wall thickness was   increased in a pattern of moderate LVH. Systolic function was   severely reduced. The estimated ejection fraction was in the   range of 20% to 25%. Diffuse hypokinesis. Doppler parameters are   consistent with abnormal left ventricular relaxation (grade 1   diastolic dysfunction). - Regional wall motion  abnormality: Akinesis of the mid   anteroseptal myocardium. - Aortic valve: There was mild regurgitation. Valve area (VTI):   1.98 cm^2. Valve area (Vmax): 2.26 cm^2. Valve area (Vmean): 2.38   cm^2. - Mitral valve: There was mild regurgitation. - Atrial septum: No defect or patent foramen ovale was identified.  Labs/Other Tests and Data Reviewed:    EKG:  An ECG dated 03/12/2018 was personally reviewed today and demonstrated:  Sinus rhythm with IVCD and repolarization abnormalities.  Recent Labs: 06/23/2018: ALT 28   Recent Lipid Panel Lab Results  Component Value Date/Time   CHOL 138 06/23/2018 11:40 AM   TRIG 138 06/23/2018 11:40 AM   HDL 40 (L) 06/23/2018 11:40 AM   CHOLHDL 3.5 06/23/2018 11:40 AM   LDLCALC 70 06/23/2018 11:40 AM    Wt Readings from Last 3 Encounters:  09/14/18 159 lb (72.1 kg)  06/09/18 161 lb (73 kg)  03/12/18 167 lb (75.8 kg)     Objective:    Vital Signs:  There were no vitals taken for this visit.   {HeartCare Virtual Exam (Optional):[id number]::"VITAL SIGNS:  reviewed"}  ASSESSMENT & PLAN:    1. ***  COVID-19 Education: The signs and symptoms of COVID-19 were discussed with the patient and how to seek care for testing (follow up with PCP or arrange E-visit).  ***The importance of social distancing was discussed today.  Time:   Today, I have spent *** minutes with the patient with telehealth technology discussing the above problems.     Medication Adjustments/Labs and Tests Ordered: Current medicines are reviewed at length with the patient today.  Concerns regarding medicines are outlined above.   Tests Ordered: No orders of the defined types were placed in this encounter.   Medication Changes: No orders of the defined types were placed in this encounter.   Follow Up:  {F/U Format:[id number]} {follow up:15908}  Signed, Rozann Lesches, MD  05/27/2019 4:08 PM    [facility]

## 2019-05-28 ENCOUNTER — Other Ambulatory Visit: Payer: Self-pay

## 2019-05-28 ENCOUNTER — Telehealth: Payer: Self-pay | Admitting: Cardiology

## 2019-08-27 ENCOUNTER — Other Ambulatory Visit: Payer: Self-pay

## 2019-08-27 ENCOUNTER — Telehealth: Payer: Self-pay

## 2019-08-27 MED ORDER — CARVEDILOL 3.125 MG PO TABS: 3.1250 mg | ORAL_TABLET | Freq: Two times a day (BID) | ORAL | 3 refills | Status: AC

## 2019-08-27 MED ORDER — ATORVASTATIN CALCIUM 80 MG PO TABS: 80.0000 mg | ORAL_TABLET | Freq: Every day | ORAL | 3 refills | Status: AC

## 2019-08-27 NOTE — Telephone Encounter (Signed)
Virtual Visit Pre-Appointment Phone Call  "(Name), I am calling you today to discuss your upcoming appointment. We are currently trying to limit exposure to the virus that causes COVID-19 by seeing patients at home rather than in the office."  "What is the BEST phone number to call the day of the visit?" - include this in appointment notes  "Do you have or have access to (through a family member/friend) a smartphone with video capability that we can use for your visit?" If yes - list this number in appt notes as "cell" (if different from BEST phone #) and list the appointment type as a VIDEO visit in appointment notes If no - list the appointment type as a PHONE visit in appointment notes  Confirm consent - "In the setting of the current Covid19 crisis, you are scheduled for a (phone or video) visit with your provider on (date) at (time).  Just as we do with many in-office visits, in order for you to participate in this visit, we must obtain consent.  If you'd like, I can send this to your mychart (if signed up) or email for you to review.  Otherwise, I can obtain your verbal consent now.  All virtual visits are billed to your insurance company just like a normal visit would be.  By agreeing to a virtual visit, we'd like you to understand that the technology does not allow for your provider to perform an examination, and thus may limit your provider's ability to fully assess your condition. If your provider identifies any concerns that need to be evaluated in person, we will make arrangements to do so.  Finally, though the technology is pretty good, we cannot assure that it will always work on either your or our end, and in the setting of a video visit, we may have to convert it to a phone-only visit.  In either situation, we cannot ensure that we have a secure connection.  Are you willing to proceed?" STAFF: Did the patient verbally acknowledge consent to telehealth visit? Document YES/NO here:    Advise patient to be prepared - "Two hours prior to your appointment, go ahead and check your blood pressure, pulse, oxygen saturation, and your weight (if you have the equipment to check those) and write them all down. When your visit starts, your provider will ask you for this information. If you have an Apple Watch or Kardia device, please plan to have heart rate information ready on the day of your appointment. Please have a pen and paper handy nearby the day of the visit as well."  Give patient instructions for MyChart download to smartphone OR Doximity/Doxy.me as below if video visit (depending on what platform provider is using)  Inform patient they will receive a phone call 15 minutes prior to their appointment time (may be from unknown caller ID) so they should be prepared to answer    TELEPHONE CALL NOTE  Brian Ford has been deemed a candidate for a follow-up tele-health visit to limit community exposure during the Covid-19 pandemic. I spoke with the patient via phone to ensure availability of phone/video source, confirm preferred email & phone number, and discuss instructions and expectations.  I reminded Brian Ford to be prepared with any vital sign and/or heart rhythm information that could potentially be obtained via home monitoring, at the time of his visit. I reminded Brian Ford to expect a phone call prior to his visit.  Brian Ford 08/27/2019 11:57 AM  INSTRUCTIONS FOR DOWNLOADING THE MYCHART APP TO SMARTPHONE  - The patient must first make sure to have activated MyChart and know their login information - If Apple, go to [employer] and type in MyChart in the search bar and download the app. If Android, ask patient to go to [employer] and type in [place] in the search bar and download the app. The app is free but as with any other app downloads, their phone may require them to verify saved payment information or Apple/Android password.  - The patient  will need to then log into the app with their MyChart username and password, and select [facility] as their healthcare provider to link the account. When it is time for your visit, go to the MyChart app, find appointments, and click Begin Video Visit. Be sure to Select Allow for your device to access the Microphone and Camera for your visit. You will then be connected, and your provider will be with you shortly.  **If they have any issues connecting, or need assistance please contact MyChart service desk [phone])**  **If using a computer, in order to ensure the best quality for their visit they will need to use either of the following Internet Browsers: [employer], or Google Chrome**  IF USING DOXIMITY or DOXY.ME - The patient will receive a link just prior to their visit by text.     FULL LENGTH CONSENT FOR TELE-HEALTH VISIT   I hereby voluntarily request, consent and authorize [facility] and its employed or contracted physicians, physician assistants, nurse practitioners or other licensed health care professionals (the Practitioner), to provide me with telemedicine health care services (the "Services") as deemed necessary by the treating Practitioner. I acknowledge and consent to receive the Services by the Practitioner via telemedicine. I understand that the telemedicine visit will involve communicating with the Practitioner through live audiovisual communication technology and the disclosure of certain medical information by electronic transmission. I acknowledge that I have been given the opportunity to request an in-person assessment or other available alternative prior to the telemedicine visit and am voluntarily participating in the telemedicine visit.  I understand that I have the right to withhold or withdraw my consent to the use of telemedicine in the course of my care at any time, without affecting my right to future care or treatment, and that the Practitioner  or I may terminate the telemedicine visit at any time. I understand that I have the right to inspect all information obtained and/or recorded in the course of the telemedicine visit and may receive copies of available information for a reasonable fee.  I understand that some of the potential risks of receiving the Services via telemedicine include:  Delay or interruption in medical evaluation due to technological equipment failure or disruption; Information transmitted may not be sufficient (e.g. poor resolution of images) to allow for appropriate medical decision making by the Practitioner; and/or  In rare instances, security protocols could fail, causing a breach of personal health information.  Furthermore, I acknowledge that it is my responsibility to provide information about my medical history, conditions and care that is complete and accurate to the best of my ability. I acknowledge that Practitioner's advice, recommendations, and/or decision may be based on factors not within their control, such as incomplete or inaccurate data provided by me or distortions of diagnostic images or specimens that may result from electronic transmissions. I understand that the practice of medicine is not an exact science and that Practitioner  makes no warranties or guarantees regarding treatment outcomes. I acknowledge that I will receive a copy of this consent concurrently upon execution via email to the email address I last provided but may also request a printed copy by calling the office of [facility].    I understand that my insurance will be billed for this visit.   I have read or had this consent read to me. I understand the contents of this consent, which adequately explains the benefits and risks of the Services being provided via telemedicine.  I have been provided ample opportunity to ask questions regarding this consent and the Services and have had my questions answered to my satisfaction. I give  my informed consent for the services to be provided through the use of telemedicine in my medical care  By participating in this telemedicine visit I agree to the above.

## 2019-08-27 NOTE — Telephone Encounter (Signed)
Pt missed his las virtual visit. He rescheduled today. However he would like a phone call re:out of some of his meds. Please call [phone]  Thanks renee

## 2019-08-27 NOTE — Telephone Encounter (Signed)
REFILL'S SENT  

## 2019-09-09 ENCOUNTER — Encounter: Payer: Self-pay | Admitting: Cardiology

## 2019-09-09 NOTE — Progress Notes (Deleted)
{Choose 1 Note Type (Telehealth Visit or Telephone Visit):[phone]}   Date:  09/09/2019   ID:  Brian Ford, DOB 1965/12/25, MRN [id number]  {Patient Location:[phone]::"Home"} {Provider [phone]::"Home"}  PCP:  Patient, No Pcp Per  Cardiologist:  Nona Dell, MD Electrophysiologist:  None   Evaluation Performed:  {Choose Visit Type:[phone]::"Follow-Up Visit"}  Chief Complaint:  ***  History of Present Illness:    Brian Ford is a 54 y.o. male last seen in November 2019.  He did not stay to keep his scheduled visit in February 2020, refer to chart note.  Echocardiogram from November 2019 revealed LVEF 20 to 25% range with diffuse hypokinesis and akinesis of the mid anteroseptal wall, mild diastolic dysfunction.  Mild mitral and aortic regurgitation were noted.  The patient {does/does not:200015} have symptoms concerning for COVID-19 infection (fever, chills, cough, or new shortness of breath).    Past Medical History:  Diagnosis Date  . Anxiety   . CAD (coronary artery disease) 2012   a. PCI of 100% RCA (2012) b. s/p BMS to LAD c. 02/2018: Anterior STEMI with DES to LAD; CTO of RCA with collaterals present.   . Crack cocaine use   . Depression   . GERD (gastroesophageal reflux disease)   . Hyperlipidemia   . Hypertension   . Normocytic anemia   . ST elevation myocardial infarction (STEMI) of inferior wall [facility])    Past Surgical History:  Procedure Laterality Date  . CORONARY ANGIOPLASTY    . CORONARY/GRAFT ACUTE MI REVASCULARIZATION N/A 03/03/2018   Procedure: Coronary/Graft Acute MI Revascularization;  Surgeon: Tonny Bollman, MD;  Location: [facility] INVASIVE CV LAB;  Service: Cardiovascular;  Laterality: N/A;  . LEFT HEART CATH AND CORONARY ANGIOGRAPHY N/A 03/03/2018   Procedure: LEFT HEART CATH AND CORONARY ANGIOGRAPHY;  Surgeon: Tonny Bollman, MD;  Location: [facility] INVASIVE CV LAB;  Service: Cardiovascular;  Laterality: N/A;  . LEFT HEART  CATHETERIZATION WITH CORONARY ANGIOGRAM N/A 05/19/2014   Procedure: LEFT HEART CATHETERIZATION WITH CORONARY ANGIOGRAM;  Surgeon: Marykay Lex, MD;  Location: [facility] CATH LAB;  Service: Cardiovascular;  Laterality: N/A;  . NOSE SURGERY    . PERCUTANEOUS CORONARY STENT INTERVENTION (PCI-S)  05/19/2014   Procedure: PERCUTANEOUS CORONARY STENT INTERVENTION (PCI-S);  Surgeon: Marykay Lex, MD;  Location: [facility] CATH LAB;  Service: Cardiovascular;;  BMS to Mid LAD     No outpatient medications have been marked as taking for the 09/10/19 encounter (Appointment) with Jonelle Sidle, MD.     Allergies:   Patient has no known allergies.   Social History   Tobacco Use  . Smoking status: Current Every Day Smoker    Packs/day: 1.00    Years: 35.00    Pack years: 35.00    Types: Cigarettes  . Smokeless tobacco: Never Used  Substance Use Topics  . Alcohol use: No  . Drug use: Yes    Types: "Crack" cocaine, Oxycodone, Benzodiazepines, Cocaine, Marijuana     Family Hx: The patient's family history includes Diabetes in his brother and sister; Heart disease in his mother; Hypertension in his mother; Stroke in his mother.  ROS:   Please see the history of present illness.    *** All other systems reviewed and are negative.   Prior CV studies:   The following studies were reviewed today:  Echocardiogram 06/23/2018: Study Conclusions   - Left ventricle: The cavity size was normal. Wall thickness was  increased in a pattern of moderate LVH. Systolic function was  severely reduced. The  estimated ejection fraction was in the  range of 20% to 25%. Diffuse hypokinesis. Doppler parameters are  consistent with abnormal left ventricular relaxation (grade 1  diastolic dysfunction).  - Regional wall motion abnormality: Akinesis of the mid  anteroseptal myocardium.  - Aortic valve: There was mild regurgitation. Valve area (VTI):  1.98 cm^2. Valve area (Vmax): 2.26 cm^2. Valve area  (Vmean): 2.38  cm^2.  - Mitral valve: There was mild regurgitation.  - Atrial septum: No defect or patent foramen ovale was identified.   Labs/Other Tests and Data Reviewed:    EKG:  An ECG dated 03/12/2018 was personally reviewed today and demonstrated:  Sinus rhythm with IVCD of left bundle branch block type.  Recent Labs: Lab Results  Component Value Date/Time   CHOL 138 06/23/2018 11:40 AM   TRIG 138 06/23/2018 11:40 AM   HDL 40 (L) 06/23/2018 11:40 AM   CHOLHDL 3.5 06/23/2018 11:40 AM   LDLCALC 70 06/23/2018 11:40 AM  November 2019: AST 23, ALT 28  Wt Readings from Last 3 Encounters:  09/14/18 159 lb (72.1 kg)  06/09/18 161 lb (73 kg)  03/12/18 167 lb (75.8 kg)     Objective:    Vital Signs:  There were no vitals taken for this visit.   {[facility] Virtual Exam (Optional):[phone]::"VITAL SIGNS:  reviewed"}  ASSESSMENT & PLAN:    1. ***  COVID-19 Education: The signs and symptoms of COVID-19 were discussed with the patient and how to seek care for testing (follow up with PCP or arrange E-visit).  ***The importance of social distancing was discussed today.  Time:   Today, I have spent *** minutes with the patient with telehealth technology discussing the above problems.     Medication Adjustments/Labs and Tests Ordered: Current medicines are reviewed at length with the patient today.  Concerns regarding medicines are outlined above.   Tests Ordered: No orders of the defined types were placed in this encounter.   Medication Changes: No orders of the defined types were placed in this encounter.   Follow Up:  {F/U [phone]} {follow up:15908}  Signed, Rozann Lesches, MD  09/09/2019 1:01 PM    [place]

## 2019-09-10 ENCOUNTER — Telehealth: Payer: Self-pay | Admitting: Cardiology

## 2020-05-10 ENCOUNTER — Emergency Department (HOSPITAL_COMMUNITY): Payer: Self-pay

## 2020-05-10 ENCOUNTER — Encounter (HOSPITAL_COMMUNITY): Payer: Self-pay

## 2020-05-10 ENCOUNTER — Other Ambulatory Visit: Payer: Self-pay

## 2020-05-10 ENCOUNTER — Emergency Department (HOSPITAL_COMMUNITY)
Admission: EM | Admit: 2020-05-10 | Discharge: 2020-05-11 | Disposition: A | Discharge status: Home / Self Care | Payer: Self-pay | Attending: Emergency Medicine | Admitting: Emergency Medicine

## 2020-05-10 ENCOUNTER — Emergency Department (HOSPITAL_COMMUNITY)
Admission: EM | Admit: 2020-05-10 | Discharge: 2020-05-10 | Disposition: A | Discharge status: Home / Self Care | Payer: Self-pay | Attending: Emergency Medicine | Admitting: Emergency Medicine

## 2020-05-10 ENCOUNTER — Emergency Department (HOSPITAL_COMMUNITY): Admission: EM | Admit: 2020-05-10 | Discharge: 2020-05-10 | Discharge status: Against Medical Advice | Payer: Self-pay

## 2020-05-10 DIAGNOSIS — Z7901 Long term (current) use of anticoagulants: Secondary | ICD-10-CM | POA: Insufficient documentation

## 2020-05-10 DIAGNOSIS — I509 Heart failure, unspecified: Secondary | ICD-10-CM | POA: Insufficient documentation

## 2020-05-10 DIAGNOSIS — Z7982 Long term (current) use of aspirin: Secondary | ICD-10-CM | POA: Insufficient documentation

## 2020-05-10 DIAGNOSIS — F17210 Nicotine dependence, cigarettes, uncomplicated: Secondary | ICD-10-CM | POA: Insufficient documentation

## 2020-05-10 DIAGNOSIS — I10 Essential (primary) hypertension: Secondary | ICD-10-CM | POA: Insufficient documentation

## 2020-05-10 DIAGNOSIS — I213 ST elevation (STEMI) myocardial infarction of unspecified site: Secondary | ICD-10-CM | POA: Insufficient documentation

## 2020-05-10 DIAGNOSIS — I110 Hypertensive heart disease with heart failure: Secondary | ICD-10-CM | POA: Insufficient documentation

## 2020-05-10 DIAGNOSIS — R1084 Generalized abdominal pain: Secondary | ICD-10-CM | POA: Insufficient documentation

## 2020-05-10 DIAGNOSIS — I2510 Atherosclerotic heart disease of native coronary artery without angina pectoris: Secondary | ICD-10-CM | POA: Insufficient documentation

## 2020-05-10 DIAGNOSIS — Z955 Presence of coronary angioplasty implant and graft: Secondary | ICD-10-CM | POA: Insufficient documentation

## 2020-05-10 DIAGNOSIS — R1110 Vomiting, unspecified: Secondary | ICD-10-CM | POA: Insufficient documentation

## 2020-05-10 DIAGNOSIS — K529 Noninfective gastroenteritis and colitis, unspecified: Secondary | ICD-10-CM | POA: Insufficient documentation

## 2020-05-10 DIAGNOSIS — Z79899 Other long term (current) drug therapy: Secondary | ICD-10-CM | POA: Insufficient documentation

## 2020-05-10 LAB — URINALYSIS, ROUTINE W REFLEX MICROSCOPIC
Bacteria, UA: NONE SEEN
Bilirubin Urine: NEGATIVE
Glucose, UA: 50 mg/dL — AB
Ketones, ur: 20 mg/dL — AB
Leukocytes,Ua: NEGATIVE
Nitrite: NEGATIVE
Protein, ur: 30 mg/dL — AB
Specific Gravity, Urine: >1.046 — ABNORMAL HIGH (ref 1.005–1.030)
pH: 6 (ref 5.0–8.0)

## 2020-05-10 LAB — CBC
HCT: 48.8 % (ref 39.0–52.0)
Hemoglobin: 16.1 g/dL (ref 13.0–17.0)
MCH: 28.4 pg (ref 26.0–34.0)
MCHC: 33 g/dL (ref 30.0–36.0)
MCV: 86.1 fL (ref 80.0–100.0)
Platelets: 251 10*3/uL (ref 150–400)
RBC: 5.67 MIL/uL (ref 4.22–5.81)
RDW: 13.4 % (ref 11.5–15.5)
WBC: 9.6 10*3/uL (ref 4.0–10.5)
nRBC: 0 % (ref 0.0–0.2)

## 2020-05-10 LAB — COMPREHENSIVE METABOLIC PANEL
ALT: 14 U/L (ref 0–44)
AST: 27 U/L (ref 15–41)
Albumin: 3.8 g/dL (ref 3.5–5.0)
Alkaline Phosphatase: 69 U/L (ref 38–126)
Anion gap: 9 (ref 5–15)
BUN: 15 mg/dL (ref 6–20)
CO2: 26 mmol/L (ref 22–32)
Calcium: 9.2 mg/dL (ref 8.9–10.3)
Chloride: 102 mmol/L (ref 98–111)
Creatinine, Ser: 1.28 mg/dL — ABNORMAL HIGH (ref 0.61–1.24)
GFR calc non Af Amer: >60 mL/min (ref >60)
Glucose, Bld: 110 mg/dL — ABNORMAL HIGH (ref 70–99)
Potassium: 4.5 mmol/L (ref 3.5–5.1)
Sodium: 137 mmol/L (ref 135–145)
Total Bilirubin: 1 mg/dL (ref 0.3–1.2)
Total Protein: 7.6 g/dL (ref 6.5–8.1)

## 2020-05-10 LAB — LIPASE, BLOOD: Lipase: 44 U/L (ref 11–51)

## 2020-05-10 MED ORDER — FENTANYL CITRATE (PF) 100 MCG/2ML IJ SOLN
50.0000 ug | Freq: Once | INTRAMUSCULAR | Status: AC
  Administered 2020-05-10: 50 ug via INTRAVENOUS
  Filled 2020-05-10: qty 2

## 2020-05-10 MED ORDER — LIDOCAINE VISCOUS HCL 2 % MT SOLN: 15.0000 mL | Freq: Once | OROMUCOSAL | Status: DC

## 2020-05-10 MED ORDER — SODIUM CHLORIDE 0.9 % IV SOLN: INTRAVENOUS | Status: DC

## 2020-05-10 MED ORDER — ONDANSETRON HCL 4 MG/2ML IJ SOLN
4.0000 mg | Freq: Once | INTRAMUSCULAR | Status: AC
  Administered 2020-05-10: 4 mg via INTRAVENOUS
  Filled 2020-05-10: qty 2

## 2020-05-10 MED ORDER — SODIUM CHLORIDE 0.9 % IV BOLUS
1000.0000 mL | Freq: Once | INTRAVENOUS | Status: AC
  Administered 2020-05-10: 1000 mL via INTRAVENOUS

## 2020-05-10 MED ORDER — ALUM & MAG HYDROXIDE-SIMETH 200-200-20 MG/5ML PO SUSP: 30.0000 mL | Freq: Once | ORAL | Status: DC

## 2020-05-10 MED ORDER — PANTOPRAZOLE SODIUM 40 MG IV SOLR: 40.0000 mg | Freq: Once | INTRAVENOUS | Status: DC

## 2020-05-10 MED ORDER — IOHEXOL 300 MG/ML  SOLN
100.0000 mL | Freq: Once | INTRAMUSCULAR | Status: AC | PRN
  Administered 2020-05-10: 100 mL via INTRAVENOUS

## 2020-05-10 MED ORDER — HYDROMORPHONE HCL 1 MG/ML IJ SOLN
1.0000 mg | Freq: Once | INTRAMUSCULAR | Status: AC
  Administered 2020-05-10: 1 mg via INTRAVENOUS
  Filled 2020-05-10: qty 1

## 2020-05-10 NOTE — ED Notes (Signed)
Pt walked out AMA, Dr. Bernette Mayers notified

## 2020-05-10 NOTE — ED Notes (Signed)
Pt called for triage x 1, no answer. Screen at front desk reported to RN that pt just left prior to be triaged.

## 2020-05-10 NOTE — ED Provider Notes (Signed)
[facility] EMERGENCY DEPARTMENT Provider Note  CSN: [id number] Arrival date & time: 05/10/20 1046    History Chief Complaint  Patient presents with  . Abdominal Pain    HPI  Brian Ford is a 54 y.o. male presents for evaluation of abdominal pain. He reports his mother fell at home this morning and he had to pick her up. He did not have any pain then but later in the morning after eating eggs for breakfast he had sudden onset of severe diffuse abdominal pain and one episode of vomiting. He denies any diarrhea. No blood in emesis. No recent constipation or black stools. He takes a blood thinner 'every once in a while' but does not know the name of it. He also occasionally takes TUMS but denies any NSAID use. He denies alcohol use.   Patient apparently came to the ED earlier today, left after just a few minutes because he hadn't been triaged yet. Drove his car a short ways down the road, got out and called EMS but then drove back to the ED and checked back in.    Past Medical History:  Diagnosis Date  . Anxiety   . CAD (coronary artery disease) 2012   a. PCI of 100% RCA (2012) b. s/p BMS to LAD c. 02/2018: Anterior STEMI with DES to LAD; CTO of RCA with collaterals present.   . Crack cocaine use   . Depression   . GERD (gastroesophageal reflux disease)   . Hyperlipidemia   . Hypertension   . Normocytic anemia   . ST elevation myocardial infarction (STEMI) of inferior wall [facility])     Past Surgical History:  Procedure Laterality Date  . CORONARY ANGIOPLASTY    . CORONARY/GRAFT ACUTE MI REVASCULARIZATION N/A 03/03/2018   Procedure: Coronary/Graft Acute MI Revascularization;  Surgeon: Tonny Bollman, MD;  Location: [facility] INVASIVE CV LAB;  Service: Cardiovascular;  Laterality: N/A;  . LEFT HEART CATH AND CORONARY ANGIOGRAPHY N/A 03/03/2018   Procedure: LEFT HEART CATH AND CORONARY ANGIOGRAPHY;  Surgeon: Tonny Bollman, MD;  Location: [facility] INVASIVE CV LAB;  Service: Cardiovascular;   Laterality: N/A;  . LEFT HEART CATHETERIZATION WITH CORONARY ANGIOGRAM N/A 05/19/2014   Procedure: LEFT HEART CATHETERIZATION WITH CORONARY ANGIOGRAM;  Surgeon: Marykay Lex, MD;  Location: [facility] CATH LAB;  Service: Cardiovascular;  Laterality: N/A;  . NOSE SURGERY    . PERCUTANEOUS CORONARY STENT INTERVENTION (PCI-S)  05/19/2014   Procedure: PERCUTANEOUS CORONARY STENT INTERVENTION (PCI-S);  Surgeon: Marykay Lex, MD;  Location: [facility] CATH LAB;  Service: Cardiovascular;;  BMS to Mid LAD    Family History  Problem Relation Age of Onset  . Hypertension Mother   . Heart disease Mother   . Stroke Mother   . Diabetes Sister   . Diabetes Brother     Social History   Tobacco Use  . Smoking status: Current Every Day Smoker    Packs/day: 1.00    Years: 35.00    Pack years: 35.00    Types: Cigarettes  . Smokeless tobacco: Never Used  Vaping Use  . Vaping Use: Never used  Substance Use Topics  . Alcohol use: No  . Drug use: Yes    Types: "Crack" cocaine, Oxycodone, Benzodiazepines, Cocaine, Marijuana     Home Medications Prior to Admission medications   Medication Sig Start Date End Date Taking? Authorizing Provider  aspirin EC 81 MG tablet Take 1 tablet (81 mg total) by mouth daily. 03/04/18   Azalee Course, PA  atorvastatin (  LIPITOR) 80 MG tablet Take 1 tablet (80 mg total) by mouth daily at 6 PM. 08/27/19   Jonelle Sidle, MD  carvedilol (COREG) 3.125 MG tablet Take 1 tablet (3.125 mg total) by mouth 2 (two) times daily. 08/27/19 08/26/20  Jonelle Sidle, MD  nitroGLYCERIN (NITROSTAT) 0.4 MG SL tablet Place 1 tablet (0.4 mg total) under the tongue every 5 (five) minutes x 3 doses as needed for chest pain. 03/12/18   Strader, Lennart Pall, PA-C  ticagrelor (BRILINTA) 90 MG TABS tablet Take 1 tablet (90 mg total) by mouth 2 (two) times daily. 03/12/18   Ellsworth Lennox, PA-C     Allergies    Patient has no known allergies.   Review of Systems   Review of Systems A  comprehensive review of systems was completed and negative except as noted in HPI.    Physical Exam BP 127/89 (BP Location: Right Arm)   Pulse 78   Temp 97.8 F (36.6 C) (Oral)   Resp 16   Ht 5\' 8"  (1.727 m)   Wt 68 kg   SpO2 100%   BMI 22.81 kg/m   Physical Exam Vitals and nursing note reviewed.  Constitutional:      Appearance: Normal appearance.     Comments: Uncomfortable appearing  HENT:     Head: Normocephalic and atraumatic.     Nose: Nose normal.     Mouth/Throat:     Mouth: Mucous membranes are moist.  Eyes:     Extraocular Movements: Extraocular movements intact.     Conjunctiva/sclera: Conjunctivae normal.  Cardiovascular:     Rate and Rhythm: Normal rate.  Pulmonary:     Effort: Pulmonary effort is normal.     Breath sounds: Normal breath sounds.  Abdominal:     General: Abdomen is flat.     Palpations: Abdomen is soft.     Tenderness: There is generalized abdominal tenderness. There is guarding.  Musculoskeletal:        General: No swelling. Normal range of motion.     Cervical back: Neck supple.  Skin:    General: Skin is warm and dry.  Neurological:     General: No focal deficit present.     Mental Status: He is alert.  Psychiatric:        Mood and Affect: Mood normal.      ED Results / Procedures / Treatments   Labs (all labs ordered are listed, but only abnormal results are displayed) Labs Reviewed  COMPREHENSIVE METABOLIC PANEL - Abnormal; Notable for the following components:      Result Value   Glucose, Bld 110 (*)    Creatinine, Ser 1.28 (*)    All other components within normal limits  LIPASE, BLOOD  CBC  URINALYSIS, ROUTINE W REFLEX MICROSCOPIC    EKG EKG Interpretation  Date/Time:  Wednesday May 10 2020 10:56:50 EDT Ventricular Rate:  65 PR Interval:    QRS Duration: 122 QT Interval:  459 QTC Calculation: 478 R Axis:   89 Text Interpretation: Sinus rhythm Biatrial enlargement LVH with secondary repolarization  abnormality Anterior infarct, old No significant change since last tracing Confirmed by [id number] [id number]) on 05/10/2020 11:18:02 AM   Radiology CT Abdomen Pelvis W Contrast  Result Date: 05/10/2020 CLINICAL DATA:  Abdominal pain and vomiting. EXAM: CT ABDOMEN AND PELVIS WITH CONTRAST TECHNIQUE: Multidetector CT imaging of the abdomen and pelvis was performed using the standard protocol following bolus administration of intravenous contrast. CONTRAST:  07/10/2020 OMNIPAQUE IOHEXOL  300 MG/ML  SOLN COMPARISON:  None. FINDINGS: Lower chest: No acute abnormality. Hepatobiliary: No focal liver abnormality is seen. No gallstones, gallbladder wall thickening, or biliary dilatation. Pancreas: Unremarkable. No pancreatic ductal dilatation or surrounding inflammatory changes. Spleen: Normal in size without focal abnormality. Adrenals/Urinary Tract: Adrenal glands are unremarkable. Kidneys are normal, without renal calculi, focal lesion, or hydronephrosis. Bladder is unremarkable for the degree of distention. Stomach/Bowel: Stomach is within normal limits. There are few loops of non-dilated fluid-filled small bowel in the right upper abdomen with trace interloop ascites. No obstruction. Normal appendix. Vascular/Lymphatic: Aortic atherosclerosis. No enlarged abdominal or pelvic lymph nodes. Reproductive: Prostate is unremarkable. Other: No abdominal wall hernia or abnormality. Trace free fluid in the pelvis. No pneumoperitoneum. Musculoskeletal: No acute or significant osseous findings. IMPRESSION: 1. Non-dilated fluid-filled small bowel in the right upper abdomen with trace ascites, suggestive of enteritis. No obstruction. 2. Aortic Atherosclerosis (ICD10-I70.0). Electronically Signed   By: Obie Dredge M.D.   On: 05/10/2020 12:52    Procedures Procedures  Medications Ordered in the ED Medications  pantoprazole (PROTONIX) injection 40 mg (has no administration in time range)  alum & mag hydroxide-simeth  (MAALOX/MYLANTA) 200-200-20 MG/5ML suspension 30 mL (has no administration in time range)    And  lidocaine (XYLOCAINE) 2 % viscous mouth solution 15 mL (has no administration in time range)  sodium chloride 0.9 % bolus 1,000 mL (1,000 mLs Intravenous New Bag/Given 05/10/20 1127)  ondansetron (ZOFRAN) injection 4 mg (4 mg Intravenous Given 05/10/20 1132)  fentaNYL (SUBLIMAZE) injection 50 mcg (50 mcg Intravenous Given 05/10/20 1132)  iohexol (OMNIPAQUE) 300 MG/ML solution 100 mL (100 mLs Intravenous Contrast Given 05/10/20 1225)     MDM Rules/Calculators/A&P MDM Patient writhing in bed, complaining that 'no one is doing anything for me'. I explained the process of ED evaluation and that he would need to go through the process of our labs and imaging studies to complete his evaluation. Pain and nausea meds ordered as well as labs and a CT.  ED Course  I have reviewed the triage vital signs and the nursing notes.  Pertinent labs & imaging results that were available during my care of the patient were reviewed by me and considered in my medical decision making (see chart for details).  Clinical Course as of May 10 1317  Wed May 10, 2020  1203 CBC are normal, CMP and lipase normal.    [CS]  1259 CT images and results reviewed, no concerning findings.    [CS]  1309 Patient continues to moan in pain, state no relief with fentanyl. No vomiting since arrival, he is convinced he has food poisoning and requesting to have his 'stomach pumped'. Advised that this isn't a procedure done in the ED. Will give additional medication for pain control.    [CS]  1314 Patient took his IVs out and got dressed. Seen walking out the door bent over with a steady gait. Did not stay for me to discuss his disposition but overhead saying 'ya'll aren't doing anything for me'.    [CS]    Clinical Course User Index [CS] Pollyann Savoy, MD    Final Clinical Impression(s) / ED Diagnoses Final diagnoses:  Generalized  abdominal pain    Rx / DC Orders ED Discharge Orders    None       Pollyann Savoy, MD 05/10/20 1318

## 2020-05-10 NOTE — ED Notes (Signed)
Pt notified registration that he was leaving department. Pt seen getting into his vehicle.

## 2020-05-10 NOTE — ED Notes (Signed)
Pt has pulled off cardiac monitor leads, as well as blood pressure cuff and O2 monitor. RN reapplied all leads and explained to patient importance of keeping them on.

## 2020-05-10 NOTE — ED Notes (Signed)
Pt continues to remove monitor leads despite repeated attempts to explain the need to keep them on.

## 2020-05-10 NOTE — ED Triage Notes (Addendum)
Pt c/o abd pain, vomiting, and lower back pain.  Was here earlier and left.  EMS says pt tried to "pierce the side of his penis"  And attempted to pierce his naval  several weeks ago and has been having painful urination for the past couple of weeks. EMS placed 18g in R ac.  Pt moaning and yelling upon arrival.

## 2020-05-10 NOTE — ED Triage Notes (Signed)
Pt reports his mother fell and he had to pick her up this morning.  Afterwards he ate  Breakfast and started having severe generalized abd pain.  Pt restless in waiting room, laying across chairs.  Pt says he vomited once this morning.  Denies any diarrhea.

## 2020-05-10 NOTE — ED Notes (Addendum)
Pt critical recorded on wrong pt   This pt has had no labs drawn

## 2020-05-10 NOTE — ED Notes (Signed)
Pt yelling out in room, continues to state "you all aren't doing anything for me".  Pt states "I have someone coming to take care of it".  When questioned about this as a threat, pt states "Oh, I just mean they want you all to do something for me".   Informed pt the extended wait is due to emergencies that the doctor is taking care of.

## 2020-05-11 ENCOUNTER — Emergency Department (HOSPITAL_COMMUNITY): Payer: Self-pay

## 2020-05-11 ENCOUNTER — Inpatient Hospital Stay (HOSPITAL_COMMUNITY)
Admission: EM | Admit: 2020-05-11 | Discharge: 2020-05-15 | DRG: 330 | Discharge status: Against Medical Advice | Payer: Self-pay | Attending: General Surgery | Admitting: General Surgery

## 2020-05-11 ENCOUNTER — Encounter (HOSPITAL_COMMUNITY): Payer: Self-pay | Admitting: *Deleted

## 2020-05-11 ENCOUNTER — Emergency Department (HOSPITAL_COMMUNITY): Payer: Self-pay | Admitting: Anesthesiology

## 2020-05-11 ENCOUNTER — Encounter (HOSPITAL_COMMUNITY)
Admission: EM | Discharge status: Against Medical Advice | Payer: Self-pay | Source: Home / Self Care | Attending: General Surgery

## 2020-05-11 ENCOUNTER — Other Ambulatory Visit: Payer: Self-pay

## 2020-05-11 DIAGNOSIS — E872 Acidosis: Secondary | ICD-10-CM | POA: Diagnosis present

## 2020-05-11 DIAGNOSIS — Z5329 Procedure and treatment not carried out because of patient's decision for other reasons: Secondary | ICD-10-CM | POA: Diagnosis not present

## 2020-05-11 DIAGNOSIS — R0600 Dyspnea, unspecified: Secondary | ICD-10-CM

## 2020-05-11 DIAGNOSIS — Z9049 Acquired absence of other specified parts of digestive tract: Secondary | ICD-10-CM

## 2020-05-11 DIAGNOSIS — Z8249 Family history of ischemic heart disease and other diseases of the circulatory system: Secondary | ICD-10-CM

## 2020-05-11 DIAGNOSIS — R64 Cachexia: Secondary | ICD-10-CM | POA: Diagnosis present

## 2020-05-11 DIAGNOSIS — F419 Anxiety disorder, unspecified: Secondary | ICD-10-CM | POA: Diagnosis present

## 2020-05-11 DIAGNOSIS — E785 Hyperlipidemia, unspecified: Secondary | ICD-10-CM | POA: Diagnosis present

## 2020-05-11 DIAGNOSIS — K219 Gastro-esophageal reflux disease without esophagitis: Secondary | ICD-10-CM | POA: Diagnosis present

## 2020-05-11 DIAGNOSIS — I5042 Chronic combined systolic (congestive) and diastolic (congestive) heart failure: Secondary | ICD-10-CM | POA: Diagnosis present

## 2020-05-11 DIAGNOSIS — K660 Peritoneal adhesions (postprocedural) (postinfection): Secondary | ICD-10-CM | POA: Diagnosis present

## 2020-05-11 DIAGNOSIS — T466X6A Underdosing of antihyperlipidemic and antiarteriosclerotic drugs, initial encounter: Secondary | ICD-10-CM | POA: Diagnosis present

## 2020-05-11 DIAGNOSIS — T45526A Underdosing of antithrombotic drugs, initial encounter: Secondary | ICD-10-CM | POA: Diagnosis present

## 2020-05-11 DIAGNOSIS — I472 Ventricular tachycardia: Secondary | ICD-10-CM | POA: Diagnosis not present

## 2020-05-11 DIAGNOSIS — R778 Other specified abnormalities of plasma proteins: Secondary | ICD-10-CM | POA: Diagnosis present

## 2020-05-11 DIAGNOSIS — Z7689 Persons encountering health services in other specified circumstances: Secondary | ICD-10-CM

## 2020-05-11 DIAGNOSIS — F17210 Nicotine dependence, cigarettes, uncomplicated: Secondary | ICD-10-CM | POA: Diagnosis present

## 2020-05-11 DIAGNOSIS — I110 Hypertensive heart disease with heart failure: Secondary | ICD-10-CM | POA: Diagnosis present

## 2020-05-11 DIAGNOSIS — K559 Vascular disorder of intestine, unspecified: Secondary | ICD-10-CM | POA: Diagnosis present

## 2020-05-11 DIAGNOSIS — I255 Ischemic cardiomyopathy: Secondary | ICD-10-CM | POA: Diagnosis present

## 2020-05-11 DIAGNOSIS — I252 Old myocardial infarction: Secondary | ICD-10-CM

## 2020-05-11 DIAGNOSIS — Z955 Presence of coronary angioplasty implant and graft: Secondary | ICD-10-CM

## 2020-05-11 DIAGNOSIS — T39016A Underdosing of aspirin, initial encounter: Secondary | ICD-10-CM | POA: Diagnosis present

## 2020-05-11 DIAGNOSIS — Z720 Tobacco use: Secondary | ICD-10-CM | POA: Diagnosis present

## 2020-05-11 DIAGNOSIS — F1410 Cocaine abuse, uncomplicated: Secondary | ICD-10-CM | POA: Diagnosis present

## 2020-05-11 DIAGNOSIS — R079 Chest pain, unspecified: Secondary | ICD-10-CM

## 2020-05-11 DIAGNOSIS — J439 Emphysema, unspecified: Secondary | ICD-10-CM | POA: Diagnosis present

## 2020-05-11 DIAGNOSIS — E876 Hypokalemia: Secondary | ICD-10-CM

## 2020-05-11 DIAGNOSIS — I2510 Atherosclerotic heart disease of native coronary artery without angina pectoris: Secondary | ICD-10-CM | POA: Diagnosis present

## 2020-05-11 DIAGNOSIS — E871 Hypo-osmolality and hyponatremia: Secondary | ICD-10-CM | POA: Diagnosis not present

## 2020-05-11 DIAGNOSIS — T447X6A Underdosing of beta-adrenoreceptor antagonists, initial encounter: Secondary | ICD-10-CM | POA: Diagnosis present

## 2020-05-11 DIAGNOSIS — K56609 Unspecified intestinal obstruction, unspecified as to partial versus complete obstruction: Secondary | ICD-10-CM | POA: Insufficient documentation

## 2020-05-11 DIAGNOSIS — Z20822 Contact with and (suspected) exposure to covid-19: Secondary | ICD-10-CM | POA: Diagnosis present

## 2020-05-11 DIAGNOSIS — Z6822 Body mass index (BMI) 22.0-22.9, adult: Secondary | ICD-10-CM

## 2020-05-11 DIAGNOSIS — N3001 Acute cystitis with hematuria: Secondary | ICD-10-CM | POA: Diagnosis present

## 2020-05-11 DIAGNOSIS — E869 Volume depletion, unspecified: Secondary | ICD-10-CM | POA: Diagnosis not present

## 2020-05-11 DIAGNOSIS — K450 Other specified abdominal hernia with obstruction, without gangrene: Principal | ICD-10-CM | POA: Diagnosis present

## 2020-05-11 DIAGNOSIS — Z823 Family history of stroke: Secondary | ICD-10-CM

## 2020-05-11 DIAGNOSIS — Z833 Family history of diabetes mellitus: Secondary | ICD-10-CM

## 2020-05-11 DIAGNOSIS — Z91138 Patient's unintentional underdosing of medication regimen for other reason: Secondary | ICD-10-CM

## 2020-05-11 HISTORY — PX: LAPAROTOMY: SHX154

## 2020-05-11 HISTORY — PX: BOWEL RESECTION: SHX1257

## 2020-05-11 LAB — CBC WITH DIFFERENTIAL/PLATELET
Abs Immature Granulocytes: 0.12 10*3/uL — ABNORMAL HIGH (ref 0.00–0.07)
Basophils Absolute: 0.1 10*3/uL (ref 0.0–0.1)
Basophils Relative: 0 %
Eosinophils Absolute: 0 10*3/uL (ref 0.0–0.5)
Eosinophils Relative: 0 %
HCT: 48.7 % (ref 39.0–52.0)
Hemoglobin: 16.2 g/dL (ref 13.0–17.0)
Immature Granulocytes: 1 %
Lymphocytes Relative: 7 %
Lymphs Abs: 1.3 10*3/uL (ref 0.7–4.0)
MCH: 28.1 pg (ref 26.0–34.0)
MCHC: 33.3 g/dL (ref 30.0–36.0)
MCV: 84.4 fL (ref 80.0–100.0)
Monocytes Absolute: 0.7 10*3/uL (ref 0.1–1.0)
Monocytes Relative: 4 %
Neutro Abs: 18.4 10*3/uL — ABNORMAL HIGH (ref 1.7–7.7)
Neutrophils Relative %: 88 %
Platelets: 326 10*3/uL (ref 150–400)
RBC: 5.77 MIL/uL (ref 4.22–5.81)
RDW: 13.2 % (ref 11.5–15.5)
WBC: 20.6 10*3/uL — ABNORMAL HIGH (ref 4.0–10.5)
nRBC: 0 % (ref 0.0–0.2)

## 2020-05-11 LAB — COMPREHENSIVE METABOLIC PANEL
ALT: 15 U/L (ref 0–44)
AST: 21 U/L (ref 15–41)
Albumin: 3.9 g/dL (ref 3.5–5.0)
Alkaline Phosphatase: 69 U/L (ref 38–126)
Anion gap: 12 (ref 5–15)
BUN: 19 mg/dL (ref 6–20)
CO2: 24 mmol/L (ref 22–32)
Calcium: 9.2 mg/dL (ref 8.9–10.3)
Chloride: 102 mmol/L (ref 98–111)
Creatinine, Ser: 1.1 mg/dL (ref 0.61–1.24)
GFR calc non Af Amer: >60 mL/min (ref >60)
Glucose, Bld: 156 mg/dL — ABNORMAL HIGH (ref 70–99)
Potassium: 3.5 mmol/L (ref 3.5–5.1)
Sodium: 138 mmol/L (ref 135–145)
Total Bilirubin: 1 mg/dL (ref 0.3–1.2)
Total Protein: 7 g/dL (ref 6.5–8.1)

## 2020-05-11 LAB — RAPID URINE DRUG SCREEN, HOSP PERFORMED
Amphetamines: NOT DETECTED
Barbiturates: NOT DETECTED
Benzodiazepines: NOT DETECTED
Cocaine: POSITIVE — AB
Opiates: POSITIVE — AB
Tetrahydrocannabinol: NOT DETECTED

## 2020-05-11 LAB — URINALYSIS, ROUTINE W REFLEX MICROSCOPIC
Bacteria, UA: NONE SEEN
Bilirubin Urine: NEGATIVE
Glucose, UA: 50 mg/dL — AB
Ketones, ur: 5 mg/dL — AB
Leukocytes,Ua: NEGATIVE
Nitrite: POSITIVE — AB
Protein, ur: 100 mg/dL — AB
RBC / HPF: >50 RBC/hpf — ABNORMAL HIGH (ref 0–5)
Specific Gravity, Urine: 1.033 — ABNORMAL HIGH (ref 1.005–1.030)
pH: 5 (ref 5.0–8.0)

## 2020-05-11 LAB — RESP PANEL BY RT PCR (RSV, FLU A&B, COVID)
Influenza A by PCR: NEGATIVE
Influenza B by PCR: NEGATIVE
Respiratory Syncytial Virus by PCR: NEGATIVE
SARS Coronavirus 2 by RT PCR: NEGATIVE

## 2020-05-11 LAB — LIPASE, BLOOD: Lipase: 24 U/L (ref 11–51)

## 2020-05-11 LAB — TROPONIN I (HIGH SENSITIVITY)
Troponin I (High Sensitivity): 14 ng/L (ref <18)
Troponin I (High Sensitivity): 16 ng/L (ref <18)

## 2020-05-11 LAB — LACTIC ACID, PLASMA: Lactic Acid, Venous: 2.5 mmol/L (ref 0.5–1.9)

## 2020-05-11 SURGERY — LAPAROTOMY, EXPLORATORY
Anesthesia: General | Site: Abdomen

## 2020-05-11 MED ORDER — SODIUM CHLORIDE 0.9 % IV SOLN
INTRAVENOUS | Status: AC
  Filled 2020-05-11: qty 2

## 2020-05-11 MED ORDER — SUGAMMADEX SODIUM 200 MG/2ML IV SOLN
INTRAVENOUS | Status: DC | PRN
  Administered 2020-05-11: 200 mg via INTRAVENOUS

## 2020-05-11 MED ORDER — ESMOLOL HCL 100 MG/10ML IV SOLN
INTRAVENOUS | Status: DC | PRN
  Administered 2020-05-11: 30 mg via INTRAVENOUS

## 2020-05-11 MED ORDER — MORPHINE SULFATE (PF) 4 MG/ML IV SOLN
4.0000 mg | Freq: Once | INTRAVENOUS | Status: AC
  Administered 2020-05-11: 4 mg via INTRAVENOUS
  Filled 2020-05-11: qty 1

## 2020-05-11 MED ORDER — POVIDONE-IODINE 10 % EX OINT
TOPICAL_OINTMENT | CUTANEOUS | Status: AC
  Filled 2020-05-11: qty 2

## 2020-05-11 MED ORDER — CHLORHEXIDINE GLUCONATE CLOTH 2 % EX PADS: 6.0000 | MEDICATED_PAD | Freq: Once | CUTANEOUS | Status: DC

## 2020-05-11 MED ORDER — KETOROLAC TROMETHAMINE 60 MG/2ML IM SOLN
60.0000 mg | Freq: Once | INTRAMUSCULAR | Status: AC
  Administered 2020-05-11: 60 mg via INTRAMUSCULAR
  Filled 2020-05-11: qty 2

## 2020-05-11 MED ORDER — ONDANSETRON HCL 4 MG/2ML IJ SOLN
4.0000 mg | Freq: Once | INTRAMUSCULAR | Status: AC
  Administered 2020-05-11: 4 mg via INTRAVENOUS
  Filled 2020-05-11: qty 2

## 2020-05-11 MED ORDER — LIDOCAINE 2% (20 MG/ML) 5 ML SYRINGE
INTRAMUSCULAR | Status: AC
  Filled 2020-05-11: qty 5

## 2020-05-11 MED ORDER — ONDANSETRON HCL 4 MG/2ML IJ SOLN: 4.0000 mg | Freq: Once | INTRAMUSCULAR | Status: DC | PRN

## 2020-05-11 MED ORDER — CHLORHEXIDINE GLUCONATE 0.12 % MT SOLN
15.0000 mL | Freq: Once | OROMUCOSAL | Status: AC
  Administered 2020-05-11: 15 mL via OROMUCOSAL

## 2020-05-11 MED ORDER — ONDANSETRON 4 MG PO TBDP: 4.0000 mg | ORAL_TABLET | Freq: Four times a day (QID) | ORAL | Status: DC | PRN

## 2020-05-11 MED ORDER — PHENYLEPHRINE 40 MCG/ML (10ML) SYRINGE FOR IV PUSH (FOR BLOOD PRESSURE SUPPORT)
PREFILLED_SYRINGE | INTRAVENOUS | Status: AC
  Filled 2020-05-11: qty 20

## 2020-05-11 MED ORDER — POVIDONE-IODINE 10 % OINT PACKET
TOPICAL_OINTMENT | CUTANEOUS | Status: DC | PRN
  Administered 2020-05-11: 2 via TOPICAL

## 2020-05-11 MED ORDER — FENTANYL CITRATE (PF) 100 MCG/2ML IJ SOLN
INTRAMUSCULAR | Status: AC
  Filled 2020-05-11: qty 2

## 2020-05-11 MED ORDER — ETOMIDATE 2 MG/ML IV SOLN
INTRAVENOUS | Status: DC | PRN
  Administered 2020-05-11: 16 mg via INTRAVENOUS

## 2020-05-11 MED ORDER — HYDROMORPHONE HCL 1 MG/ML IJ SOLN
1.0000 mg | INTRAMUSCULAR | Status: DC | PRN
  Administered 2020-05-11 – 2020-05-14 (×20): 1 mg via INTRAVENOUS
  Filled 2020-05-11 (×21): qty 1

## 2020-05-11 MED ORDER — SULFAMETHOXAZOLE-TRIMETHOPRIM 800-160 MG PO TABS
1.0000 | ORAL_TABLET | Freq: Once | ORAL | Status: AC
  Administered 2020-05-11: 1 via ORAL
  Filled 2020-05-11: qty 1

## 2020-05-11 MED ORDER — ENOXAPARIN SODIUM 40 MG/0.4ML ~~LOC~~ SOLN
40.0000 mg | SUBCUTANEOUS | Status: DC
  Administered 2020-05-12 – 2020-05-14 (×3): 40 mg via SUBCUTANEOUS
  Filled 2020-05-11 (×3): qty 0.4

## 2020-05-11 MED ORDER — NICOTINE 14 MG/24HR TD PT24
14.0000 mg | MEDICATED_PATCH | Freq: Every day | TRANSDERMAL | Status: DC
  Administered 2020-05-11 – 2020-05-14 (×4): 14 mg via TRANSDERMAL
  Filled 2020-05-11 (×4): qty 1

## 2020-05-11 MED ORDER — CHLORHEXIDINE GLUCONATE CLOTH 2 % EX PADS
6.0000 | MEDICATED_PAD | Freq: Once | CUTANEOUS | Status: DC
  Administered 2020-05-11: 6 via TOPICAL

## 2020-05-11 MED ORDER — MIDAZOLAM HCL 5 MG/5ML IJ SOLN
INTRAMUSCULAR | Status: DC | PRN
  Administered 2020-05-11: 2 mg via INTRAVENOUS

## 2020-05-11 MED ORDER — FENTANYL CITRATE (PF) 100 MCG/2ML IJ SOLN
25.0000 ug | INTRAMUSCULAR | Status: DC | PRN
  Administered 2020-05-11 (×2): 50 ug via INTRAVENOUS

## 2020-05-11 MED ORDER — SODIUM CHLORIDE 0.9 % IV SOLN
2.0000 g | INTRAVENOUS | Status: AC
  Administered 2020-05-11: 2 g via INTRAVENOUS

## 2020-05-11 MED ORDER — ONDANSETRON HCL 4 MG/2ML IJ SOLN: 4.0000 mg | Freq: Four times a day (QID) | INTRAMUSCULAR | Status: DC | PRN

## 2020-05-11 MED ORDER — CARVEDILOL 3.125 MG PO TABS
3.1250 mg | ORAL_TABLET | Freq: Two times a day (BID) | ORAL | Status: DC
  Administered 2020-05-11 – 2020-05-14 (×7): 3.125 mg via ORAL
  Filled 2020-05-11 (×7): qty 1

## 2020-05-11 MED ORDER — PROPOFOL 10 MG/ML IV BOLUS
INTRAVENOUS | Status: AC
  Filled 2020-05-11: qty 20

## 2020-05-11 MED ORDER — PHENYLEPHRINE 40 MCG/ML (10ML) SYRINGE FOR IV PUSH (FOR BLOOD PRESSURE SUPPORT)
PREFILLED_SYRINGE | INTRAVENOUS | Status: AC
  Filled 2020-05-11: qty 10

## 2020-05-11 MED ORDER — KETOROLAC TROMETHAMINE 30 MG/ML IJ SOLN
INTRAMUSCULAR | Status: AC
  Filled 2020-05-11: qty 1

## 2020-05-11 MED ORDER — BUPIVACAINE LIPOSOME 1.3 % IJ SUSP
INTRAMUSCULAR | Status: AC
  Filled 2020-05-11: qty 20

## 2020-05-11 MED ORDER — ESMOLOL HCL 100 MG/10ML IV SOLN
INTRAVENOUS | Status: AC
  Filled 2020-05-11: qty 10

## 2020-05-11 MED ORDER — SODIUM CHLORIDE 0.9 % IV SOLN: INTRAVENOUS | Status: DC

## 2020-05-11 MED ORDER — SODIUM CHLORIDE FLUSH 0.9 % IV SOLN
INTRAVENOUS | Status: AC
  Filled 2020-05-11: qty 10

## 2020-05-11 MED ORDER — KETOROLAC TROMETHAMINE 30 MG/ML IJ SOLN
30.0000 mg | Freq: Four times a day (QID) | INTRAMUSCULAR | Status: DC | PRN
  Administered 2020-05-13 – 2020-05-15 (×2): 30 mg via INTRAVENOUS
  Filled 2020-05-11 (×3): qty 1

## 2020-05-11 MED ORDER — MIDAZOLAM HCL 2 MG/2ML IJ SOLN
INTRAMUSCULAR | Status: AC
  Filled 2020-05-11: qty 2

## 2020-05-11 MED ORDER — SIMETHICONE 80 MG PO CHEW: 40.0000 mg | CHEWABLE_TABLET | Freq: Four times a day (QID) | ORAL | Status: DC | PRN

## 2020-05-11 MED ORDER — ORAL CARE MOUTH RINSE: 15.0000 mL | Freq: Once | OROMUCOSAL | Status: AC

## 2020-05-11 MED ORDER — SUCCINYLCHOLINE CHLORIDE 20 MG/ML IJ SOLN
INTRAMUSCULAR | Status: DC | PRN
  Administered 2020-05-11: 60 mg via INTRAVENOUS
  Administered 2020-05-11: 140 mg via INTRAVENOUS

## 2020-05-11 MED ORDER — HYDROCODONE-ACETAMINOPHEN 5-325 MG PO TABS: 1.0000 | ORAL_TABLET | Freq: Four times a day (QID) | ORAL | 0 refills | Status: DC | PRN

## 2020-05-11 MED ORDER — PANTOPRAZOLE SODIUM 40 MG IV SOLR
40.0000 mg | Freq: Every day | INTRAVENOUS | Status: DC
  Administered 2020-05-11 – 2020-05-12 (×2): 40 mg via INTRAVENOUS
  Filled 2020-05-11 (×2): qty 40

## 2020-05-11 MED ORDER — LORAZEPAM 2 MG/ML IJ SOLN
1.0000 mg | INTRAMUSCULAR | Status: DC | PRN
  Administered 2020-05-11 – 2020-05-15 (×7): 1 mg via INTRAVENOUS
  Filled 2020-05-11 (×7): qty 1

## 2020-05-11 MED ORDER — LIDOCAINE HCL (CARDIAC) PF 50 MG/5ML IV SOSY
PREFILLED_SYRINGE | INTRAVENOUS | Status: DC | PRN
  Administered 2020-05-11: 60 mg via INTRAVENOUS

## 2020-05-11 MED ORDER — ACETAMINOPHEN 325 MG PO TABS
650.0000 mg | ORAL_TABLET | Freq: Four times a day (QID) | ORAL | Status: DC | PRN
  Administered 2020-05-13: 650 mg via ORAL
  Filled 2020-05-11: qty 2

## 2020-05-11 MED ORDER — PROPOFOL 10 MG/ML IV BOLUS
INTRAVENOUS | Status: DC | PRN
  Administered 2020-05-11: 40 mg via INTRAVENOUS

## 2020-05-11 MED ORDER — LACTATED RINGERS IV SOLN: INTRAVENOUS | Status: DC

## 2020-05-11 MED ORDER — NITROGLYCERIN 0.4 MG SL SUBL: 0.4000 mg | SUBLINGUAL_TABLET | SUBLINGUAL | Status: DC | PRN

## 2020-05-11 MED ORDER — OXYCODONE HCL 5 MG PO TABS
5.0000 mg | ORAL_TABLET | ORAL | Status: DC | PRN
  Administered 2020-05-11: 10 mg via ORAL
  Administered 2020-05-12: 5 mg via ORAL
  Filled 2020-05-11: qty 1
  Filled 2020-05-11: qty 2

## 2020-05-11 MED ORDER — FENTANYL CITRATE (PF) 100 MCG/2ML IJ SOLN
25.0000 ug | INTRAMUSCULAR | Status: AC | PRN
  Administered 2020-05-11 (×5): 50 ug via INTRAVENOUS
  Administered 2020-05-11: 100 ug via INTRAVENOUS

## 2020-05-11 MED ORDER — BUPIVACAINE LIPOSOME 1.3 % IJ SUSP
INTRAMUSCULAR | Status: DC | PRN
  Administered 2020-05-11: 20 mL

## 2020-05-11 MED ORDER — FENTANYL CITRATE (PF) 250 MCG/5ML IJ SOLN
INTRAMUSCULAR | Status: AC
  Filled 2020-05-11: qty 5

## 2020-05-11 MED ORDER — ONDANSETRON HCL 4 MG PO TABS: 4.0000 mg | ORAL_TABLET | Freq: Three times a day (TID) | ORAL | 0 refills | Status: DC | PRN

## 2020-05-11 MED ORDER — KETOROLAC TROMETHAMINE 30 MG/ML IJ SOLN
30.0000 mg | Freq: Four times a day (QID) | INTRAMUSCULAR | Status: AC
  Administered 2020-05-11: 30 mg via INTRAVENOUS

## 2020-05-11 MED ORDER — IOHEXOL 350 MG/ML SOLN
80.0000 mL | Freq: Once | INTRAVENOUS | Status: AC | PRN
  Administered 2020-05-11: 80 mL via INTRAVENOUS

## 2020-05-11 MED ORDER — SODIUM CHLORIDE 0.9 % IR SOLN
Status: DC | PRN
  Administered 2020-05-11: 2000 mL
  Administered 2020-05-11: 1000 mL

## 2020-05-11 MED ORDER — ACETAMINOPHEN 650 MG RE SUPP: 650.0000 mg | Freq: Four times a day (QID) | RECTAL | Status: DC | PRN

## 2020-05-11 MED ORDER — ONDANSETRON 4 MG PO TBDP
4.0000 mg | ORAL_TABLET | Freq: Once | ORAL | Status: AC
  Administered 2020-05-11: 4 mg via ORAL
  Filled 2020-05-11: qty 1

## 2020-05-11 MED ORDER — PHENYLEPHRINE HCL (PRESSORS) 10 MG/ML IV SOLN
INTRAVENOUS | Status: DC | PRN
  Administered 2020-05-11: 100 ug via INTRAVENOUS
  Administered 2020-05-11: 200 ug via INTRAVENOUS
  Administered 2020-05-11 (×2): 100 ug via INTRAVENOUS
  Administered 2020-05-11: 300 ug via INTRAVENOUS
  Administered 2020-05-11: 100 ug via INTRAVENOUS
  Administered 2020-05-11: 200 ug via INTRAVENOUS
  Administered 2020-05-11 (×3): 100 ug via INTRAVENOUS

## 2020-05-11 MED ORDER — SODIUM CHLORIDE 0.9 % IV BOLUS
1000.0000 mL | Freq: Once | INTRAVENOUS | Status: AC
  Administered 2020-05-11: 1000 mL via INTRAVENOUS

## 2020-05-11 MED ORDER — MEPERIDINE HCL 50 MG/ML IJ SOLN: 6.2500 mg | INTRAMUSCULAR | Status: DC | PRN

## 2020-05-11 SURGICAL SUPPLY — 57 items
APL PRP STRL LF DISP 70% ISPRP (MISCELLANEOUS) ×2
APPLIER CLIP 11 MED OPEN (CLIP)
APPLIER CLIP 13 LRG OPEN (CLIP)
APR CLP LRG 13 20 CLIP (CLIP)
APR CLP MED 11 20 MLT OPN (CLIP)
BARRIER SKIN 2 3/4 (OSTOMY) IMPLANT
BARRIER SKIN 2 3/4 INCH (OSTOMY)
BRR SKN FLT 2.75X2.25 2 PC (OSTOMY)
CHLORAPREP W/TINT 26 (MISCELLANEOUS) ×4 IMPLANT
CLAMP POUCH DRAINAGE QUIET (OSTOMY) IMPLANT
CLIP APPLIE 11 MED OPEN (CLIP) IMPLANT
CLIP APPLIE 13 LRG OPEN (CLIP) IMPLANT
CLOTH BEACON ORANGE TIMEOUT ST (SAFETY) ×4 IMPLANT
COVER LIGHT HANDLE STERIS (MISCELLANEOUS) ×8 IMPLANT
COVER WAND RF STERILE (DRAPES) ×4 IMPLANT
DRAPE WARM FLUID 44X44 (DRAPES) ×4 IMPLANT
DRSG OPSITE POSTOP 4X10 (GAUZE/BANDAGES/DRESSINGS) ×4 IMPLANT
ELECT BLADE 6 FLAT ULTRCLN (ELECTRODE) IMPLANT
ELECT REM PT RETURN 9FT ADLT (ELECTROSURGICAL) ×4
ELECTRODE REM PT RTRN 9FT ADLT (ELECTROSURGICAL) ×2 IMPLANT
GLOVE BIO SURGEON STRL SZ7 (GLOVE) ×4 IMPLANT
GLOVE BIOGEL PI IND STRL 7.0 (GLOVE) ×4 IMPLANT
GLOVE BIOGEL PI INDICATOR 7.0 (GLOVE) ×4
GLOVE SURG SS PI 7.5 STRL IVOR (GLOVE) ×4 IMPLANT
GOWN STRL REUS W/TWL LRG LVL3 (GOWN DISPOSABLE) ×8 IMPLANT
HANDLE SUCTION POOLE (INSTRUMENTS) ×4 IMPLANT
INST SET MAJOR GENERAL (KITS) ×4 IMPLANT
KIT ARTERIAL LINE 20GX12CM (SET/KITS/TRAYS/PACK) ×4 IMPLANT
KIT TURNOVER KIT A (KITS) ×4 IMPLANT
LIGASURE IMPACT 36 18CM CVD LR (INSTRUMENTS) ×4 IMPLANT
MANIFOLD NEPTUNE II (INSTRUMENTS) ×4 IMPLANT
NEEDLE HYPO 18GX1.5 BLUNT FILL (NEEDLE) ×4 IMPLANT
NEEDLE HYPO 21X1.5 SAFETY (NEEDLE) ×4 IMPLANT
NS IRRIG 1000ML POUR BTL (IV SOLUTION) ×12 IMPLANT
PACK ABDOMINAL MAJOR (CUSTOM PROCEDURE TRAY) ×4 IMPLANT
PAD ARMBOARD 7.5X6 YLW CONV (MISCELLANEOUS) ×4 IMPLANT
POUCH OSTOMY 2 3/4  H 3804 (WOUND CARE)
POUCH OSTOMY 2 3/4 H 3804 (WOUND CARE)
POUCH OSTOMY 2 PC DRNBL 2.75 (WOUND CARE) IMPLANT
RELOAD PROXIMATE 75MM BLUE (ENDOMECHANICALS) ×8 IMPLANT
RETRACTOR WND ALEXIS 25 LRG (MISCELLANEOUS) ×2 IMPLANT
RTRCTR WOUND ALEXIS 25CM LRG (MISCELLANEOUS) ×4
SET BASIN LINEN APH (SET/KITS/TRAYS/PACK) ×4 IMPLANT
SPONGE LAP 18X18 RF (DISPOSABLE) ×4 IMPLANT
STAPLER GUN LINEAR PROX 60 (STAPLE) ×4 IMPLANT
STAPLER PROXIMATE 75MM BLUE (STAPLE) ×4 IMPLANT
STAPLER VISISTAT (STAPLE) ×4 IMPLANT
SUCTION POOLE HANDLE (INSTRUMENTS) ×8
SUT CHROMIC 0 SH (SUTURE) IMPLANT
SUT CHROMIC 2 0 SH (SUTURE) ×4 IMPLANT
SUT CHROMIC 3 0 SH 27 (SUTURE) IMPLANT
SUT PDS AB 0 CTX 60 (SUTURE) ×8 IMPLANT
SUT SILK 2 0 (SUTURE)
SUT SILK 2-0 18XBRD TIE 12 (SUTURE) IMPLANT
SUT SILK 3 0 SH CR/8 (SUTURE) ×4 IMPLANT
SYR 20ML LL LF (SYRINGE) ×8 IMPLANT
TRAY FOLEY MTR SLVR 16FR STAT (SET/KITS/TRAYS/PACK) ×4 IMPLANT

## 2020-05-11 NOTE — Anesthesia Procedure Notes (Signed)
Procedure Name: Intubation Performed by: Carolyn Maniscalco L, CRNA Pre-anesthesia Checklist: Patient identified, Emergency Drugs available, Suction available, Patient being monitored and Timeout performed Patient Re-evaluated:Patient Re-evaluated prior to induction Oxygen Delivery Method: Circle system utilized Preoxygenation: Pre-oxygenation with 100% oxygen Induction Type: IV induction Laryngoscope Size: Miller and 2 Grade View: Grade II Tube size: 7.5 mm Number of attempts: 1 Airway Equipment and Method: Stylet Placement Confirmation: ETT inserted through vocal cords under direct vision,  positive ETCO2,  CO2 detector and breath sounds checked- equal and bilateral Secured at: 22 cm Tube secured with: Tape Dental Injury: Teeth and Oropharynx as per pre-operative assessment        

## 2020-05-11 NOTE — Transfer of Care (Signed)
Immediate Anesthesia Transfer of Care Note  Patient: Brian Ford  Procedure(s) Performed: EXPLORATORY LAPAROTOMY (N/A ) PARTIAL SMALL BOWEL RESECTION (Abdomen)  Patient Location: PACU  Anesthesia Type:General  Level of Consciousness: awake, alert , oriented and patient cooperative  Airway & Oxygen Therapy: Patient Spontanous Breathing and Patient connected to face mask oxygen  Post-op Assessment: Report given to RN, Post -op Vital signs reviewed and stable and Patient moving all extremities  Post vital signs: Reviewed and stable  Last Vitals:  Vitals Value Taken Time  BP 148/95 05/11/20 1448  Temp    Pulse 103 05/11/20 1449  Resp 19 05/11/20 1449  SpO2 100 % 05/11/20 1449  Vitals shown include unvalidated device data.  Last Pain:  Vitals:   05/11/20 1305  TempSrc: Oral  PainSc: 8       Patients Stated Pain Goal: 5 (05/11/20 1305)  Complications: No complications documented.

## 2020-05-11 NOTE — Progress Notes (Signed)
Patient transferred to ICU 9 at 1700. Patient tolerated transfer well. Reported pain of 7/10 and was given 10 mg of oxycodone per order. Patient is resting comfortably. Will continue to monitor.

## 2020-05-11 NOTE — Anesthesia Procedure Notes (Signed)
Arterial Line Insertion Performed by: Windell Norfolk, MD, anesthesiologist  Patient sedated Right, radial was placed Catheter size: 20 G Hand hygiene performed , maximum sterile barriers used  and Seldinger technique used Allen's test indicative of satisfactory collateral circulation Attempts: 1 Procedure performed without using ultrasound guided technique. Following insertion, dressing applied. Post procedure assessment: normal  Patient tolerated the procedure well with no immediate complications.

## 2020-05-11 NOTE — ED Notes (Signed)
Removed NG tube due to kink per xray result. Attempted to reinsert x 2 pt unable to tolerate. Reports multiple nose fxs in the past. MD notified

## 2020-05-11 NOTE — ED Provider Notes (Signed)
[facility] EMERGENCY DEPARTMENT Provider Note   CSN: [id number] Arrival date & time: 05/10/20  1824     History Chief Complaint  Patient presents with  . Abdominal Pain    Brian Ford is a 54 y.o. male.  Patient seen earlier today in the morning.  May have presented a little bit earlier and left AMA.  But certainly with the morning presentation he left early afternoon AMA before all lab results were back.  Patient did have labs done no urinalysis did have CT scan of the abdomen done.  Which shows some enteritis but no evidence of any bowel obstruction.  Patient's complaint is that he has had back pain is also had some abdominal pain and about 3 episodes of vomiting no vomiting of blood.  No diarrhea.  He also was concerned about infections from from piercings that he had done to his umbilicus and to the penis area.  Patient also clinically concerned that he has a urinary tract infection because he says it feels like that is the way its been in the past.  No chest pain no shortness of breath.        Past Medical History:  Diagnosis Date  . Anxiety   . CAD (coronary artery disease) 2012   a. PCI of 100% RCA (2012) b. s/p BMS to LAD c. 02/2018: Anterior STEMI with DES to LAD; CTO of RCA with collaterals present.   . Crack cocaine use   . Depression   . GERD (gastroesophageal reflux disease)   . Hyperlipidemia   . Hypertension   . Normocytic anemia   . ST elevation myocardial infarction (STEMI) of inferior wall [facility])     Patient Active Problem List   Diagnosis Date Noted  . Abnormal CT of the chest 07/22/2017  . Pulmonary nodules 07/22/2017  . Pulmonary emphysema (HCC) 07/22/2017  . Aortic atherosclerosis (HCC) 07/22/2017  . Coronary artery calcification 07/22/2017  . Cigarette nicotine dependence with nicotine-induced disorder 07/22/2017  . Presence of Bare Metal stent in LAD coronary artery 05/20/2014  . Hyperlipidemia   . Normocytic anemia   . CAD (coronary artery  disease)   . Acute coronary syndrome (HCC) 05/19/2014  . MI, old 05/19/2014  . Cocaine abuse (HCC) 05/19/2014  . Tobacco abuse 05/19/2014  . ST elevation myocardial infarction involving left anterior descending (LAD) coronary artery (HCC) 05/19/2014  . CAD S/P percutaneous coronary angioplasty 12/25/2010    Past Surgical History:  Procedure Laterality Date  . CORONARY ANGIOPLASTY    . CORONARY/GRAFT ACUTE MI REVASCULARIZATION N/A 03/03/2018   Procedure: Coronary/Graft Acute MI Revascularization;  Surgeon: Tonny Bollman, MD;  Location[facility] INVASIVE CV LAB;  Service: Cardiovascular;  Laterality: N/A;  . LEFT HEART CATH AND CORONARY ANGIOGRAPHY N/A 03/03/2018   Procedure: LEFT HEART CATH AND CORONARY ANGIOGRAPHY;  Surgeon: Tonny Bollman, MD;  Location: [facility] INVASIVE CV LAB;  Service: Cardiovascular;  Laterality: N/A;  . LEFT HEART CATHETERIZATION WITH CORONARY ANGIOGRAM N/A 05/19/2014   Procedure: LEFT HEART CATHETERIZATION WITH CORONARY ANGIOGRAM;  Surgeon: Marykay Lex, MD;  Location: [facility] CATH LAB;  Service: Cardiovascular;  Laterality: N/A;  . NOSE SURGERY    . PERCUTANEOUS CORONARY STENT INTERVENTION (PCI-S)  05/19/2014   Procedure: PERCUTANEOUS CORONARY STENT INTERVENTION (PCI-S);  Surgeon: Marykay Lex, MD;  Location: [facility] CATH LAB;  Service: Cardiovascular;;  BMS to Mid LAD       Family History  Problem Relation Age of Onset  . Hypertension Mother   . Heart disease  Mother   . Stroke Mother   . Diabetes Sister   . Diabetes Brother     Social History   Tobacco Use  . Smoking status: Current Every Day Smoker    Packs/day: 1.00    Years: 35.00    Pack years: 35.00    Types: Cigarettes  . Smokeless tobacco: Never Used  Vaping Use  . Vaping Use: Never used  Substance Use Topics  . Alcohol use: No  . Drug use: Yes    Types: "Crack" cocaine, Oxycodone, Benzodiazepines, Cocaine, Marijuana    Home Medications Prior to Admission medications   Medication Sig Start Date  End Date Taking? Authorizing Provider  nitroGLYCERIN (NITROSTAT) 0.4 MG SL tablet Place 1 tablet (0.4 mg total) under the tongue every 5 (five) minutes x 3 doses as needed for chest pain. 03/12/18  Yes Strader, Lennart Pall, PA-C  aspirin EC 81 MG tablet Take 1 tablet (81 mg total) by mouth daily. Patient not taking: Reported on 05/10/2020 03/04/18   Azalee Course, PA  atorvastatin (LIPITOR) 80 MG tablet Take 1 tablet (80 mg total) by mouth daily at 6 PM. Patient not taking: Reported on 05/10/2020 08/27/19   Jonelle Sidle, MD  carvedilol (COREG) 3.125 MG tablet Take 1 tablet (3.125 mg total) by mouth 2 (two) times daily. Patient not taking: Reported on 05/10/2020 08/27/19 08/26/20  Jonelle Sidle, MD  HYDROcodone-acetaminophen (NORCO/VICODIN) 5-325 MG tablet Take 1 tablet by mouth every 6 (six) hours as needed for moderate pain. 05/11/20   Vanetta Mulders, MD  ondansetron (ZOFRAN) 4 MG tablet Take 1 tablet (4 mg total) by mouth every 8 (eight) hours as needed for nausea or vomiting. 05/11/20   Vanetta Mulders, MD  ticagrelor (BRILINTA) 90 MG TABS tablet Take 1 tablet (90 mg total) by mouth 2 (two) times daily. Patient not taking: Reported on 05/10/2020 03/12/18   Ellsworth Lennox, PA-C    Allergies    Patient has no known allergies.  Review of Systems   Review of Systems  Constitutional: Negative for chills and fever.  HENT: Negative for congestion, rhinorrhea and sore throat.   Eyes: Negative for visual disturbance.  Respiratory: Negative for cough and shortness of breath.   Cardiovascular: Negative for chest pain and leg swelling.  Gastrointestinal: Positive for abdominal pain, nausea and vomiting. Negative for blood in stool and diarrhea.  Genitourinary: Positive for difficulty urinating and dysuria.  Musculoskeletal: Positive for back pain. Negative for neck pain.  Skin: Negative for rash.  Neurological: Negative for dizziness, light-headedness and headaches.  Hematological: Does not  bruise/bleed easily.  Psychiatric/Behavioral: Negative for confusion.    Physical Exam Updated Vital Signs BP (!) 156/82 (BP Location: Left Arm)   Pulse 74   Temp 98.3 F (36.8 C) (Oral)   Resp 15   Ht 1.727 m (5\' 8" )   Wt 68 kg   SpO2 98%   BMI 22.79 kg/m   Physical Exam Vitals and nursing note reviewed.  Constitutional:      General: He is not in acute distress.    Appearance: Normal appearance. He is well-developed.  HENT:     Head: Normocephalic and atraumatic.  Eyes:     Extraocular Movements: Extraocular movements intact.     Conjunctiva/sclera: Conjunctivae normal.     Pupils: Pupils are equal, round, and reactive to light.  Cardiovascular:     Rate and Rhythm: Normal rate and regular rhythm.     Heart sounds: No murmur heard.   Pulmonary:  Effort: Pulmonary effort is normal. No respiratory distress.     Breath sounds: Normal breath sounds.  Abdominal:     General: There is no distension.     Palpations: Abdomen is soft.     Tenderness: There is no abdominal tenderness. There is no guarding.     Hernia: No hernia is present.     Comments: No evidence of any infection around the periumbilical area.  If there was a piercing in the past.  It is healed over.  No piercing present.  Genitourinary:    Penis: Normal.      Comments: No evidence of any infection to the penis groin area or scrotal area no swelling no testicular tenderness.  No discharge.  Circumcised. Musculoskeletal:        General: No swelling. Normal range of motion.     Cervical back: Normal range of motion and neck supple.  Skin:    General: Skin is warm and dry.     Capillary Refill: Capillary refill takes less than 2 seconds.  Neurological:     General: No focal deficit present.     Mental Status: He is alert and oriented to person, place, and time.     Cranial Nerves: No cranial nerve deficit.     Sensory: No sensory deficit.     ED Results / Procedures / Treatments   Labs (all labs  ordered are listed, but only abnormal results are displayed) Labs Reviewed  URINALYSIS, ROUTINE W REFLEX MICROSCOPIC - Abnormal; Notable for the following components:      Result Value   Specific Gravity, Urine >1.046 (*)    Glucose, UA 50 (*)    Hgb urine dipstick SMALL (*)    Ketones, ur 20 (*)    Protein, ur 30 (*)    All other components within normal limits  URINE CULTURE    EKG None  Radiology CT Abdomen Pelvis W Contrast  Result Date: 05/10/2020 CLINICAL DATA:  Abdominal pain and vomiting. EXAM: CT ABDOMEN AND PELVIS WITH CONTRAST TECHNIQUE: Multidetector CT imaging of the abdomen and pelvis was performed using the standard protocol following bolus administration of intravenous contrast. CONTRAST:  OMNIPAQUE IOHEXOL 300 MG/ML  SOLN COMPARISON:  None. FINDINGS: Lower chest: No acute abnormality. Hepatobiliary: No focal liver abnormality is seen. No gallstones, gallbladder wall thickening, or biliary dilatation. Pancreas: Unremarkable. No pancreatic ductal dilatation or surrounding inflammatory changes. Spleen: Normal in size without focal abnormality. Adrenals/Urinary Tract: Adrenal glands are unremarkable. Kidneys are normal, without renal calculi, focal lesion, or hydronephrosis. Bladder is unremarkable for the degree of distention. Stomach/Bowel: Stomach is within normal limits. There are few loops of non-dilated fluid-filled small bowel in the right upper abdomen with trace interloop ascites. No obstruction. Normal appendix. Vascular/Lymphatic: Aortic atherosclerosis. No enlarged abdominal or pelvic lymph nodes. Reproductive: Prostate is unremarkable. Other: No abdominal wall hernia or abnormality. Trace free fluid in the pelvis. No pneumoperitoneum. Musculoskeletal: No acute or significant osseous findings. IMPRESSION: 1. Non-dilated fluid-filled small bowel in the right upper abdomen with trace ascites, suggestive of enteritis. No obstruction. 2. Aortic Atherosclerosis  (ICD10-I70.0). Electronically Signed   By: Obie Dredge M.D.   On: 05/10/2020 12:52    Procedures Procedures (including critical care time)  Medications Ordered in ED Medications  0.9 %  sodium chloride infusion (has no administration in time range)  sodium chloride 0.9 % bolus 1,000 mL (1,000 mLs Intravenous New Bag/Given 05/10/20 2300)  ondansetron (ZOFRAN) injection 4 mg (4 mg Intravenous Given 05/10/20  2300)  HYDROmorphone (DILAUDID) injection 1 mg (1 mg Intravenous Given 05/10/20 2301)    ED Course  I have reviewed the triage vital signs and the nursing notes.  Pertinent labs & imaging results that were available during my care of the patient were reviewed by me and considered in my medical decision making (see chart for details).    MDM Rules/Calculators/A&P                          Patient's labs from earlier today without any significant abnormalities.  CT scan showed some enteritis.  But no evidence of any bowel obstruction.  No colitis.  All of may be suggestive of some viral gastroenteritis based by his history.  Urinalysis was obtained here.  Urine was very concentrated.  Goes along with patient's 3 episodes of vomiting.  Patient did receive 1 L of fluid and some IV maintenance fluid.  Overall patient feeling much better.  Urinalysis not consistent with urinary tract infection.  But sent for urine culture.  Patient treated here with Zofran and hydromorphone with significant improvement on top of the IV fluids.  Patient will be discharged home with a prescription for Zofran and a prepack of hydrocodone.  Patient aware that if symptoms become recurrent may need to see GI medicine to make sure is not an inflammatory process right now were thinking it may be more of a viral process.      Final Clinical Impression(s) / ED Diagnoses Final diagnoses:  Gastroenteritis    Rx / DC Orders ED Discharge Orders         Ordered    ondansetron (ZOFRAN) 4 MG tablet  Every 8 hours  PRN        05/11/20 0002    HYDROcodone-acetaminophen (NORCO/VICODIN) 5-325 MG tablet  Every 6 hours PRN        05/11/20 0002           Vanetta Mulders, MD 05/11/20 0015

## 2020-05-11 NOTE — Discharge Instructions (Signed)
Take the pain medication as directed.  Take the antinausea medicine as directed.  Prepack of the pain medication given to you here tonight.  As mentioned CT scan showed some inflammation in the small intestines.  Otherwise no acute abnormalities.  Urinalysis was not consistent with urinary tract infection.  But has been sent for culture.  Return for any new or worse symptoms.

## 2020-05-11 NOTE — Consult Note (Signed)
Reason for Consult: Small bowel obstruction Referring Physician: Dr. Wendie Agreste is an 54 y.o. male.  HPI: Patient is a 54 year old white male who was seen in the emergency room yesterday for abdominal and back pain who left AGAINST MEDICAL ADVICE yesterday who presented to emergency room again today with worsening abdominal and back pain.  A CT scan of the abdomen was performed which revealed possible internal hernia of the small intestine with evidence of ischemic changes.  Patient states he has had intermittent abdominal pain over the past few months with a decreased appetite and weight loss.  He is cocaine positive and did smoke cocaine yesterday.  He denies any history of abdominal surgery.  His pain is somewhat eased up with pain medication.  Past Medical History:  Diagnosis Date  . Anxiety   . CAD (coronary artery disease) 2012   a. PCI of 100% RCA (2012) b. s/p BMS to LAD c. 02/2018: Anterior STEMI with DES to LAD; CTO of RCA with collaterals present.   . Crack cocaine use   . Depression   . GERD (gastroesophageal reflux disease)   . Hyperlipidemia   . Hypertension   . Normocytic anemia   . ST elevation myocardial infarction (STEMI) of inferior wall [facility])     Past Surgical History:  Procedure Laterality Date  . CORONARY ANGIOPLASTY    . CORONARY/GRAFT ACUTE MI REVASCULARIZATION N/A 03/03/2018   Procedure: Coronary/Graft Acute MI Revascularization;  Surgeon: Tonny Bollman, MD;  Location: [facility] INVASIVE CV LAB;  Service: Cardiovascular;  Laterality: N/A;  . LEFT HEART CATH AND CORONARY ANGIOGRAPHY N/A 03/03/2018   Procedure: LEFT HEART CATH AND CORONARY ANGIOGRAPHY;  Surgeon: Tonny Bollman, MD;  Location: [facility] INVASIVE CV LAB;  Service: Cardiovascular;  Laterality: N/A;  . LEFT HEART CATHETERIZATION WITH CORONARY ANGIOGRAM N/A 05/19/2014   Procedure: LEFT HEART CATHETERIZATION WITH CORONARY ANGIOGRAM;  Surgeon: Marykay Lex, MD;  Location: [facility] CATH LAB;  Service:  Cardiovascular;  Laterality: N/A;  . NOSE SURGERY    . PERCUTANEOUS CORONARY STENT INTERVENTION (PCI-S)  05/19/2014   Procedure: PERCUTANEOUS CORONARY STENT INTERVENTION (PCI-S);  Surgeon: Marykay Lex, MD;  Location: [facility] CATH LAB;  Service: Cardiovascular;;  BMS to Mid LAD    Family History  Problem Relation Age of Onset  . Hypertension Mother   . Heart disease Mother   . Stroke Mother   . Diabetes Sister   . Diabetes Brother     Social History:  reports that he has been smoking cigarettes. He has a 35.00 pack-year smoking history. He has never used smokeless tobacco. He reports current drug use. Drugs: "Crack" cocaine, Oxycodone, Benzodiazepines, Cocaine, and Marijuana. He reports that he does not drink alcohol.  Allergies: No Known Allergies  Medications: I have reviewed the patient's current medications.  Results for orders placed or performed during the hospital encounter of 05/11/20 (from the past 48 hour(s))  Rapid urine drug screen (hospital performed)     Status: Abnormal   Collection Time: 05/11/20  9:26 AM  Result Value Ref Range   Opiates POSITIVE (A) NONE DETECTED   Cocaine POSITIVE (A) NONE DETECTED   Benzodiazepines NONE DETECTED NONE DETECTED   Amphetamines NONE DETECTED NONE DETECTED   Tetrahydrocannabinol NONE DETECTED NONE DETECTED   Barbiturates NONE DETECTED NONE DETECTED    Comment: (NOTE) DRUG SCREEN FOR MEDICAL PURPOSES ONLY.  IF CONFIRMATION IS NEEDED FOR ANY PURPOSE, NOTIFY LAB WITHIN 5 DAYS.  LOWEST DETECTABLE LIMITS FOR URINE DRUG SCREEN Drug Class  Cutoff (ng/mL) Amphetamine and metabolites    1000 Barbiturate and metabolites    200 Benzodiazepine                 200 Tricyclics and metabolites     300 Opiates and metabolites        300 Cocaine and metabolites        300 THC                            50 Performed at [facility], [address], [place], [place] [address]   Urinalysis, Routine w reflex microscopic  Urine, Clean Catch     Status: Abnormal   Collection Time: 05/11/20  9:27 AM  Result Value Ref Range   Color, Urine AMBER (A) YELLOW    Comment: BIOCHEMICALS MAY BE AFFECTED BY COLOR   APPearance HAZY (A) CLEAR   Specific Gravity, Urine 1.033 (H) 1.005 - 1.030   pH 5.0 5.0 - 8.0   Glucose, UA 50 (A) NEGATIVE mg/dL   Hgb urine dipstick LARGE (A) NEGATIVE   Bilirubin Urine NEGATIVE NEGATIVE   Ketones, ur 5 (A) NEGATIVE mg/dL   Protein, ur 607 (A) NEGATIVE mg/dL   Nitrite POSITIVE (A) NEGATIVE   Leukocytes,Ua NEGATIVE NEGATIVE   RBC / HPF >50 (H) 0 - 5 RBC/hpf   WBC, UA 6-10 0 - 5 WBC/hpf   Bacteria, UA NONE SEEN NONE SEEN   Mucus PRESENT    Hyaline Casts, UA PRESENT     Comment: Performed at [facility], [address] [place], [place] [address]  CBC with Differential/Platelet     Status: Abnormal   Collection Time: 05/11/20  9:45 AM  Result Value Ref Range   WBC 20.6 (H) 4.0 - 10.5 K/uL   RBC 5.77 4.22 - 5.81 MIL/uL   Hemoglobin 16.2 13.0 - 17.0 g/dL   HCT 26.9 39 - 52 %   MCV 84.4 80.0 - 100.0 fL   MCH 28.1 26.0 - 34.0 pg   MCHC 33.3 30.0 - 36.0 g/dL   RDW 48.5 46.2 - 70.3 %   Platelets 326 150 - 400 K/uL   nRBC 0.0 0.0 - 0.2 %   Neutrophils Relative % 88 %   Neutro Abs 18.4 (H) 1.7 - 7.7 K/uL   Lymphocytes Relative 7 %   Lymphs Abs 1.3 0.7 - 4.0 K/uL   Monocytes Relative 4 %   Monocytes Absolute 0.7 0.1 - 1.0 K/uL   Eosinophils Relative 0 %   Eosinophils Absolute 0.0 0 - 0 K/uL   Basophils Relative 0 %   Basophils Absolute 0.1 0 - 0 K/uL   Immature Granulocytes 1 %   Abs Immature Granulocytes 0.12 (H) 0.00 - 0.07 K/uL    Comment: Performed at [facility], [address], [place], [place] [address]  Comprehensive metabolic panel     Status: Abnormal   Collection Time: 05/11/20  9:45 AM  Result Value Ref Range   Sodium 138 135 - 145 mmol/L   Potassium 3.5 3.5 - 5.1 mmol/L    Comment: DELTA CHECK NOTED   Chloride 102 98 - 111 mmol/L   CO2 24 22 - 32 mmol/L    Glucose, Bld 156 (H) 70 - 99 mg/dL    Comment: Glucose reference range applies only to samples taken after fasting for at least 8 hours.   BUN 19 6 - 20 mg/dL   Creatinine, Ser 8.18 0.61 - 1.24 mg/dL  Calcium 9.2 8.9 - 10.3 mg/dL   Total Protein 7.0 6.5 - 8.1 g/dL   Albumin 3.9 3.5 - 5.0 g/dL   AST 21 15 - 41 U/L   ALT 15 0 - 44 U/L   Alkaline Phosphatase 69 38 - 126 U/L   Total Bilirubin 1.0 0.3 - 1.2 mg/dL   GFR calc non Af Amer >60 >60 mL/min   Anion gap 12 5 - 15    Comment: Performed at [facility], [address], [place], [place] [address]  Lipase, blood     Status: None   Collection Time: 05/11/20  9:45 AM  Result Value Ref Range   Lipase 24 11 - 51 U/L    Comment: Performed at [facility], [address] [place], [place] [address]  Troponin I (High Sensitivity)     Status: None   Collection Time: 05/11/20  9:45 AM  Result Value Ref Range   Troponin I (High Sensitivity) 14 <18 ng/L    Comment: (NOTE) Elevated high sensitivity troponin I (hsTnI) values and significant  changes across serial measurements may suggest ACS but many other  chronic and acute conditions are known to elevate hsTnI results.  Refer to the Links section for chest pain algorithms and additional  guidance. Performed at [facility], [address], [place], [place] [address]   Lactic acid, plasma     Status: Abnormal   Collection Time: 05/11/20  9:45 AM  Result Value Ref Range   Lactic Acid, Venous 2.5 (HH) 0.5 - 1.9 mmol/L    Comment: CRITICAL RESULT CALLED TO, READ BACK BY AND VERIFIED WITH: JENNIFER KENDRICK @1047  05/11/20 BY JONEST Performed at [facility], [address] [place], [place]     CT Abdomen Pelvis W Contrast  Result Date: 05/10/2020 CLINICAL DATA:  Abdominal pain and vomiting. EXAM: CT ABDOMEN AND PELVIS WITH CONTRAST TECHNIQUE: Multidetector CT imaging of the abdomen and pelvis was performed using the standard protocol following bolus administration of intravenous  contrast. CONTRAST:  07/10/2020 OMNIPAQUE IOHEXOL 300 MG/ML  SOLN COMPARISON:  None. FINDINGS: Lower chest: No acute abnormality. Hepatobiliary: No focal liver abnormality is seen. No gallstones, gallbladder wall thickening, or biliary dilatation. Pancreas: Unremarkable. No pancreatic ductal dilatation or surrounding inflammatory changes. Spleen: Normal in size without focal abnormality. Adrenals/Urinary Tract: Adrenal glands are unremarkable. Kidneys are normal, without renal calculi, focal lesion, or hydronephrosis. Bladder is unremarkable for the degree of distention. Stomach/Bowel: Stomach is within normal limits. There are few loops of non-dilated fluid-filled small bowel in the right upper abdomen with trace interloop ascites. No obstruction. Normal appendix. Vascular/Lymphatic: Aortic atherosclerosis. No enlarged abdominal or pelvic lymph nodes. Reproductive: Prostate is unremarkable. Other: No abdominal wall hernia or abnormality. Trace free fluid in the pelvis. No pneumoperitoneum. Musculoskeletal: No acute or significant osseous findings. IMPRESSION: 1. Non-dilated fluid-filled small bowel in the right upper abdomen with trace ascites, suggestive of enteritis. No obstruction. 2. Aortic Atherosclerosis (ICD10-I70.0). Electronically Signed   By: M.D.   On: 05/10/2020 12:52   DG Chest Port 1 View  Result Date: 05/11/2020 CLINICAL DATA:  Abdominal and back pain since yesterday EXAM: PORTABLE CHEST 1 VIEW COMPARISON:  07/01/2017 chest CT.  Chest x-ray 05/27/2017 FINDINGS: 0936 hours. The lungs are clear without focal pneumonia, edema, pneumothorax or pleural effusion. Interstitial markings are diffusely coarsened with chronic features. The cardiopericardial silhouette is within normal limits for size. The visualized bony structures of the thorax show no acute abnormality. Telemetry leads overlie the  chest. Prominent gastric bubble and/or colonic gas under the left hemidiaphragm. IMPRESSION: No  acute cardiopulmonary findings. Prominent bowel gas left upper quadrant. Electronically Signed   By: Kennith Center M.D.   On: 05/11/2020 09:53   CT Angio Chest/Abd/Pel for Dissection W and/or W/WO  Result Date: 05/11/2020 CLINICAL DATA:  Abdominal pain with concern for aortic dissection or mesenteric ischemia. EXAM: CT ANGIOGRAPHY CHEST, ABDOMEN AND PELVIS TECHNIQUE: Noncontrast CT was first performed. Multidetector CT imaging through the chest, abdomen and pelvis was performed using the standard protocol during bolus administration of intravenous contrast. Multiplanar reconstructed images and MIPs were obtained and reviewed to evaluate the vascular anatomy. CONTRAST:  42mL OMNIPAQUE IOHEXOL 350 MG/ML SOLN COMPARISON:  Abdomen and pelvis CT from yesterday FINDINGS: CTA CHEST FINDINGS Cardiovascular: Preferential opacification of the thoracic aorta. No evidence of thoracic aortic aneurysm or dissection. Normal heart size. No pericardial effusion. Coronary atherosclerosis with probable prior stenting. There is subendocardial fatty deposition at the left ventricle from remote infarct. The left ventricle appears dilated. The pulmonary arteries are not opacified on this systemic arterially timed study. Atherosclerosis of the aorta and proximal great vessel Mediastinum/Nodes: No adenopathy or mass. Lungs/Pleura: Paraseptal emphysema. Peripheral ground-glass opacity in the left upper lobe measuring 1 cm on 7:67. Subpleural nodules in the lateral right middle lobe measuring up to 4 mm. Numerous additional small pulmonary nodules are noted in the bilateral lung and demarcated on axial lung windows and PACS created MIPS. There is no edema, consolidation, effusion, or pneumothorax. Musculoskeletal: No acute or aggressive finding Review of the MIP images confirms the above findings. CTA ABDOMEN AND PELVIS FINDINGS VASCULAR Aorta: Diffuse atheromatous plaque.  No dissection or aneurysm. Celiac: Patent without evidence of  aneurysm, dissection, vasculitis or significant stenosis. SMA: Diffusely attenuated peripheral branches associated with the loops of edematous bowel, extrinsic narrowing rather than primary vascular process Renals: Single left and 3 right renal arteries without branch occlusion, beading, or aneurysm. IMA: Patent Inflow: Atheromatous plaque. Veins: Negative Review of the MIP images confirms the above findings. NON-VASCULAR Hepatobiliary: No focal liver abnormality.No evidence of biliary obstruction or stone. Detection of calculi is limited due to biliary excretion of contrast from yesterday study. Pancreas: Unremarkable. Spleen: Unremarkable. Adrenals/Urinary Tract: Negative adrenals. No hydronephrosis or stone. Unremarkable bladder. Stomach/Bowel: There is clustered fluid-filled and edematous bowel in the central abdomen affecting multiple loops. The most severely thickened, along the right flank show no detectable mucosal or serosal enhancement. There is distortion of the mesentery suggesting underlying adhesion or internal hernia. Lymphatic: No mass or adenopathy. Reproductive:No pathologic findings. Other: Small volume reactive ascites.  No pneumoperitoneum. Musculoskeletal: No acute abnormalities. Critical Value/emergent results were called by telephone at the time of interpretation on 05/11/2020 at 10:57 am to provider JULIE IDOL , who verbally acknowledged these results. Review of the MIP images confirms the above findings. IMPRESSION: 1. Twisted small-bowel with closed loop obstruction. Multiple loops are very edematous and poorly enhancing, ischemic appearing. No primary vascular cause is seen. 2. 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 Remote subendocardial left ventricular infarct. 3. Small pulmonary nodules, some not seen in 2018, including a ground-glass left upper lobe nodule measuring 1 cm. Recommend follow-up noncontrast chest CT in 6-12 months. Electronically Signed   By: Marnee Spring M.D.   On: 05/11/2020  11:00    ROS:  Pertinent items are noted in HPI.  Blood pressure (!) 161/87, pulse 85, temperature 97.6 F (36.4 C), temperature source Oral, resp. rate 18, height 5\' 8"  (1.727 m), weight 68 kg,  SpO2 99 %. Physical Exam: Cachectic white male who is somewhat uncomfortable lying in the bed. Head is normocephalic, atraumatic Lungs are clear to auscultation with a good breath sounds bilaterally Heart examination reveals regular rate and rhythm without S3, S4, murmurs Abdomen is soft with and not particularly distended.  He is guarding throughout his abdomen, but there is no rigidity present.  CT scan images personally reviewed  Assessment/Plan: Impression: Small bowel obstruction secondary to internal hernia with evidence of early ischemia of the small bowel as compared to a CT scan done yesterday.  He does have a lactic acidosis.  He is cocaine positive from yesterday. Plan: We will take the patient emergently to the operating room for an exploratory laparotomy.  The risks and benefits of the procedure including bleeding, infection, bowel resection, and cardiopulmonary difficulties were fully explained to the patient, who gave informed consent.  Franky Macho 05/11/2020, 12:10 PM

## 2020-05-11 NOTE — Anesthesia Postprocedure Evaluation (Signed)
Anesthesia Post Note  Patient: Damani Kelemen Stlouis  Procedure(s) Performed: EXPLORATORY LAPAROTOMY (N/A ) PARTIAL SMALL BOWEL RESECTION (Abdomen)  Patient location during evaluation: PACU Anesthesia Type: General Level of consciousness: awake, oriented, awake and alert and patient cooperative Pain management: pain level controlled Vital Signs Assessment: post-procedure vital signs reviewed and stable Respiratory status: spontaneous breathing, respiratory function stable, nonlabored ventilation and non-rebreather facemask Cardiovascular status: blood pressure returned to baseline and stable Postop Assessment: no headache and no backache Anesthetic complications: no   No complications documented.   Last Vitals:  Vitals:   05/11/20 1206 05/11/20 1305  BP:  (!) 153/95  Pulse: 85 80  Resp:  16  Temp:  36.6 C  SpO2: 99%     Last Pain:  Vitals:   05/11/20 1305  TempSrc: Oral  PainSc: 8                  Brynda Peon

## 2020-05-11 NOTE — Anesthesia Preprocedure Evaluation (Signed)
Anesthesia Evaluation  Patient identified by MRN, date of birth, ID band Patient awake    Reviewed: Allergy & Precautions, H&P , NPO status , Patient's Chart, lab work & pertinent test results, reviewed documented beta blocker date and time   Airway Mallampati: II  TM Distance: >3 FB Neck ROM: full    Dental no notable dental hx. (+) Teeth Intact   Pulmonary COPD, Current Smoker,    Pulmonary exam normal breath sounds clear to auscultation       Cardiovascular Exercise Tolerance: Good hypertension, + CAD and + Past MI   Rhythm:regular Rate:Normal     Neuro/Psych PSYCHIATRIC DISORDERS Anxiety Depression negative neurological ROS     GI/Hepatic Neg liver ROS, GERD  Medicated,  Endo/Other  negative endocrine ROS  Renal/GU negative Renal ROS  negative genitourinary   Musculoskeletal   Abdominal   Peds  Hematology  (+) Blood dyscrasia, anemia ,   Anesthesia Other Findings   Reproductive/Obstetrics negative OB ROS                             Anesthesia Physical Anesthesia Plan  ASA: IV and emergent  Anesthesia Plan: General   Post-op Pain Management:    Induction:   PONV Risk Score and Plan: 2  Airway Management Planned:   Additional Equipment:   Intra-op Plan:   Post-operative Plan:   Informed Consent: I have reviewed the patients History and Physical, chart, labs and discussed the procedure including the risks, benefits and alternatives for the proposed anesthesia with the patient or authorized representative who has indicated his/her understanding and acceptance.     Dental Advisory Given  Plan Discussed with: CRNA  Anesthesia Plan Comments:         Anesthesia Quick Evaluation

## 2020-05-11 NOTE — Clinical Social Work Note (Signed)
Transition of Care [facility]) - Emergency Department Mini Assessment  Patient Details  Name: Brian Ford MRN: [id number] Date of Birth: 29-Sep-1965  Transition of [facility]) CM/SW Contact:    Ewing Schlein, LCSW Phone Number: 05/11/2020, 11:47 AM  Clinical Narrative: Patient is a 54 year old male who presented to the ED for abdominal and back pain. Per chart review, patient does not have a PCP or insurance. CSW called patient to discuss referrals to [facility] for PCP assistance and the financial counselor. Patient declined referral to financial counselor as he reported he would need in-person help to collect all the needed documents to submit for financial assistance. Patient agreeable to referral for [facility]. CSW called [facility] to make referral. TOC signing off.  ED Mini Assessment: What brought you to the Emergency Department? : Abdominal and back pain Barriers to Discharge: ED Uninsured needing PCP establishment, ED Barriers Resolved Barrier interventions: Referral to [facility] Interventions which prevented an admission or readmission: Other (must enter comment) (Referral to [facility]; discussed financial counselor)  Patient Contact and Communications Key Contact 1: [facility] Contact Date: 05/11/20  Contact time: 1143 Contact Phone Number: [phone] Call outcome: Referral made to [facility]  Admission diagnosis:  EMS Patient Active Problem List   Diagnosis Date Noted  . Abnormal CT of the chest 07/22/2017  . Pulmonary nodules 07/22/2017  . Pulmonary emphysema (HCC) 07/22/2017  . Aortic atherosclerosis (HCC) 07/22/2017  . Coronary artery calcification 07/22/2017  . Cigarette nicotine dependence with nicotine-induced disorder 07/22/2017  . Presence of Bare Metal stent in LAD coronary artery 05/20/2014  . Hyperlipidemia   . Normocytic anemia   . CAD (coronary artery disease)   . Acute coronary syndrome (HCC) 05/19/2014  . MI, old 05/19/2014  . Cocaine  abuse (HCC) 05/19/2014  . Tobacco abuse 05/19/2014  . ST elevation myocardial infarction involving left anterior descending (LAD) coronary artery (HCC) 05/19/2014  . CAD S/P percutaneous coronary angioplasty 12/25/2010   PCP:  Patient, No Pcp Per Pharmacy:   [id number] - [place], [place] - [address] [place] [address] Phone: [phone] Fax: [phone]  [facility] [address] [place] - [address] [place] [address] Phone: [phone] Fax: [phone]  [facility] [place], [place] - [address] [place] [address] Phone: [phone] Fax: [phone]

## 2020-05-11 NOTE — Op Note (Signed)
Patient:  Brian Ford  DOB:  August 06, 1965  MRN:  [id number]   Preop Diagnosis: Small bowel obstruction, question ischemic bowel  Postop Diagnosis: Same, ischemic segment of small intestine  Procedure: Exploratory laparotomy, partial small bowel resection  Surgeon: Franky Macho, MD  Anes: General endotracheal  Indications: Patient is a 54 year old white male who presents with worsening abdominal pain to the emergency room.  A CT scan of the abdomen reveals an internal hernia of small bowel with thickened and edematous portion of the small bowel consistent with worsening ischemia.  He is also noted to have a significant leukocytosis.  He did cocaine yesterday.  The patient now presents emergently to the operating room for exploratory laparotomy.  The risks and benefits of the procedure including bleeding, infection, cardiopulmonary difficulties, and the possibility of a bowel resection were fully explained to the patient, who gave informed consent.  Procedure note: The patient was placed in the supine position.  After induction of general endotracheal anesthesia, the abdomen was prepped and draped using the usual sterile technique with ChloraPrep.  Surgical site confirmation was performed.  A midline incision was made from above the umbilicus to below the umbilicus.  The peritoneal cavity was entered into without difficulty.  The bowel was exteriorized.  There was an adhesion of omentum across the midportion of the small bowel resulting in severe ischemia of the midportion of the small bowel.  Once this adhesion was lysed, the bowel did not return to its normal state.  Approximately 100 cm of small bowel was affected.  It was elected to proceed with a partial small bowel resection.  A GIA-75 stapler was placed proximally distally around the affected segment.  The mesentery was then divided using the LigaSure.  A side-to-side enteroenterostomy was performed using a GIA-75 stapler.  The enterotomy  was closed using a TA 60 stapler.  The staple line was bolstered using 3-0 silk Lembert sutures.  The mesenteric defect was closed using a 3-0 Chromic Gut running suture.  The bowel was then run from the ligament of Treitz to the terminal ileum.  No twist was noted.  The abdominal cavity was then copiously irrigated with normal saline.  All fluid was evacuated from the abdominal cavity.  All operating personnel then changed their gloves.  The fascia was reapproximated using a looped 0 PDS running suture.  Subcutaneous layer was irrigated with normal saline.  Exparel was instilled into the surrounding wound.  The skin was closed using staples.  Betadine ointment and dry sterile dressings were applied.  All tape needle counts were correct at the end of the procedure.  The patient was extubated in the operating room and transferred to PACU in stable condition.  Complications: None  EBL: 50 cc  Specimen: Small bowel

## 2020-05-11 NOTE — ED Notes (Signed)
Date and time results received: 05/11/20 1049   Test: Lactic Acid Critical Value: 2.6  Name of Provider Notified: Herbie Drape  Orders Received? Or Actions Taken?: No new orders given.

## 2020-05-11 NOTE — Interval H&P Note (Signed)
History and Physical Interval Note:  05/11/2020 1:26 PM  Brian Ford  has presented today for surgery, with the diagnosis of small bowel obstruction.  The various methods of treatment have been discussed with the patient and family. After consideration of risks, benefits and other options for treatment, the patient has consented to  Procedure(s): EXPLORATORY LAPAROTOMY (N/A) as a surgical intervention.  The patient's history has been reviewed, patient examined, no change in status, stable for surgery.  I have reviewed the patient's chart and labs.  Questions were answered to the patient's satisfaction.     Franky Macho

## 2020-05-11 NOTE — ED Provider Notes (Signed)
[facility] EMERGENCY DEPARTMENT Provider Note   CSN: [id number] Arrival date & time: 05/11/20  0354     History Chief Complaint  Patient presents with  . Abdominal Pain    Brian Ford is a 54 y.o. male with a history of CAD with history of MI in 2019 with stenting, GERD, hypertension, hyperlipidemia and former history of cocaine abuse stating he stopped when he had his MI presenting for reevaluation of abdominal pain.  He describes generalized abdominal pain which is severe and constant, also radiates into his back and is associated with nausea and vomiting.  He also feels like he needs to have a bowel movement but has been able to.  He denies fevers or chills, does report chest pain which started upon arrival here, no shortness of breath.  No hematemesis.  He has been unable to tolerate any p.o. intake since his symptoms began 3 days ago.  Patient was here yesterday with similar symptoms at which time a CT scan suggested a possible mild right upper quadrant enteritis.  The study also suggested normal biliary anatomy. He reports tntc episodes of vomiting since leaving here yesterday.     HPI     Past Medical History:  Diagnosis Date  . Anxiety   . CAD (coronary artery disease) 2012   a. PCI of 100% RCA (2012) b. s/p BMS to LAD c. 02/2018: Anterior STEMI with DES to LAD; CTO of RCA with collaterals present.   . Crack cocaine use   . Depression   . GERD (gastroesophageal reflux disease)   . Hyperlipidemia   . Hypertension   . Normocytic anemia   . ST elevation myocardial infarction (STEMI) of inferior wall [facility])     Patient Active Problem List   Diagnosis Date Noted  . Abnormal CT of the chest 07/22/2017  . Pulmonary nodules 07/22/2017  . Pulmonary emphysema (HCC) 07/22/2017  . Aortic atherosclerosis (HCC) 07/22/2017  . Coronary artery calcification 07/22/2017  . Cigarette nicotine dependence with nicotine-induced disorder 07/22/2017  . Presence of Bare Metal stent in LAD  coronary artery 05/20/2014  . Hyperlipidemia   . Normocytic anemia   . CAD (coronary artery disease)   . Acute coronary syndrome (HCC) 05/19/2014  . MI, old 05/19/2014  . Cocaine abuse (HCC) 05/19/2014  . Tobacco abuse 05/19/2014  . ST elevation myocardial infarction involving left anterior descending (LAD) coronary artery (HCC) 05/19/2014  . CAD S/P percutaneous coronary angioplasty 12/25/2010    Past Surgical History:  Procedure Laterality Date  . CORONARY ANGIOPLASTY    . CORONARY/GRAFT ACUTE MI REVASCULARIZATION N/A 03/03/2018   Procedure: Coronary/Graft Acute MI Revascularization;  Surgeon: Tonny Bollman, MD;  Location: [facility] INVASIVE CV LAB;  Service: Cardiovascular;  Laterality: N/A;  . LEFT HEART CATH AND CORONARY ANGIOGRAPHY N/A 03/03/2018   Procedure: LEFT HEART CATH AND CORONARY ANGIOGRAPHY;  Surgeon: Tonny Bollman, MD;  Location: [facility] INVASIVE CV LAB;  Service: Cardiovascular;  Laterality: N/A;  . LEFT HEART CATHETERIZATION WITH CORONARY ANGIOGRAM N/A 05/19/2014   Procedure: LEFT HEART CATHETERIZATION WITH CORONARY ANGIOGRAM;  Surgeon: Marykay Lex, MD;  Location: [facility] CATH LAB;  Service: Cardiovascular;  Laterality: N/A;  . NOSE SURGERY    . PERCUTANEOUS CORONARY STENT INTERVENTION (PCI-S)  05/19/2014   Procedure: PERCUTANEOUS CORONARY STENT INTERVENTION (PCI-S);  Surgeon: Marykay Lex, MD;  Location: [facility] CATH LAB;  Service: Cardiovascular;;  BMS to Mid LAD       Family History  Problem Relation Age of Onset  . Hypertension  Mother   . Heart disease Mother   . Stroke Mother   . Diabetes Sister   . Diabetes Brother     Social History   Tobacco Use  . Smoking status: Current Every Day Smoker    Packs/day: 1.00    Years: 35.00    Pack years: 35.00    Types: Cigarettes  . Smokeless tobacco: Never Used  Vaping Use  . Vaping Use: Never used  Substance Use Topics  . Alcohol use: No  . Drug use: Yes    Types: "Crack" cocaine, Oxycodone, Benzodiazepines, Cocaine,  Marijuana    Home Medications Prior to Admission medications   Medication Sig Start Date End Date Taking? Authorizing Provider  aspirin EC 81 MG tablet Take 1 tablet (81 mg total) by mouth daily. Patient not taking: Reported on 05/10/2020 03/04/18   Azalee Course, PA  atorvastatin (LIPITOR) 80 MG tablet Take 1 tablet (80 mg total) by mouth daily at 6 PM. Patient not taking: Reported on 05/10/2020 08/27/19   Jonelle Sidle, MD  carvedilol (COREG) 3.125 MG tablet Take 1 tablet (3.125 mg total) by mouth 2 (two) times daily. Patient not taking: Reported on 05/10/2020 08/27/19 08/26/20  Jonelle Sidle, MD  HYDROcodone-acetaminophen (NORCO/VICODIN) 5-325 MG tablet Take 1 tablet by mouth every 6 (six) hours as needed for moderate pain. 05/11/20   Vanetta Mulders, MD  nitroGLYCERIN (NITROSTAT) 0.4 MG SL tablet Place 1 tablet (0.4 mg total) under the tongue every 5 (five) minutes x 3 doses as needed for chest pain. 03/12/18   Strader, Lennart Pall, PA-C  ondansetron (ZOFRAN) 4 MG tablet Take 1 tablet (4 mg total) by mouth every 8 (eight) hours as needed for nausea or vomiting. 05/11/20   Vanetta Mulders, MD  ticagrelor (BRILINTA) 90 MG TABS tablet Take 1 tablet (90 mg total) by mouth 2 (two) times daily. Patient not taking: Reported on 05/10/2020 03/12/18   Ellsworth Lennox, PA-C    Allergies    Patient has no known allergies.  Review of Systems   Review of Systems  Constitutional: Negative for chills and fever.  HENT: Negative for congestion and sore throat.   Eyes: Negative.   Respiratory: Negative for chest tightness and shortness of breath.   Cardiovascular: Positive for chest pain.  Gastrointestinal: Positive for abdominal pain, nausea and vomiting. Negative for diarrhea.  Genitourinary: Negative.  Negative for dysuria.  Musculoskeletal: Positive for back pain. Negative for arthralgias, joint swelling and neck pain.  Skin: Negative.  Negative for rash and wound.  Neurological: Negative for  dizziness, weakness, light-headedness, numbness and headaches.  Psychiatric/Behavioral: Negative.     Physical Exam Updated Vital Signs BP (!) 161/87   Pulse 85   Temp 97.6 F (36.4 C) (Oral)   Resp 18   Ht 5\' 8"  (1.727 m)   Wt 68 kg   SpO2 99%   BMI 22.79 kg/m   Physical Exam Vitals and nursing note reviewed.  Constitutional:      Appearance: He is well-developed.     Comments: Pt pacing, hunched over, unable to be still for exam.   HENT:     Head: Normocephalic and atraumatic.  Eyes:     Conjunctiva/sclera: Conjunctivae normal.  Cardiovascular:     Rate and Rhythm: Normal rate and regular rhythm.     Heart sounds: Normal heart sounds.  Pulmonary:     Effort: Pulmonary effort is normal.     Breath sounds: Normal breath sounds. No wheezing.  Abdominal:  General: Abdomen is flat. Bowel sounds are normal.     Palpations: Abdomen is soft.     Tenderness: There is generalized abdominal tenderness. There is guarding. Negative signs include McBurney's sign.     Comments: Guarding upper abdomen.   Musculoskeletal:        General: Normal range of motion.     Cervical back: Normal range of motion.  Skin:    General: Skin is warm and dry.  Neurological:     Mental Status: He is alert.     ED Results / Procedures / Treatments   Labs (all labs ordered are listed, but only abnormal results are displayed) Labs Reviewed  RAPID URINE DRUG SCREEN, HOSP PERFORMED - Abnormal; Notable for the following components:      Result Value   Opiates POSITIVE (*)    Cocaine POSITIVE (*)    All other components within normal limits  CBC WITH DIFFERENTIAL/PLATELET - Abnormal; Notable for the following components:   WBC 20.6 (*)    Neutro Abs 18.4 (*)    Abs Immature Granulocytes 0.12 (*)    All other components within normal limits  COMPREHENSIVE METABOLIC PANEL - Abnormal; Notable for the following components:   Glucose, Bld 156 (*)    All other components within normal limits    URINALYSIS, ROUTINE W REFLEX MICROSCOPIC - Abnormal; Notable for the following components:   Color, Urine AMBER (*)    APPearance HAZY (*)    Specific Gravity, Urine 1.033 (*)    Glucose, UA 50 (*)    Hgb urine dipstick LARGE (*)    Ketones, ur 5 (*)    Protein, ur 100 (*)    Nitrite POSITIVE (*)    RBC / HPF >50 (*)    All other components within normal limits  LACTIC ACID, PLASMA - Abnormal; Notable for the following components:   Lactic Acid, Venous 2.5 (*)    All other components within normal limits  RESP PANEL BY RT PCR (RSV, FLU A&B, COVID)  LIPASE, BLOOD  TROPONIN I (HIGH SENSITIVITY)  TROPONIN I (HIGH SENSITIVITY)    EKG None  Radiology CT Abdomen Pelvis W Contrast  Result Date: 05/10/2020 CLINICAL DATA:  Abdominal pain and vomiting. EXAM: CT ABDOMEN AND PELVIS WITH CONTRAST TECHNIQUE: Multidetector CT imaging of the abdomen and pelvis was performed using the standard protocol following bolus administration of intravenous contrast. CONTRAST:  OMNIPAQUE IOHEXOL 300 MG/ML  SOLN COMPARISON:  None. FINDINGS: Lower chest: No acute abnormality. Hepatobiliary: No focal liver abnormality is seen. No gallstones, gallbladder wall thickening, or biliary dilatation. Pancreas: Unremarkable. No pancreatic ductal dilatation or surrounding inflammatory changes. Spleen: Normal in size without focal abnormality. Adrenals/Urinary Tract: Adrenal glands are unremarkable. Kidneys are normal, without renal calculi, focal lesion, or hydronephrosis. Bladder is unremarkable for the degree of distention. Stomach/Bowel: Stomach is within normal limits. There are few loops of non-dilated fluid-filled small bowel in the right upper abdomen with trace interloop ascites. No obstruction. Normal appendix. Vascular/Lymphatic: Aortic atherosclerosis. No enlarged abdominal or pelvic lymph nodes. Reproductive: Prostate is unremarkable. Other: No abdominal wall hernia or abnormality. Trace free fluid in the  pelvis. No pneumoperitoneum. Musculoskeletal: No acute or significant osseous findings. IMPRESSION: 1. Non-dilated fluid-filled small bowel in the right upper abdomen with trace ascites, suggestive of enteritis. No obstruction. 2. Aortic Atherosclerosis (ICD10-I70.0). Electronically Signed   By: Obie Dredge M.D.   On: 05/10/2020 12:52   DG Chest Port 1 View  Result Date: 05/11/2020 CLINICAL DATA:  Abdominal and back pain since yesterday EXAM: PORTABLE CHEST 1 VIEW COMPARISON:  07/01/2017 chest CT.  Chest x-ray 05/27/2017 FINDINGS: 0936 hours. The lungs are clear without focal pneumonia, edema, pneumothorax or pleural effusion. Interstitial markings are diffusely coarsened with chronic features. The cardiopericardial silhouette is within normal limits for size. The visualized bony structures of the thorax show no acute abnormality. Telemetry leads overlie the chest. Prominent gastric bubble and/or colonic gas under the left hemidiaphragm. IMPRESSION: No acute cardiopulmonary findings. Prominent bowel gas left upper quadrant. Electronically Signed   By: Kennith Center M.D.   On: 05/11/2020 09:53   CT Angio Chest/Abd/Pel for Dissection W and/or W/WO  Result Date: 05/11/2020 CLINICAL DATA:  Abdominal pain with concern for aortic dissection or mesenteric ischemia. EXAM: CT ANGIOGRAPHY CHEST, ABDOMEN AND PELVIS TECHNIQUE: Noncontrast CT was first performed. Multidetector CT imaging through the chest, abdomen and pelvis was performed using the standard protocol during bolus administration of intravenous contrast. Multiplanar reconstructed images and MIPs were obtained and reviewed to evaluate the vascular anatomy. CONTRAST:  28mL OMNIPAQUE IOHEXOL 350 MG/ML SOLN COMPARISON:  Abdomen and pelvis CT from yesterday FINDINGS: CTA CHEST FINDINGS Cardiovascular: Preferential opacification of the thoracic aorta. No evidence of thoracic aortic aneurysm or dissection. Normal heart size. No pericardial effusion. Coronary  atherosclerosis with probable prior stenting. There is subendocardial fatty deposition at the left ventricle from remote infarct. The left ventricle appears dilated. The pulmonary arteries are not opacified on this systemic arterially timed study. Atherosclerosis of the aorta and proximal great vessel Mediastinum/Nodes: No adenopathy or mass. Lungs/Pleura: Paraseptal emphysema. Peripheral ground-glass opacity in the left upper lobe measuring 1 cm on 7:67. Subpleural nodules in the lateral right middle lobe measuring up to 4 mm. Numerous additional small pulmonary nodules are noted in the bilateral lung and demarcated on axial lung windows and PACS created MIPS. There is no edema, consolidation, effusion, or pneumothorax. Musculoskeletal: No acute or aggressive finding Review of the MIP images confirms the above findings. CTA ABDOMEN AND PELVIS FINDINGS VASCULAR Aorta: Diffuse atheromatous plaque.  No dissection or aneurysm. Celiac: Patent without evidence of aneurysm, dissection, vasculitis or significant stenosis. SMA: Diffusely attenuated peripheral branches associated with the loops of edematous bowel, extrinsic narrowing rather than primary vascular process Renals: Single left and 3 right renal arteries without branch occlusion, beading, or aneurysm. IMA: Patent Inflow: Atheromatous plaque. Veins: Negative Review of the MIP images confirms the above findings. NON-VASCULAR Hepatobiliary: No focal liver abnormality.No evidence of biliary obstruction or stone. Detection of calculi is limited due to biliary excretion of contrast from yesterday study. Pancreas: Unremarkable. Spleen: Unremarkable. Adrenals/Urinary Tract: Negative adrenals. No hydronephrosis or stone. Unremarkable bladder. Stomach/Bowel: There is clustered fluid-filled and edematous bowel in the central abdomen affecting multiple loops. The most severely thickened, along the right flank show no detectable mucosal or serosal enhancement. There is  distortion of the mesentery suggesting underlying adhesion or internal hernia. Lymphatic: No mass or adenopathy. Reproductive:No pathologic findings. Other: Small volume reactive ascites.  No pneumoperitoneum. Musculoskeletal: No acute abnormalities. Critical Value/emergent results were called by telephone at the time of interpretation on 05/11/2020 at 10:57 am to provider Alyaan Budzynski , who verbally acknowledged these results. Review of the MIP images confirms the above findings. IMPRESSION: 1. Twisted small-bowel with closed loop obstruction. Multiple loops are very edematous and poorly enhancing, ischemic appearing. No primary vascular cause is seen. 2. 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 Remote subendocardial left ventricular infarct. 3. Small pulmonary nodules, some not seen in 2018, including a ground-glass  left upper lobe nodule measuring 1 cm. Recommend follow-up noncontrast chest CT in 6-12 months. Electronically Signed   By: Marnee Spring M.D.   On: 05/11/2020 11:00    Procedures Procedures (including critical care time)  Medications Ordered in ED Medications  ondansetron (ZOFRAN-ODT) disintegrating tablet 4 mg (4 mg Oral Given 05/11/20 0944)  ketorolac (TORADOL) injection 60 mg (60 mg Intramuscular Given 05/11/20 0944)  sulfamethoxazole-trimethoprim (BACTRIM DS) 800-160 MG per tablet 1 tablet (1 tablet Oral Given 05/11/20 1018)  iohexol (OMNIPAQUE) 350 MG/ML injection 80 mL (80 mLs Intravenous Contrast Given 05/11/20 1043)  morphine 4 MG/ML injection 4 mg (4 mg Intravenous Given 05/11/20 1102)  ondansetron (ZOFRAN) injection 4 mg (4 mg Intravenous Given 05/11/20 1111)    ED Course  I have reviewed the triage vital signs and the nursing notes.  Pertinent labs & imaging results that were available during my care of the patient were reviewed by me and considered in my medical decision making (see chart for details).    MDM Rules/Calculators/A&P                          Pt with second visit since  yesteday with worsened abd pain.  Guarding upper abd on exam.  Repeat CT imaging to include evaluation for mesenteric ischemia and aortic dissection, both negative for acute findings, but with close loop small bowel obstruction per call from radiologist.  Spoke with Dr. Lovell Sheehan from gen surg who will see pt in ed, requests NG tube placement.    12:09 PM Dr. Lovell Sheehan here, admitting pt and will take to OR today.     Final Clinical Impression(s) / ED Diagnoses Final diagnoses:  Acute cystitis with hematuria  Cocaine abuse (HCC)  Encounter for assessment for small bowel obstruciton  SBO (small bowel obstruction) [facility])    Rx / DC Orders ED Discharge Orders    None       Victoriano Lain 05/11/20 1210    Bethann Berkshire, MD 05/12/20 [phone]

## 2020-05-11 NOTE — ED Triage Notes (Signed)
Pt brought in EMS, pt moaning and yelling at time of arrival. Pt seen for same yesterday. Pt reports continued abdominal pain and back pain. Pt dry heaving in room at time of triage.

## 2020-05-11 NOTE — H&P (View-Only) (Signed)
Reason for Consult: Small bowel obstruction Referring Physician: Dr. Zammit  Brian Ford is an 53 y.o. male.  HPI: Patient is a 53-year-old white male who was seen in the emergency room yesterday for abdominal and back pain who left AGAINST MEDICAL ADVICE yesterday who presented to emergency room again today with worsening abdominal and back pain.  A CT scan of the abdomen was performed which revealed possible internal hernia of the small intestine with evidence of ischemic changes.  Patient states he has had intermittent abdominal pain over the past few months with a decreased appetite and weight loss.  He is cocaine positive and did smoke cocaine yesterday.  He denies any history of abdominal surgery.  His pain is somewhat eased up with pain medication.  Past Medical History:  Diagnosis Date  . Anxiety   . CAD (coronary artery disease) 2012   a. PCI of 100% RCA (2012) b. s/p BMS to LAD c. 02/2018: Anterior STEMI with DES to LAD; CTO of RCA with collaterals present.   . Crack cocaine use   . Depression   . GERD (gastroesophageal reflux disease)   . Hyperlipidemia   . Hypertension   . Normocytic anemia   . ST elevation myocardial infarction (STEMI) of inferior wall (HCC)     Past Surgical History:  Procedure Laterality Date  . CORONARY ANGIOPLASTY    . CORONARY/GRAFT ACUTE MI REVASCULARIZATION N/A 03/03/2018   Procedure: Coronary/Graft Acute MI Revascularization;  Surgeon: Cooper, Michael, MD;  Location: [facility];  Service: Cardiovascular;  Laterality: N/A;  . LEFT HEART CATH AND CORONARY ANGIOGRAPHY N/A 03/03/2018   Procedure: LEFT HEART CATH AND CORONARY ANGIOGRAPHY;  Surgeon: Cooper, Michael, MD;  Location: [facility];  Service: Cardiovascular;  Laterality: N/A;  . LEFT HEART CATHETERIZATION WITH CORONARY ANGIOGRAM N/A 05/19/2014   Procedure: LEFT HEART CATHETERIZATION WITH CORONARY ANGIOGRAM;  Surgeon: Tien W Harding, MD;  Location: [facility];  Service:  Cardiovascular;  Laterality: N/A;  . NOSE SURGERY    . PERCUTANEOUS CORONARY STENT INTERVENTION (PCI-S)  05/19/2014   Procedure: PERCUTANEOUS CORONARY STENT INTERVENTION (PCI-S);  Surgeon: Shanon W Harding, MD;  Location: [facility];  Service: Cardiovascular;;  BMS to Mid LAD    Family History  Problem Relation Age of Onset  . Hypertension Mother   . Heart disease Mother   . Stroke Mother   . Diabetes Sister   . Diabetes Brother     Social History:  reports that he has been smoking cigarettes. He has a 35.00 pack-year smoking history. He has never used smokeless tobacco. He reports current drug use. Drugs: "Crack" cocaine, Oxycodone, Benzodiazepines, Cocaine, and Marijuana. He reports that he does not drink alcohol.  Allergies: No Known Allergies  Medications: I have reviewed the patient's current medications.  Results for orders placed or performed during the hospital encounter of 05/11/20 (from the past 48 hour(s))  Rapid urine drug screen (hospital performed)     Status: Abnormal   Collection Time: 05/11/20  9:26 AM  Result Value Ref Range   Opiates POSITIVE (A) NONE DETECTED   Cocaine POSITIVE (A) NONE DETECTED   Benzodiazepines NONE DETECTED NONE DETECTED   Amphetamines NONE DETECTED NONE DETECTED   Tetrahydrocannabinol NONE DETECTED NONE DETECTED   Barbiturates NONE DETECTED NONE DETECTED    Comment: (NOTE) DRUG SCREEN FOR MEDICAL PURPOSES ONLY.  IF CONFIRMATION IS NEEDED FOR ANY PURPOSE, NOTIFY LAB WITHIN 5 DAYS.  LOWEST DETECTABLE LIMITS FOR URINE DRUG SCREEN Drug Class                       Cutoff (ng/mL) Amphetamine and metabolites    1000 Barbiturate and metabolites    200 Benzodiazepine                 200 Tricyclics and metabolites     300 Opiates and metabolites        300 Cocaine and metabolites        300 THC                            50 Performed at [facility], [address], [place], [place] [address]   Urinalysis, Routine w reflex microscopic  Urine, Clean Catch     Status: Abnormal   Collection Time: 05/11/20  9:27 AM  Result Value Ref Range   Color, Urine AMBER (A) YELLOW    Comment: BIOCHEMICALS MAY BE AFFECTED BY COLOR   APPearance HAZY (A) CLEAR   Specific Gravity, Urine 1.033 (H) 1.005 - 1.030   pH 5.0 5.0 - 8.0   Glucose, UA 50 (A) NEGATIVE mg/dL   Hgb urine dipstick LARGE (A) NEGATIVE   Bilirubin Urine NEGATIVE NEGATIVE   Ketones, ur 5 (A) NEGATIVE mg/dL   Protein, ur 607 (A) NEGATIVE mg/dL   Nitrite POSITIVE (A) NEGATIVE   Leukocytes,Ua NEGATIVE NEGATIVE   RBC / HPF >50 (H) 0 - 5 RBC/hpf   WBC, UA 6-10 0 - 5 WBC/hpf   Bacteria, UA NONE SEEN NONE SEEN   Mucus PRESENT    Hyaline Casts, UA PRESENT     Comment: Performed at [facility], [address] [place], [place] [address]  CBC with Differential/Platelet     Status: Abnormal   Collection Time: 05/11/20  9:45 AM  Result Value Ref Range   WBC 20.6 (H) 4.0 - 10.5 K/uL   RBC 5.77 4.22 - 5.81 MIL/uL   Hemoglobin 16.2 13.0 - 17.0 g/dL   HCT 26.9 39 - 52 %   MCV 84.4 80.0 - 100.0 fL   MCH 28.1 26.0 - 34.0 pg   MCHC 33.3 30.0 - 36.0 g/dL   RDW 48.5 46.2 - 70.3 %   Platelets 326 150 - 400 K/uL   nRBC 0.0 0.0 - 0.2 %   Neutrophils Relative % 88 %   Neutro Abs 18.4 (H) 1.7 - 7.7 K/uL   Lymphocytes Relative 7 %   Lymphs Abs 1.3 0.7 - 4.0 K/uL   Monocytes Relative 4 %   Monocytes Absolute 0.7 0.1 - 1.0 K/uL   Eosinophils Relative 0 %   Eosinophils Absolute 0.0 0 - 0 K/uL   Basophils Relative 0 %   Basophils Absolute 0.1 0 - 0 K/uL   Immature Granulocytes 1 %   Abs Immature Granulocytes 0.12 (H) 0.00 - 0.07 K/uL    Comment: Performed at [facility], [address], [place], [place] [address]  Comprehensive metabolic panel     Status: Abnormal   Collection Time: 05/11/20  9:45 AM  Result Value Ref Range   Sodium 138 135 - 145 mmol/L   Potassium 3.5 3.5 - 5.1 mmol/L    Comment: DELTA CHECK NOTED   Chloride 102 98 - 111 mmol/L   CO2 24 22 - 32 mmol/L    Glucose, Bld 156 (H) 70 - 99 mg/dL    Comment: Glucose reference range applies only to samples taken after fasting for at least 8 hours.   BUN 19 6 - 20 mg/dL   Creatinine, Ser 8.18 0.61 - 1.24 mg/dL  Calcium 9.2 8.9 - 10.3 mg/dL   Total Protein 7.0 6.5 - 8.1 g/dL   Albumin 3.9 3.5 - 5.0 g/dL   AST 21 15 - 41 U/L   ALT 15 0 - 44 U/L   Alkaline Phosphatase 69 38 - 126 U/L   Total Bilirubin 1.0 0.3 - 1.2 mg/dL   GFR calc non Af Amer >60 >60 mL/min   Anion gap 12 5 - 15    Comment: Performed at [facility], [address], [place], [place] [address]  Lipase, blood     Status: None   Collection Time: 05/11/20  9:45 AM  Result Value Ref Range   Lipase 24 11 - 51 U/L    Comment: Performed at [facility], [address] [place], [place] [address]  Troponin I (High Sensitivity)     Status: None   Collection Time: 05/11/20  9:45 AM  Result Value Ref Range   Troponin I (High Sensitivity) 14 <18 ng/L    Comment: (NOTE) Elevated high sensitivity troponin I (hsTnI) values and significant  changes across serial measurements may suggest ACS but many other  chronic and acute conditions are known to elevate hsTnI results.  Refer to the Links section for chest pain algorithms and additional  guidance. Performed at [facility], [address], [place], [place] [address]   Lactic acid, plasma     Status: Abnormal   Collection Time: 05/11/20  9:45 AM  Result Value Ref Range   Lactic Acid, Venous 2.5 (HH) 0.5 - 1.9 mmol/L    Comment: CRITICAL RESULT CALLED TO, READ BACK BY AND VERIFIED WITH: JENNIFER KENDRICK @1047  05/11/20 BY JONEST Performed at [facility], [address] [place], [place]     CT Abdomen Pelvis W Contrast  Result Date: 05/10/2020 CLINICAL DATA:  Abdominal pain and vomiting. EXAM: CT ABDOMEN AND PELVIS WITH CONTRAST TECHNIQUE: Multidetector CT imaging of the abdomen and pelvis was performed using the standard protocol following bolus administration of intravenous  contrast. CONTRAST:  07/10/2020 OMNIPAQUE IOHEXOL 300 MG/ML  SOLN COMPARISON:  None. FINDINGS: Lower chest: No acute abnormality. Hepatobiliary: No focal liver abnormality is seen. No gallstones, gallbladder wall thickening, or biliary dilatation. Pancreas: Unremarkable. No pancreatic ductal dilatation or surrounding inflammatory changes. Spleen: Normal in size without focal abnormality. Adrenals/Urinary Tract: Adrenal glands are unremarkable. Kidneys are normal, without renal calculi, focal lesion, or hydronephrosis. Bladder is unremarkable for the degree of distention. Stomach/Bowel: Stomach is within normal limits. There are few loops of non-dilated fluid-filled small bowel in the right upper abdomen with trace interloop ascites. No obstruction. Normal appendix. Vascular/Lymphatic: Aortic atherosclerosis. No enlarged abdominal or pelvic lymph nodes. Reproductive: Prostate is unremarkable. Other: No abdominal wall hernia or abnormality. Trace free fluid in the pelvis. No pneumoperitoneum. Musculoskeletal: No acute or significant osseous findings. IMPRESSION: 1. Non-dilated fluid-filled small bowel in the right upper abdomen with trace ascites, suggestive of enteritis. No obstruction. 2. Aortic Atherosclerosis (ICD10-I70.0). Electronically Signed   By: M.D.   On: 05/10/2020 12:52   DG Chest Port 1 View  Result Date: 05/11/2020 CLINICAL DATA:  Abdominal and back pain since yesterday EXAM: PORTABLE CHEST 1 VIEW COMPARISON:  07/01/2017 chest CT.  Chest x-ray 05/27/2017 FINDINGS: 0936 hours. The lungs are clear without focal pneumonia, edema, pneumothorax or pleural effusion. Interstitial markings are diffusely coarsened with chronic features. The cardiopericardial silhouette is within normal limits for size. The visualized bony structures of the thorax show no acute abnormality. Telemetry leads overlie the  chest. Prominent gastric bubble and/or colonic gas under the left hemidiaphragm. IMPRESSION: No  acute cardiopulmonary findings. Prominent bowel gas left upper quadrant. Electronically Signed   By: Kennith Center M.D.   On: 05/11/2020 09:53   CT Angio Chest/Abd/Pel for Dissection W and/or W/WO  Result Date: 05/11/2020 CLINICAL DATA:  Abdominal pain with concern for aortic dissection or mesenteric ischemia. EXAM: CT ANGIOGRAPHY CHEST, ABDOMEN AND PELVIS TECHNIQUE: Noncontrast CT was first performed. Multidetector CT imaging through the chest, abdomen and pelvis was performed using the standard protocol during bolus administration of intravenous contrast. Multiplanar reconstructed images and MIPs were obtained and reviewed to evaluate the vascular anatomy. CONTRAST:  42mL OMNIPAQUE IOHEXOL 350 MG/ML SOLN COMPARISON:  Abdomen and pelvis CT from yesterday FINDINGS: CTA CHEST FINDINGS Cardiovascular: Preferential opacification of the thoracic aorta. No evidence of thoracic aortic aneurysm or dissection. Normal heart size. No pericardial effusion. Coronary atherosclerosis with probable prior stenting. There is subendocardial fatty deposition at the left ventricle from remote infarct. The left ventricle appears dilated. The pulmonary arteries are not opacified on this systemic arterially timed study. Atherosclerosis of the aorta and proximal great vessel Mediastinum/Nodes: No adenopathy or mass. Lungs/Pleura: Paraseptal emphysema. Peripheral ground-glass opacity in the left upper lobe measuring 1 cm on 7:67. Subpleural nodules in the lateral right middle lobe measuring up to 4 mm. Numerous additional small pulmonary nodules are noted in the bilateral lung and demarcated on axial lung windows and PACS created MIPS. There is no edema, consolidation, effusion, or pneumothorax. Musculoskeletal: No acute or aggressive finding Review of the MIP images confirms the above findings. CTA ABDOMEN AND PELVIS FINDINGS VASCULAR Aorta: Diffuse atheromatous plaque.  No dissection or aneurysm. Celiac: Patent without evidence of  aneurysm, dissection, vasculitis or significant stenosis. SMA: Diffusely attenuated peripheral branches associated with the loops of edematous bowel, extrinsic narrowing rather than primary vascular process Renals: Single left and 3 right renal arteries without branch occlusion, beading, or aneurysm. IMA: Patent Inflow: Atheromatous plaque. Veins: Negative Review of the MIP images confirms the above findings. NON-VASCULAR Hepatobiliary: No focal liver abnormality.No evidence of biliary obstruction or stone. Detection of calculi is limited due to biliary excretion of contrast from yesterday study. Pancreas: Unremarkable. Spleen: Unremarkable. Adrenals/Urinary Tract: Negative adrenals. No hydronephrosis or stone. Unremarkable bladder. Stomach/Bowel: There is clustered fluid-filled and edematous bowel in the central abdomen affecting multiple loops. The most severely thickened, along the right flank show no detectable mucosal or serosal enhancement. There is distortion of the mesentery suggesting underlying adhesion or internal hernia. Lymphatic: No mass or adenopathy. Reproductive:No pathologic findings. Other: Small volume reactive ascites.  No pneumoperitoneum. Musculoskeletal: No acute abnormalities. Critical Value/emergent results were called by telephone at the time of interpretation on 05/11/2020 at 10:57 am to provider JULIE IDOL , who verbally acknowledged these results. Review of the MIP images confirms the above findings. IMPRESSION: 1. Twisted small-bowel with closed loop obstruction. Multiple loops are very edematous and poorly enhancing, ischemic appearing. No primary vascular cause is seen. 2. 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 Remote subendocardial left ventricular infarct. 3. Small pulmonary nodules, some not seen in 2018, including a ground-glass left upper lobe nodule measuring 1 cm. Recommend follow-up noncontrast chest CT in 6-12 months. Electronically Signed   By: Marnee Spring M.D.   On: 05/11/2020  11:00    ROS:  Pertinent items are noted in HPI.  Blood pressure (!) 161/87, pulse 85, temperature 97.6 F (36.4 C), temperature source Oral, resp. rate 18, height 5\' 8"  (1.727 m), weight 68 kg,  SpO2 99 %. Physical Exam: Cachectic white male who is somewhat uncomfortable lying in the bed. Head is normocephalic, atraumatic Lungs are clear to auscultation with a good breath sounds bilaterally Heart examination reveals regular rate and rhythm without S3, S4, murmurs Abdomen is soft with and not particularly distended.  He is guarding throughout his abdomen, but there is no rigidity present.  CT scan images personally reviewed  Assessment/Plan: Impression: Small bowel obstruction secondary to internal hernia with evidence of early ischemia of the small bowel as compared to a CT scan done yesterday.  He does have a lactic acidosis.  He is cocaine positive from yesterday. Plan: We will take the patient emergently to the operating room for an exploratory laparotomy.  The risks and benefits of the procedure including bleeding, infection, bowel resection, and cardiopulmonary difficulties were fully explained to the patient, who gave informed consent.  Franky Macho 05/11/2020, 12:10 PM

## 2020-05-11 NOTE — ED Notes (Signed)
Unable to get down NG tube. Pt not tolerating at all

## 2020-05-11 NOTE — Consult Note (Deleted)
Urgent consult for a right tension pneumothorax secondary to fall with resultant multiple rib fractures.  The risks and benefits of chest tube placement including bleeding and infection were fully explained to the patient, who gave informed consent.  Procedure note: The procedure was performed in room 2 of the emergency room.  The patient was placed on his side with his right side up.  Surgical site confirmation was performed.  1% Xylocaine was used for local anesthesia.  A small nick in the skin was made at the level of the nipple along the mid axillary line.  A 20 French tube was placed into the right chest without difficulty.  The tube was connected to a Pleur-evac and air was noted to be emanating through the system.  It was secured in place using a 3-0 silk suture.  A dressing was applied.  The patient tolerated the procedure well.  His oxygen saturations improved from 90 to 98%.  A chest x-ray is pending.

## 2020-05-12 LAB — BASIC METABOLIC PANEL
Anion gap: 8 (ref 5–15)
BUN: 19 mg/dL (ref 6–20)
CO2: 24 mmol/L (ref 22–32)
Calcium: 7.7 mg/dL — ABNORMAL LOW (ref 8.9–10.3)
Chloride: 107 mmol/L (ref 98–111)
Creatinine, Ser: 0.98 mg/dL (ref 0.61–1.24)
GFR calc non Af Amer: >60 mL/min (ref >60)
Glucose, Bld: 70 mg/dL (ref 70–99)
Potassium: 3.7 mmol/L (ref 3.5–5.1)
Sodium: 139 mmol/L (ref 135–145)

## 2020-05-12 LAB — CBC
HCT: 38.6 % — ABNORMAL LOW (ref 39.0–52.0)
Hemoglobin: 12.8 g/dL — ABNORMAL LOW (ref 13.0–17.0)
MCH: 28.9 pg (ref 26.0–34.0)
MCHC: 33.2 g/dL (ref 30.0–36.0)
MCV: 87.1 fL (ref 80.0–100.0)
Platelets: 155 10*3/uL (ref 150–400)
RBC: 4.43 MIL/uL (ref 4.22–5.81)
RDW: 13.6 % (ref 11.5–15.5)
WBC: 10.3 10*3/uL (ref 4.0–10.5)
nRBC: 0 % (ref 0.0–0.2)

## 2020-05-12 LAB — MAGNESIUM: Magnesium: 1.8 mg/dL (ref 1.7–2.4)

## 2020-05-12 LAB — PHOSPHORUS: Phosphorus: 2.7 mg/dL (ref 2.5–4.6)

## 2020-05-12 LAB — URINE CULTURE: Culture: NO GROWTH

## 2020-05-12 MED ORDER — CHLORHEXIDINE GLUCONATE CLOTH 2 % EX PADS
6.0000 | MEDICATED_PAD | Freq: Every day | CUTANEOUS | Status: DC
  Administered 2020-05-12 – 2020-05-14 (×3): 6 via TOPICAL

## 2020-05-12 MED FILL — Hydrocodone-Acetaminophen Tab 5-325 MG: ORAL | Qty: 6 | Status: AC

## 2020-05-12 NOTE — Progress Notes (Signed)
Report given to [place] on 300.

## 2020-05-12 NOTE — Progress Notes (Signed)
Patient requesting magnifying glass and stating that he can get it from the chaplain. RN attempted to contact chaplain, but let patient know that chaplain was not there today. Patient then asked RN to text his cell phone from personal cell phone and RN declined. Patient then began cursing and yelling at RN and stated he wanted a new nurse and to get out of his room. He then called up to the front to say he wanted to leave the hospital. Dr. Lovell Sheehan notified.

## 2020-05-12 NOTE — Progress Notes (Signed)
1 Day Post-Op  Subjective: Patient complains of incisional pain.  No bowel movement or flatus yet.  Objective: Vital signs in last 24 hours: Temp:  [97.5 F (36.4 C)-98.6 F (37 C)] 98.6 F (37 C) (10/08 0400) Pulse Rate:  [67-104] 84 (10/08 0900) Resp:  [11-22] 15 (10/08 0900) BP: (106-166)/(55-99) 116/55 (10/07 2058) SpO2:  [92 %-100 %] 96 % (10/08 0900) Arterial Line BP: (109-138)/(52-71) 138/69 (10/08 0900) Weight:  [66 kg] 66 kg (10/07 1708)    Intake/Output from previous day: 10/07 0701 - 10/08 0700 In: 3201.6 [I.V.:3101.6; IV Piggyback:100] Out: 975 [Urine:825; Blood:50] Intake/Output this shift: No intake/output data recorded.  General appearance: alert, cooperative and no distress Resp: clear to auscultation bilaterally Cardio: regular rate and rhythm, S1, S2 normal, no murmur, click, rub or gallop GI: Soft, flat.  Incision healing well.  No bowel sounds appreciated.  Lab Results:  Recent Labs    05/11/20 0945 05/12/20 0503  WBC 20.6* 10.3  HGB 16.2 12.8*  HCT 48.7 38.6*  PLT 326 155   BMET Recent Labs    05/11/20 0945 05/12/20 0503  NA 138 139  K 3.5 3.7  CL 102 107  CO2 24 24  GLUCOSE 156* 70  BUN 19 19  CREATININE 1.10 0.98  CALCIUM 9.2 7.7*   PT/INR No results for input(s): LABPROT, INR in the last 72 hours.  Studies/Results: CT Abdomen Pelvis W Contrast  Result Date: 05/10/2020 CLINICAL DATA:  Abdominal pain and vomiting. EXAM: CT ABDOMEN AND PELVIS WITH CONTRAST TECHNIQUE: Multidetector CT imaging of the abdomen and pelvis was performed using the standard protocol following bolus administration of intravenous contrast. CONTRAST:  OMNIPAQUE IOHEXOL 300 MG/ML  SOLN COMPARISON:  None. FINDINGS: Lower chest: No acute abnormality. Hepatobiliary: No focal liver abnormality is seen. No gallstones, gallbladder wall thickening, or biliary dilatation. Pancreas: Unremarkable. No pancreatic ductal dilatation or surrounding inflammatory changes.  Spleen: Normal in size without focal abnormality. Adrenals/Urinary Tract: Adrenal glands are unremarkable. Kidneys are normal, without renal calculi, focal lesion, or hydronephrosis. Bladder is unremarkable for the degree of distention. Stomach/Bowel: Stomach is within normal limits. There are few loops of non-dilated fluid-filled small bowel in the right upper abdomen with trace interloop ascites. No obstruction. Normal appendix. Vascular/Lymphatic: Aortic atherosclerosis. No enlarged abdominal or pelvic lymph nodes. Reproductive: Prostate is unremarkable. Other: No abdominal wall hernia or abnormality. Trace free fluid in the pelvis. No pneumoperitoneum. Musculoskeletal: No acute or significant osseous findings. IMPRESSION: 1. Non-dilated fluid-filled small bowel in the right upper abdomen with trace ascites, suggestive of enteritis. No obstruction. 2. Aortic Atherosclerosis (ICD10-I70.0). Electronically Signed   By: Obie Dredge M.D.   On: 05/10/2020 12:52   DG Chest Portable 1 View  Result Date: 05/11/2020 CLINICAL DATA:  Nasogastric tube placement. EXAM: PORTABLE CHEST 1 VIEW COMPARISON:  Same day. FINDINGS: The heart size and mediastinal contours are within normal limits. Both lungs are clear. Nasogastric tube is kinked with distal tip in mid esophagus. The visualized skeletal structures are unremarkable. IMPRESSION: Nasogastric tube is kinked with distal tip in mid esophagus. No acute cardiopulmonary abnormality seen. Electronically Signed   By: Lupita Raider M.D.   On: 05/11/2020 12:14   DG Chest Port 1 View  Result Date: 05/11/2020 CLINICAL DATA:  Abdominal and back pain since yesterday EXAM: PORTABLE CHEST 1 VIEW COMPARISON:  07/01/2017 chest CT.  Chest x-ray 05/27/2017 FINDINGS: 0936 hours. The lungs are clear without focal pneumonia, edema, pneumothorax or pleural effusion. Interstitial markings are diffusely coarsened  with chronic features. The cardiopericardial silhouette is within normal  limits for size. The visualized bony structures of the thorax show no acute abnormality. Telemetry leads overlie the chest. Prominent gastric bubble and/or colonic gas under the left hemidiaphragm. IMPRESSION: No acute cardiopulmonary findings. Prominent bowel gas left upper quadrant. Electronically Signed   By: Kennith Center M.D.   On: 05/11/2020 09:53   CT Angio Chest/Abd/Pel for Dissection W and/or W/WO  Result Date: 05/11/2020 CLINICAL DATA:  Abdominal pain with concern for aortic dissection or mesenteric ischemia. EXAM: CT ANGIOGRAPHY CHEST, ABDOMEN AND PELVIS TECHNIQUE: Noncontrast CT was first performed. Multidetector CT imaging through the chest, abdomen and pelvis was performed using the standard protocol during bolus administration of intravenous contrast. Multiplanar reconstructed images and MIPs were obtained and reviewed to evaluate the vascular anatomy. CONTRAST:  73mL OMNIPAQUE IOHEXOL 350 MG/ML SOLN COMPARISON:  Abdomen and pelvis CT from yesterday FINDINGS: CTA CHEST FINDINGS Cardiovascular: Preferential opacification of the thoracic aorta. No evidence of thoracic aortic aneurysm or dissection. Normal heart size. No pericardial effusion. Coronary atherosclerosis with probable prior stenting. There is subendocardial fatty deposition at the left ventricle from remote infarct. The left ventricle appears dilated. The pulmonary arteries are not opacified on this systemic arterially timed study. Atherosclerosis of the aorta and proximal great vessel Mediastinum/Nodes: No adenopathy or mass. Lungs/Pleura: Paraseptal emphysema. Peripheral ground-glass opacity in the left upper lobe measuring 1 cm on 7:67. Subpleural nodules in the lateral right middle lobe measuring up to 4 mm. Numerous additional small pulmonary nodules are noted in the bilateral lung and demarcated on axial lung windows and PACS created MIPS. There is no edema, consolidation, effusion, or pneumothorax. Musculoskeletal: No acute or  aggressive finding Review of the MIP images confirms the above findings. CTA ABDOMEN AND PELVIS FINDINGS VASCULAR Aorta: Diffuse atheromatous plaque.  No dissection or aneurysm. Celiac: Patent without evidence of aneurysm, dissection, vasculitis or significant stenosis. SMA: Diffusely attenuated peripheral branches associated with the loops of edematous bowel, extrinsic narrowing rather than primary vascular process Renals: Single left and 3 right renal arteries without branch occlusion, beading, or aneurysm. IMA: Patent Inflow: Atheromatous plaque. Veins: Negative Review of the MIP images confirms the above findings. NON-VASCULAR Hepatobiliary: No focal liver abnormality.No evidence of biliary obstruction or stone. Detection of calculi is limited due to biliary excretion of contrast from yesterday study. Pancreas: Unremarkable. Spleen: Unremarkable. Adrenals/Urinary Tract: Negative adrenals. No hydronephrosis or stone. Unremarkable bladder. Stomach/Bowel: There is clustered fluid-filled and edematous bowel in the central abdomen affecting multiple loops. The most severely thickened, along the right flank show no detectable mucosal or serosal enhancement. There is distortion of the mesentery suggesting underlying adhesion or internal hernia. Lymphatic: No mass or adenopathy. Reproductive:No pathologic findings. Other: Small volume reactive ascites.  No pneumoperitoneum. Musculoskeletal: No acute abnormalities. Critical Value/emergent results were called by telephone at the time of interpretation on 05/11/2020 at 10:57 am to provider JULIE IDOL , who verbally acknowledged these results. Review of the MIP images confirms the above findings. IMPRESSION: 1. Twisted small-bowel with closed loop obstruction. Multiple loops are very edematous and poorly enhancing, ischemic appearing. No primary vascular cause is seen. 2. 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 Remote subendocardial left ventricular infarct. 3. Small pulmonary  nodules, some not seen in 2018, including a ground-glass left upper lobe nodule measuring 1 cm. Recommend follow-up noncontrast chest CT in 6-12 months. Electronically Signed   By: Marnee Spring M.D.   On: 05/11/2020 11:00    Anti-infectives: Anti-infectives (From admission, onward)  Start     Dose/Rate Route Frequency Ordered Stop   05/12/20 0600  cefoTEtan (CEFOTAN) 2 g in sodium chloride 0.9 % 100 mL IVPB        2 g 200 mL/hr over 30 Minutes Intravenous On call to O.R. 05/11/20 1304 05/11/20 1410   05/11/20 1000  sulfamethoxazole-trimethoprim (BACTRIM DS) 800-160 MG per tablet 1 tablet        1 tablet Oral  Once 05/11/20 0954 05/11/20 1018      Assessment/Plan: s/p Procedure(s): EXPLORATORY LAPAROTOMY PARTIAL SMALL BOWEL RESECTION Impression: Postoperative day 1.  Stable.  Urine output is picking up.  Blood pressure stable.  Pain control will be a problem due to his recent history of cocaine use.  I reassured the patient that I would continue giving him pain medication as able.  Patient is stable for transfer to the regular floor.  Awaiting return of bowel function.  LOS: 1 day    Brian Ford 05/12/2020

## 2020-05-13 ENCOUNTER — Inpatient Hospital Stay (HOSPITAL_COMMUNITY): Payer: Self-pay

## 2020-05-13 DIAGNOSIS — J439 Emphysema, unspecified: Secondary | ICD-10-CM

## 2020-05-13 DIAGNOSIS — I351 Nonrheumatic aortic (valve) insufficiency: Secondary | ICD-10-CM

## 2020-05-13 DIAGNOSIS — F1410 Cocaine abuse, uncomplicated: Secondary | ICD-10-CM

## 2020-05-13 DIAGNOSIS — E782 Mixed hyperlipidemia: Secondary | ICD-10-CM

## 2020-05-13 DIAGNOSIS — Z720 Tobacco use: Secondary | ICD-10-CM

## 2020-05-13 DIAGNOSIS — E876 Hypokalemia: Secondary | ICD-10-CM

## 2020-05-13 DIAGNOSIS — I340 Nonrheumatic mitral (valve) insufficiency: Secondary | ICD-10-CM

## 2020-05-13 DIAGNOSIS — I361 Nonrheumatic tricuspid (valve) insufficiency: Secondary | ICD-10-CM

## 2020-05-13 LAB — LIPID PANEL
Cholesterol: 108 mg/dL (ref 0–200)
HDL: 31 mg/dL — ABNORMAL LOW (ref >40)
LDL Cholesterol: 63 mg/dL (ref 0–99)
Total CHOL/HDL Ratio: 3.5 RATIO
Triglycerides: 70 mg/dL (ref <150)
VLDL: 14 mg/dL (ref 0–40)

## 2020-05-13 LAB — CBC
HCT: 37.8 % — ABNORMAL LOW (ref 39.0–52.0)
HCT: 34.8 % — ABNORMAL LOW (ref 39.0–52.0)
Hemoglobin: 12.5 g/dL — ABNORMAL LOW (ref 13.0–17.0)
Hemoglobin: 11.9 g/dL — ABNORMAL LOW (ref 13.0–17.0)
MCH: 28 pg (ref 26.0–34.0)
MCH: 29.2 pg (ref 26.0–34.0)
MCHC: 33.1 g/dL (ref 30.0–36.0)
MCHC: 34.2 g/dL (ref 30.0–36.0)
MCV: 84.8 fL (ref 80.0–100.0)
MCV: 85.3 fL (ref 80.0–100.0)
Platelets: 156 10*3/uL (ref 150–400)
Platelets: 147 10*3/uL — ABNORMAL LOW (ref 150–400)
RBC: 4.46 MIL/uL (ref 4.22–5.81)
RBC: 4.08 MIL/uL — ABNORMAL LOW (ref 4.22–5.81)
RDW: 13.1 % (ref 11.5–15.5)
RDW: 13.2 % (ref 11.5–15.5)
WBC: 13.6 10*3/uL — ABNORMAL HIGH (ref 4.0–10.5)
WBC: 13.3 10*3/uL — ABNORMAL HIGH (ref 4.0–10.5)
nRBC: 0 % (ref 0.0–0.2)
nRBC: 0 % (ref 0.0–0.2)

## 2020-05-13 LAB — ECHOCARDIOGRAM COMPLETE
Area-P 1/2: 4.63 cm2
Height: 68 in
MV M vel: 4.78 m/s
MV Peak grad: 91.4 mmHg
P 1/2 time: 572 msec
Radius: 0.7 cm
S' Lateral: 5.78 cm
Weight: 2328.06 oz

## 2020-05-13 LAB — BASIC METABOLIC PANEL
Anion gap: 8 (ref 5–15)
BUN: 12 mg/dL (ref 6–20)
CO2: 24 mmol/L (ref 22–32)
Calcium: 7.7 mg/dL — ABNORMAL LOW (ref 8.9–10.3)
Chloride: 100 mmol/L (ref 98–111)
Creatinine, Ser: 0.87 mg/dL (ref 0.61–1.24)
GFR, Estimated: >60 mL/min (ref >60)
Glucose, Bld: 76 mg/dL (ref 70–99)
Potassium: 3.3 mmol/L — ABNORMAL LOW (ref 3.5–5.1)
Sodium: 132 mmol/L — ABNORMAL LOW (ref 135–145)

## 2020-05-13 LAB — D-DIMER, QUANTITATIVE: D-Dimer, Quant: 5.39 ug/mL-FEU — ABNORMAL HIGH (ref 0.00–0.50)

## 2020-05-13 LAB — TROPONIN I (HIGH SENSITIVITY)
Troponin I (High Sensitivity): 29 ng/L — ABNORMAL HIGH (ref <18)
Troponin I (High Sensitivity): 26 ng/L — ABNORMAL HIGH (ref <18)

## 2020-05-13 LAB — MAGNESIUM: Magnesium: 1.8 mg/dL (ref 1.7–2.4)

## 2020-05-13 LAB — PHOSPHORUS: Phosphorus: 1.4 mg/dL — ABNORMAL LOW (ref 2.5–4.6)

## 2020-05-13 MED ORDER — PANTOPRAZOLE SODIUM 40 MG PO TBEC
40.0000 mg | DELAYED_RELEASE_TABLET | Freq: Every day | ORAL | Status: DC
  Administered 2020-05-13 – 2020-05-14 (×2): 40 mg via ORAL
  Filled 2020-05-13 (×2): qty 1

## 2020-05-13 MED ORDER — IPRATROPIUM-ALBUTEROL 0.5-2.5 (3) MG/3ML IN SOLN
3.0000 mL | Freq: Three times a day (TID) | RESPIRATORY_TRACT | Status: DC
  Administered 2020-05-13 (×2): 3 mL via RESPIRATORY_TRACT
  Filled 2020-05-13 (×2): qty 3

## 2020-05-13 MED ORDER — K PHOS MONO-SOD PHOS DI & MONO 155-852-130 MG PO TABS
500.0000 mg | ORAL_TABLET | Freq: Two times a day (BID) | ORAL | Status: DC
  Administered 2020-05-13 – 2020-05-14 (×4): 500 mg via ORAL
  Filled 2020-05-13 (×4): qty 2

## 2020-05-13 MED ORDER — IOHEXOL 350 MG/ML SOLN
75.0000 mL | Freq: Once | INTRAVENOUS | Status: AC | PRN
  Administered 2020-05-13: 75 mL via INTRAVENOUS

## 2020-05-13 MED ORDER — POTASSIUM CHLORIDE IN NACL 40-0.9 MEQ/L-% IV SOLN: INTRAVENOUS | Status: DC

## 2020-05-13 MED ORDER — MAGNESIUM SULFATE 2 GM/50ML IV SOLN
2.0000 g | Freq: Once | INTRAVENOUS | Status: AC
  Administered 2020-05-13: 2 g via INTRAVENOUS
  Filled 2020-05-13: qty 50

## 2020-05-13 MED ORDER — ATORVASTATIN CALCIUM 40 MG PO TABS
80.0000 mg | ORAL_TABLET | Freq: Every day | ORAL | Status: DC
  Administered 2020-05-13 – 2020-05-14 (×2): 80 mg via ORAL
  Filled 2020-05-13 (×2): qty 2

## 2020-05-13 MED ORDER — IPRATROPIUM-ALBUTEROL 0.5-2.5 (3) MG/3ML IN SOLN
3.0000 mL | Freq: Three times a day (TID) | RESPIRATORY_TRACT | Status: DC
  Administered 2020-05-14 (×2): 3 mL via RESPIRATORY_TRACT
  Filled 2020-05-13 (×3): qty 3

## 2020-05-13 NOTE — Progress Notes (Signed)
Informed by Central Telemetry that patient had a 13 beat run of VTach.   Vital signs are as follows:    05/13/20 0750  Vitals  Temp 99.3 F (37.4 C)  Temp Source Oral  BP (!) 141/88  BP Location Left Arm  BP Method Automatic  Patient Position (if appropriate) Lying  Pulse Rate 91  Pulse Rate Source Dinamap  Resp 16  Level of Consciousness  Level of Consciousness Alert  MEWS COLOR  MEWS Score Color Green  Oxygen Therapy  SpO2 94 %  O2 Device Room Air  Pain Assessment  Pain Scale 0-10  Pain Score 10  Pain Type Acute pain  Pain Location Chest  Pain Orientation Mid  Pain Descriptors / Indicators Aching  Pain Frequency Intermittent  Pain Onset Gradual  Patients Stated Pain Goal 0  Pain Intervention(s) MD notified (Comment);Emotional support;Environmental changes;Rest;Relaxation  Multiple Pain Sites Yes  2nd Pain Site  Pain Score 10  Pain Type Acute pain  Pain Location Abdomen  Pain Orientation Mid  Pain Descriptors / Indicators Aching;Discomfort  Pain Frequency Constant  Pain Onset On-going  Patient's Stated Pain Goal 0  Pain Intervention(s) Environmental changes;Emotional support;MD notified (Comment);Rest;Relaxation  POSS Scale (Pasero Opioid Sedation Scale)  POSS *See Group Information* 1-Acceptable,Awake and alert  MEWS Score  MEWS Temp 0  MEWS Systolic 0  MEWS Pulse 0  MEWS RR 0  MEWS LOC 0  MEWS Score 0   Patient complains of  Mid sternal chest pain and abdominal pain at surgical site. Surgical site appears normal no active bleeding. Dressings are intact and dry. MD has been notified and orders for Troponin High sensitivity has been placed and 12 Lead EKG stat has been completed. Patient currently resting comfortably in bed at this time.

## 2020-05-13 NOTE — Progress Notes (Signed)
Patient verbally aggressive towards staff.   Patient is yelling, cursing, and physically threatening staff. States he is not getting his pain medication.  Patient states he feels as if staff is stealing his pain medication and selling it.  Looked at patients [facility] and stated when he had pain medication.  Patient did not believe staff and stated we were lying.  Notified supervisor of patient behavior.

## 2020-05-13 NOTE — Progress Notes (Signed)
  Echocardiogram 2D Echocardiogram has been performed.  Tye Savoy 05/13/2020, 12:56 PM

## 2020-05-13 NOTE — Consult Note (Signed)
History and Physical  Brian Ford BHA:[id number] DOB: 1966-06-17 DOA: 05/11/2020   PCP: Patient, No Pcp Per   Reason for consult: medical management   HPI:  Brian Ford is a 54 y.o. male with medical history of coronary artery disease status post MI with BMS to the LAD July 2019, systolic and diastolic CHF, tobacco abuse, cocaine abuse, COPD, hypertension, hyperlipidemia presenting with 3-day history of abdominal pain with nausea and vomiting.  The patient was initially seen in the ED on 05/10/2020 with abdominal pain.  A CT of the abdomen and pelvis was performed at that time and showed nondilated fluid-filled small bowel.  The patient was treated medically and improved.  He was discharged home.  He continued to have abdominal pain with nausea and vomiting.  He returned to the ED on 05/11/2020.  CTA of the abdomen and pelvis and chest at that time showed a twisted small bowel with closed-loop obstruction with both multiple loops of small bowel that were edematous and poorly enhancing concerning for ischemia.  General surgery was consulted.  The patient was taken to surgery and underwent exploratory laparotomy with partial resection of his small bowel.  Notably, the patient had been using cocaine on the day prior to admission.  He continues to smoke.  He states that he has been only taking his aspirin and Brilinta "as needed".  Unfortunately, he has been essentially lost to follow-up from the cardiology standpoint. Postoperatively, the patient had chest discomfort as well as nonsustained ventricular tachycardia on telemetry.  As result, internal medicine consult was obtained. The patient states that he has been having intermittent chest discomfort for nearly 1 year now.  He is unable to clarify any exacerbating or alleviating factors.  He does endorse continued smoking and cocaine use.  He states he is having some dyspnea on exertion.  He denies any hemoptysis.  He has some nausea without  emesis.  Postoperatively, he has not passed any flatus or had a bowel movement.  BMP shows sodium 132, potassium 3.3, serum creatinine 0.87.  WBC 13.6, hemoglobin 12.5, platelets 156,000.  Initial troponin was 29.  EKG showed sinus rhythm with IVCD and LVH repolarization abnormalities.  Assessment/Plan: Chest pain in a patient with coronary artery disease -Finished cycling troponins -Repeat echocardiogram -Check D-dimer -Obtain chest x-ray -Personally reviewed EKG--sinus rhythm, LVH repolarization abnormalities -Continue carvedilol -Restart aspirin and Brilinta when cleared by general surgery  Nonsustained ventricular tachycardia -Optimize electrolytes particularly potassium and magnesium -Give additional 2 g magnesium -Optimize potassium with a goal >4  Ischemic small bowel -s/p ex lap with partial SB resection -postop care per general surgery  Hypokalemia -Replete -Magnesium 1.8  Hyponatremia -Secondary to poor solute intake and volume depletion -Continue normal saline judiciously  Chronic systolic and diastolic CHF/ischemic cardiomyopathy -11/21/2017 echo EF 20-25%, diffuse HK, grade 1 DD, AK anterior septal, mild AI -Continue carvedilol  Hyperlipidemia -Continue statin  COPD/tobacco abuse -Tobacco cessation discussed -Stable on room air -Start duo nebs  Cocaine abuse -UDS positive for cocaine -Cessation discussed  Hypophosphotemia -replete       Past Medical History:  Diagnosis Date  . Anxiety   . CAD (coronary artery disease) 2012   a. PCI of 100% RCA (2012) b. s/p BMS to LAD c. 02/2018: Anterior STEMI with DES to LAD; CTO of RCA with collaterals present.   . Crack cocaine use   . Depression   . GERD (gastroesophageal reflux disease)   . Hyperlipidemia   .  Hypertension   . Normocytic anemia   . ST elevation myocardial infarction (STEMI) of inferior wall [facility])    Past Surgical History:  Procedure Laterality Date  . CORONARY ANGIOPLASTY    .  CORONARY/GRAFT ACUTE MI REVASCULARIZATION N/A 03/03/2018   Procedure: Coronary/Graft Acute MI Revascularization;  Surgeon: Tonny Bollman, MD;  Location: [facility] INVASIVE CV LAB;  Service: Cardiovascular;  Laterality: N/A;  . LEFT HEART CATH AND CORONARY ANGIOGRAPHY N/A 03/03/2018   Procedure: LEFT HEART CATH AND CORONARY ANGIOGRAPHY;  Surgeon: Tonny Bollman, MD;  Location: [facility] INVASIVE CV LAB;  Service: Cardiovascular;  Laterality: N/A;  . LEFT HEART CATHETERIZATION WITH CORONARY ANGIOGRAM N/A 05/19/2014   Procedure: LEFT HEART CATHETERIZATION WITH CORONARY ANGIOGRAM;  Surgeon: Marykay Lex, MD;  Location: [facility] CATH LAB;  Service: Cardiovascular;  Laterality: N/A;  . NOSE SURGERY    . PERCUTANEOUS CORONARY STENT INTERVENTION (PCI-S)  05/19/2014   Procedure: PERCUTANEOUS CORONARY STENT INTERVENTION (PCI-S);  Surgeon: Marykay Lex, MD;  Location: [facility] CATH LAB;  Service: Cardiovascular;;  BMS to Mid LAD   Social History:  reports that he has been smoking cigarettes. He has a 35.00 pack-year smoking history. He has never used smokeless tobacco. He reports current drug use. Drugs: "Crack" cocaine, Oxycodone, Benzodiazepines, Cocaine, and Marijuana. He reports that he does not drink alcohol.   Family History  Problem Relation Age of Onset  . Hypertension Mother   . Heart disease Mother   . Stroke Mother   . Diabetes Sister   . Diabetes Brother      No Known Allergies   Prior to Admission medications   Medication Sig Start Date End Date Taking? Authorizing Provider  carvedilol (COREG) 3.125 MG tablet Take 1 tablet (3.125 mg total) by mouth 2 (two) times daily. 08/27/19 08/26/20 Yes Jonelle Sidle, MD  ticagrelor (BRILINTA) 90 MG TABS tablet Take 1 tablet (90 mg total) by mouth 2 (two) times daily. 03/12/18  Yes Strader, Lennart Pall, PA-C  aspirin EC 81 MG tablet Take 1 tablet (81 mg total) by mouth daily. Patient not taking: Reported on 05/10/2020 03/04/18   Azalee Course, PA  atorvastatin (LIPITOR)  80 MG tablet Take 1 tablet (80 mg total) by mouth daily at 6 PM. Patient not taking: Reported on 05/10/2020 08/27/19   Jonelle Sidle, MD  HYDROcodone-acetaminophen (NORCO/VICODIN) 5-325 MG tablet Take 1 tablet by mouth every 6 (six) hours as needed for moderate pain. 05/11/20   Vanetta Mulders, MD  nitroGLYCERIN (NITROSTAT) 0.4 MG SL tablet Place 1 tablet (0.4 mg total) under the tongue every 5 (five) minutes x 3 doses as needed for chest pain. 03/12/18   Strader, Lennart Pall, PA-C  ondansetron (ZOFRAN) 4 MG tablet Take 1 tablet (4 mg total) by mouth every 8 (eight) hours as needed for nausea or vomiting. 05/11/20   Vanetta Mulders, MD    Review of Systems:  Constitutional:  No weight loss, night sweats, Fevers, chills, fatigue.  Head&Eyes: No headache.  No vision loss.  No eye pain or scotoma ENT:  No Difficulty swallowing,Tooth/dental problems,Sore throat,  No ear ache, post nasal drip,  Cardio-vascular:  No  Orthopnea, PND, swelling in lower extremities,  dizziness, palpitations  GI:  No  abdominal pain,vomiting, diarrhea, loss of appetite, hematochezia, melena, heartburn, indigestion, Resp:  No shortness of breath with exertion or at rest. No cough. No coughing up of blood .No wheezing.No chest wall deformity  Skin:  no rash or lesions.  GU:  no dysuria, change in color  of urine, no urgency or frequency. No flank pain.  Musculoskeletal:  No joint pain or swelling. No decreased range of motion. No back pain.  Psych:  No change in mood or affect. No depression or anxiety. Neurologic: No headache, no dysesthesia, no focal weakness, no vision loss. No syncope  Physical Exam: Vitals:   05/12/20 1333 05/12/20 2009 05/13/20 0531 05/13/20 0750  BP: 116/71 127/77 124/80 (!) 141/88  Pulse: 73 86 91 91  Resp: 20 17 16 16   Temp: 98.4 F (36.9 C) 99 F (37.2 C) 99.6 F (37.6 C) 99.3 F (37.4 C)  TempSrc: Oral Oral Oral Oral  SpO2: 95% 96% 92% 94%  Weight:      Height:        General:  A&O x 3, NAD, nontoxic, pleasant/cooperative Head/Eye: No conjunctival hemorrhage, no icterus, [place]/AT, No nystagmus ENT:  No icterus,  No thrush, good dentition, no pharyngeal exudate Neck:  No masses, no lymphadenpathy, no bruits CV:  RRR, no rub, no gallop, no S3 Lung:  Bibasilar rales. No wheeze Abdomen: soft/NT, +BS, nondistended, no peritoneal signs Ext: No cyanosis, No rashes, No petechiae, No lymphangitis, No edema Neuro: CNII-XII intact, strength 4/5 in bilateral upper and lower extremities, no dysmetria  Labs on Admission:  Basic Metabolic Panel: Recent Labs  Lab 05/10/20 1055 05/10/20 1055 05/11/20 0945 05/11/20 0945 05/12/20 0503 05/13/20 0630  NA 137  --  138  --  139 132*  K 4.5   < > 3.5   < > 3.7 3.3*  CL 102  --  102  --  107 100  CO2 26  --  24  --  24 24  GLUCOSE 110*  --  156*  --  70 76  BUN 15  --  19  --  19 12  CREATININE 1.28*  --  1.10  --  0.98 0.87  CALCIUM 9.2  --  9.2  --  7.7* 7.7*  MG  --   --   --   --  1.8 1.8  PHOS  --   --   --   --  2.7 1.4*   < > = values in this interval not displayed.   Liver Function Tests: Recent Labs  Lab 05/10/20 1055 05/11/20 0945  AST 27 21  ALT 14 15  ALKPHOS 69 69  BILITOT 1.0 1.0  PROT 7.6 7.0  ALBUMIN 3.8 3.9   Recent Labs  Lab 05/10/20 1055 05/11/20 0945  LIPASE 44 24   No results for input(s): AMMONIA in the last 168 hours. CBC: Recent Labs  Lab 05/10/20 1055 05/11/20 0945 05/12/20 0503 05/13/20 0630  WBC 9.6 20.6* 10.3 13.6*  NEUTROABS  --  18.4*  --   --   HGB 16.1 16.2 12.8* 12.5*  HCT 48.8 48.7 38.6* 37.8*  MCV 86.1 84.4 87.1 84.8  PLT 251 326 155 156   Coagulation Profile: No results for input(s): INR, PROTIME in the last 168 hours. Cardiac Enzymes: No results for input(s): CKTOTAL, CKMB, CKMBINDEX, TROPONINI in the last 168 hours. BNP: Invalid input(s): POCBNP CBG: No results for input(s): GLUCAP in the last 168 hours. Urine analysis:    Component Value  Date/Time   COLORURINE AMBER (A) 05/11/2020 0927   APPEARANCEUR HAZY (A) 05/11/2020 0927   LABSPEC 1.033 (H) 05/11/2020 0927   PHURINE 5.0 05/11/2020 0927   GLUCOSEU 50 (A) 05/11/2020 0927   HGBUR LARGE (A) 05/11/2020 0927   BILIRUBINUR NEGATIVE 05/11/2020 0927   KETONESUR 5 (  A) 05/11/2020 0927   PROTEINUR 100 (A) 05/11/2020 0927   NITRITE POSITIVE (A) 05/11/2020 0927   LEUKOCYTESUR NEGATIVE 05/11/2020 0927   Sepsis Labs: @LABRCNTIP (procalcitonin:4,lacticidven:4) ) Recent Results (from the past 240 hour(s))  Urine Culture     Status: None   Collection Time: 05/10/20 11:54 PM   Specimen: Urine, Clean Catch  Result Value Ref Range Status   Specimen Description   Final    URINE, CLEAN CATCH Performed at [facility], [address], [place], [place]    Special Requests   Final    NONE Performed at [facility], [address], [place], [place]    Culture   Final    NO GROWTH Performed at [facility], [address], [place], [place]    Report Status 05/12/2020 FINAL  Final  Resp Panel by RT PCR (RSV, Flu A&B, Covid) - Nasopharyngeal Swab     Status: None   Collection Time: 05/11/20 11:00 AM   Specimen: Nasopharyngeal Swab  Result Value Ref Range Status   SARS Coronavirus 2 by RT PCR NEGATIVE NEGATIVE Final    Comment: (NOTE) SARS-CoV-2 target nucleic acids are NOT DETECTED.  The SARS-CoV-2 RNA is generally detectable in upper respiratoy specimens during the acute phase of infection. The lowest concentration of SARS-CoV-2 viral copies this assay can detect is 131 copies/mL. A negative result does not preclude SARS-Cov-2 infection and should not be used as the sole basis for treatment or other patient management decisions. A negative result may occur with  improper specimen collection/handling, submission of specimen other than nasopharyngeal swab, presence of viral mutation(s) within the areas targeted by this assay, and inadequate  number of viral copies (<131 copies/mL). A negative result must be combined with clinical observations, patient history, and epidemiological information. The expected result is Negative.  Fact Sheet for Patients:  07/11/20  Fact Sheet for Healthcare Providers:  https://www.moore.com/  This test is no t yet approved or cleared by the https://www.young.biz/ FDA and  has been authorized for detection and/or diagnosis of SARS-CoV-2 by FDA under an Emergency Use Authorization (EUA). This EUA will remain  in effect (meaning this test can be used) for the duration of the COVID-19 declaration under Section 564(b)(1) of the Act, 21 U.S.C. section 360bbb-3(b)(1), unless the authorization is terminated or revoked sooner.     Influenza A by PCR NEGATIVE NEGATIVE Final   Influenza B by PCR NEGATIVE NEGATIVE Final    Comment: (NOTE) The Xpert Xpress SARS-CoV-2/FLU/RSV assay is intended as an aid in  the diagnosis of influenza from Nasopharyngeal swab specimens and  should not be used as a sole basis for treatment. Nasal washings and  aspirates are unacceptable for Xpert Xpress SARS-CoV-2/FLU/RSV  testing.  Fact Sheet for Patients: [place]  Fact Sheet for Healthcare Providers: https://www.moore.com/  This test is not yet approved or cleared by the https://www.young.biz/ FDA and  has been authorized for detection and/or diagnosis of SARS-CoV-2 by  FDA under an Emergency Use Authorization (EUA). This EUA will remain  in effect (meaning this test can be used) for the duration of the  Covid-19 declaration under Section 564(b)(1) of the Act, 21  U.S.C. section 360bbb-3(b)(1), unless the authorization is  terminated or revoked.    Respiratory Syncytial Virus by PCR NEGATIVE NEGATIVE Final    Comment: (NOTE) Fact Sheet for Patients: [place]  Fact Sheet for Healthcare  Providers: https://www.moore.com/  This test is not yet approved or cleared by the  [place] [employer] and  has been authorized for detection and/or diagnosis of SARS-CoV-2 by  FDA under an TEFL teacher (EUA). This EUA will remain  in effect (meaning this test can be used) for the duration of the  COVID-19 declaration under Section 564(b)(1) of the Act, 21 U.S.C.  section 360bbb-3(b)(1), unless the authorization is terminated or  revoked. Performed at [facility], [address] [place], [place] [address]      Radiological Exams on Admission: DG Chest Portable 1 View  Result Date: 05/11/2020 CLINICAL DATA:  Nasogastric tube placement. EXAM: PORTABLE CHEST 1 VIEW COMPARISON:  Same day. FINDINGS: The heart size and mediastinal contours are within normal limits. Both lungs are clear. Nasogastric tube is kinked with distal tip in mid esophagus. The visualized skeletal structures are unremarkable. IMPRESSION: Nasogastric tube is kinked with distal tip in mid esophagus. No acute cardiopulmonary abnormality seen. Electronically Signed   By: Lupita Raider M.D.   On: 05/11/2020 12:14   CT Angio Chest/Abd/Pel for Dissection W and/or W/WO  Result Date: 05/11/2020 CLINICAL DATA:  Abdominal pain with concern for aortic dissection or mesenteric ischemia. EXAM: CT ANGIOGRAPHY CHEST, ABDOMEN AND PELVIS TECHNIQUE: Noncontrast CT was first performed. Multidetector CT imaging through the chest, abdomen and pelvis was performed using the standard protocol during bolus administration of intravenous contrast. Multiplanar reconstructed images and MIPs were obtained and reviewed to evaluate the vascular anatomy. CONTRAST:  80mL OMNIPAQUE IOHEXOL 350 MG/ML SOLN COMPARISON:  Abdomen and pelvis CT from yesterday FINDINGS: CTA CHEST FINDINGS Cardiovascular: Preferential opacification of the thoracic aorta. No evidence of thoracic aortic aneurysm or dissection. Normal heart size. No  pericardial effusion. Coronary atherosclerosis with probable prior stenting. There is subendocardial fatty deposition at the left ventricle from remote infarct. The left ventricle appears dilated. The pulmonary arteries are not opacified on this systemic arterially timed study. Atherosclerosis of the aorta and proximal great vessel Mediastinum/Nodes: No adenopathy or mass. Lungs/Pleura: Paraseptal emphysema. Peripheral ground-glass opacity in the left upper lobe measuring 1 cm on 7:67. Subpleural nodules in the lateral right middle lobe measuring up to 4 mm. Numerous additional small pulmonary nodules are noted in the bilateral lung and demarcated on axial lung windows and PACS created MIPS. There is no edema, consolidation, effusion, or pneumothorax. Musculoskeletal: No acute or aggressive finding Review of the MIP images confirms the above findings. CTA ABDOMEN AND PELVIS FINDINGS VASCULAR Aorta: Diffuse atheromatous plaque.  No dissection or aneurysm. Celiac: Patent without evidence of aneurysm, dissection, vasculitis or significant stenosis. SMA: Diffusely attenuated peripheral branches associated with the loops of edematous bowel, extrinsic narrowing rather than primary vascular process Renals: Single left and 3 right renal arteries without branch occlusion, beading, or aneurysm. IMA: Patent Inflow: Atheromatous plaque. Veins: Negative Review of the MIP images confirms the above findings. NON-VASCULAR Hepatobiliary: No focal liver abnormality.No evidence of biliary obstruction or stone. Detection of calculi is limited due to biliary excretion of contrast from yesterday study. Pancreas: Unremarkable. Spleen: Unremarkable. Adrenals/Urinary Tract: Negative adrenals. No hydronephrosis or stone. Unremarkable bladder. Stomach/Bowel: There is clustered fluid-filled and edematous bowel in the central abdomen affecting multiple loops. The most severely thickened, along the right flank show no detectable mucosal or  serosal enhancement. There is distortion of the mesentery suggesting underlying adhesion or internal hernia. Lymphatic: No mass or adenopathy. Reproductive:No pathologic findings. Other: Small volume reactive ascites.  No pneumoperitoneum. Musculoskeletal: No acute abnormalities. Critical Value/emergent results were called by telephone at the time of interpretation on 05/11/2020 at  10:57 am to provider JULIE IDOL , who verbally acknowledged these results. Review of the MIP images confirms the above findings. IMPRESSION: 1. Twisted small-bowel with closed loop obstruction. Multiple loops are very edematous and poorly enhancing, ischemic appearing. No primary vascular cause is seen. 2. 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 Remote subendocardial left ventricular infarct. 3. Small pulmonary nodules, some not seen in 2018, including a ground-glass left upper lobe nodule measuring 1 cm. Recommend follow-up noncontrast chest CT in 6-12 months. Electronically Signed   By: Marnee Spring M.D.   On: 05/11/2020 11:00    EKG: Independently reviewed. Sinus, LVH repolarization changes    Time spent:60 minutes Code Status:   FULL Family Communication:  No Family at bedside Consults called: general surgery is primary DVT Prophylaxis: SCDs  Dov Dill, DO  [facility] Pager [phone]  If 7PM-7AM, please contact night-coverage [email or web address] Password [email or web address] 05/13/2020, 10:30 AM

## 2020-05-13 NOTE — Progress Notes (Signed)
2 Days Post-Op  Subjective: Patient had an episode of chest pain with a 13 beat run of V. tach.  He is actually complaining of abdominal incisional pain.  No nausea or vomiting are noted.  He is wanting to leave the hospital in order to smoke.  Objective: Vital signs in last 24 hours: Temp:  [98.4 F (36.9 C)-99.6 F (37.6 C)] 99.3 F (37.4 C) (10/09 0750) Pulse Rate:  [73-91] 91 (10/09 0750) Resp:  [16-20] 16 (10/09 0750) BP: (116-141)/(71-88) 141/88 (10/09 0750) SpO2:  [92 %-97 %] 94 % (10/09 0750)    Intake/Output from previous day: 10/08 0701 - 10/09 0700 In: 1597.5 [I.V.:1597.5] Out: 700 [Urine:700] Intake/Output this shift: No intake/output data recorded.  General appearance: alert and combative Resp: clear to auscultation bilaterally Cardio: regular rate and rhythm, S1, S2 normal, no murmur, click, rub or gallop GI: Soft, flat.  Incision healing well.  Minimal bowel sounds appreciated.  Lab Results:  Recent Labs    05/12/20 0503 05/13/20 0630  WBC 10.3 13.6*  HGB 12.8* 12.5*  HCT 38.6* 37.8*  PLT 155 156   BMET Recent Labs    05/12/20 0503 05/13/20 0630  NA 139 132*  K 3.7 3.3*  CL 107 100  CO2 24 24  GLUCOSE 70 76  BUN 19 12  CREATININE 0.98 0.87  CALCIUM 7.7* 7.7*   PT/INR No results for input(s): LABPROT, INR in the last 72 hours.  Studies/Results: DG Chest Portable 1 View  Result Date: 05/11/2020 CLINICAL DATA:  Nasogastric tube placement. EXAM: PORTABLE CHEST 1 VIEW COMPARISON:  Same day. FINDINGS: The heart size and mediastinal contours are within normal limits. Both lungs are clear. Nasogastric tube is kinked with distal tip in mid esophagus. The visualized skeletal structures are unremarkable. IMPRESSION: Nasogastric tube is kinked with distal tip in mid esophagus. No acute cardiopulmonary abnormality seen. Electronically Signed   By: Lupita Raider M.D.   On: 05/11/2020 12:14   CT Angio Chest/Abd/Pel for Dissection W and/or W/WO  Result  Date: 05/11/2020 CLINICAL DATA:  Abdominal pain with concern for aortic dissection or mesenteric ischemia. EXAM: CT ANGIOGRAPHY CHEST, ABDOMEN AND PELVIS TECHNIQUE: Noncontrast CT was first performed. Multidetector CT imaging through the chest, abdomen and pelvis was performed using the standard protocol during bolus administration of intravenous contrast. Multiplanar reconstructed images and MIPs were obtained and reviewed to evaluate the vascular anatomy. CONTRAST:  69mL OMNIPAQUE IOHEXOL 350 MG/ML SOLN COMPARISON:  Abdomen and pelvis CT from yesterday FINDINGS: CTA CHEST FINDINGS Cardiovascular: Preferential opacification of the thoracic aorta. No evidence of thoracic aortic aneurysm or dissection. Normal heart size. No pericardial effusion. Coronary atherosclerosis with probable prior stenting. There is subendocardial fatty deposition at the left ventricle from remote infarct. The left ventricle appears dilated. The pulmonary arteries are not opacified on this systemic arterially timed study. Atherosclerosis of the aorta and proximal great vessel Mediastinum/Nodes: No adenopathy or mass. Lungs/Pleura: Paraseptal emphysema. Peripheral ground-glass opacity in the left upper lobe measuring 1 cm on 7:67. Subpleural nodules in the lateral right middle lobe measuring up to 4 mm. Numerous additional small pulmonary nodules are noted in the bilateral lung and demarcated on axial lung windows and PACS created MIPS. There is no edema, consolidation, effusion, or pneumothorax. Musculoskeletal: No acute or aggressive finding Review of the MIP images confirms the above findings. CTA ABDOMEN AND PELVIS FINDINGS VASCULAR Aorta: Diffuse atheromatous plaque.  No dissection or aneurysm. Celiac: Patent without evidence of aneurysm, dissection, vasculitis or significant stenosis. SMA:  Diffusely attenuated peripheral branches associated with the loops of edematous bowel, extrinsic narrowing rather than primary vascular process  Renals: Single left and 3 right renal arteries without branch occlusion, beading, or aneurysm. IMA: Patent Inflow: Atheromatous plaque. Veins: Negative Review of the MIP images confirms the above findings. NON-VASCULAR Hepatobiliary: No focal liver abnormality.No evidence of biliary obstruction or stone. Detection of calculi is limited due to biliary excretion of contrast from yesterday study. Pancreas: Unremarkable. Spleen: Unremarkable. Adrenals/Urinary Tract: Negative adrenals. No hydronephrosis or stone. Unremarkable bladder. Stomach/Bowel: There is clustered fluid-filled and edematous bowel in the central abdomen affecting multiple loops. The most severely thickened, along the right flank show no detectable mucosal or serosal enhancement. There is distortion of the mesentery suggesting underlying adhesion or internal hernia. Lymphatic: No mass or adenopathy. Reproductive:No pathologic findings. Other: Small volume reactive ascites.  No pneumoperitoneum. Musculoskeletal: No acute abnormalities. Critical Value/emergent results were called by telephone at the time of interpretation on 05/11/2020 at 10:57 am to provider JULIE IDOL , who verbally acknowledged these results. Review of the MIP images confirms the above findings. IMPRESSION: 1. Twisted small-bowel with closed loop obstruction. Multiple loops are very edematous and poorly enhancing, ischemic appearing. No primary vascular cause is seen. 2. 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 Remote subendocardial left ventricular infarct. 3. Small pulmonary nodules, some not seen in 2018, including a ground-glass left upper lobe nodule measuring 1 cm. Recommend follow-up noncontrast chest CT in 6-12 months. Electronically Signed   By: Marnee Spring M.D.   On: 05/11/2020 11:00    Anti-infectives: Anti-infectives (From admission, onward)   Start     Dose/Rate Route Frequency Ordered Stop   05/12/20 0600  cefoTEtan (CEFOTAN) 2 g in sodium chloride 0.9 % 100 mL IVPB         2 g 200 mL/hr over 30 Minutes Intravenous On call to O.R. 05/11/20 1304 05/11/20 1410   05/11/20 1000  sulfamethoxazole-trimethoprim (BACTRIM DS) 800-160 MG per tablet 1 tablet        1 tablet Oral  Once 05/11/20 0954 05/11/20 1018      Assessment/Plan: s/p Procedure(s): EXPLORATORY LAPAROTOMY PARTIAL SMALL BOWEL RESECTION Impression: Postoperative day 2, status post emergent partial small bowel resection.  Patient has a longstanding history of noncompliance.  He also has a significant cardiac history with coronary artery disease, status post stent placements in the past with an EF of 30%.  I discussed his situation with his mother who states he does have a history of schizophrenia.  He is stable at the present time.  His troponin is slightly elevated.  I suspect this is secondary to stress.  Have asked the hospitalist to look in on him.  He is on Coreg.  LOS: 2 days    Brian Ford 05/13/2020

## 2020-05-13 NOTE — Progress Notes (Signed)
Throughout shift, patient has made multiple verbal complaints against staff "taking your sweet time with my pain medicine," and "you're late with my pain medicine." This nurse and multiple staff members have explained that pain medication is as needed and will be provided at the patient's request. Patient continues to be verbally aggressive with staff including curse words, and yelling at people as they pass by in the hallway. Verbal threat was made to this nurse; Rudie Meyer, [facility] made aware and Johnsie Cancel, RN charge nurse made aware. Behavior was discussed with patient with family member present. Patient continues to request pain medication but has not made any more verbal threats to staff. Will continue to monitor.

## 2020-05-14 LAB — COMPREHENSIVE METABOLIC PANEL
ALT: 14 U/L (ref 0–44)
AST: 18 U/L (ref 15–41)
Albumin: 2.3 g/dL — ABNORMAL LOW (ref 3.5–5.0)
Alkaline Phosphatase: 44 U/L (ref 38–126)
Anion gap: 8 (ref 5–15)
BUN: 20 mg/dL (ref 6–20)
CO2: 25 mmol/L (ref 22–32)
Calcium: 7.5 mg/dL — ABNORMAL LOW (ref 8.9–10.3)
Chloride: 103 mmol/L (ref 98–111)
Creatinine, Ser: 0.85 mg/dL (ref 0.61–1.24)
GFR, Estimated: >60 mL/min (ref >60)
Glucose, Bld: 101 mg/dL — ABNORMAL HIGH (ref 70–99)
Potassium: 3.7 mmol/L (ref 3.5–5.1)
Sodium: 136 mmol/L (ref 135–145)
Total Bilirubin: 0.9 mg/dL (ref 0.3–1.2)
Total Protein: 5 g/dL — ABNORMAL LOW (ref 6.5–8.1)

## 2020-05-14 LAB — CBC
HCT: 36 % — ABNORMAL LOW (ref 39.0–52.0)
Hemoglobin: 11.8 g/dL — ABNORMAL LOW (ref 13.0–17.0)
MCH: 27.8 pg (ref 26.0–34.0)
MCHC: 32.8 g/dL (ref 30.0–36.0)
MCV: 84.7 fL (ref 80.0–100.0)
Platelets: 166 10*3/uL (ref 150–400)
RBC: 4.25 MIL/uL (ref 4.22–5.81)
RDW: 13.1 % (ref 11.5–15.5)
WBC: 12.1 10*3/uL — ABNORMAL HIGH (ref 4.0–10.5)
nRBC: 0 % (ref 0.0–0.2)

## 2020-05-14 LAB — PHOSPHORUS: Phosphorus: 2.2 mg/dL — ABNORMAL LOW (ref 2.5–4.6)

## 2020-05-14 LAB — MAGNESIUM: Magnesium: 2.1 mg/dL (ref 1.7–2.4)

## 2020-05-14 LAB — PROCALCITONIN: Procalcitonin: <0.1 ng/mL

## 2020-05-14 MED ORDER — POTASSIUM CHLORIDE CRYS ER 20 MEQ PO TBCR
20.0000 meq | EXTENDED_RELEASE_TABLET | Freq: Once | ORAL | Status: AC
  Administered 2020-05-14: 20 meq via ORAL
  Filled 2020-05-14: qty 1

## 2020-05-14 MED ORDER — HALOPERIDOL LACTATE 5 MG/ML IJ SOLN
1.0000 mg | Freq: Once | INTRAMUSCULAR | Status: AC
  Administered 2020-05-14: 1 mg via INTRAVENOUS
  Filled 2020-05-14: qty 1

## 2020-05-14 MED ORDER — MAGNESIUM SULFATE IN D5W 1-5 GM/100ML-% IV SOLN
1.0000 g | Freq: Once | INTRAVENOUS | Status: AC
  Administered 2020-05-14: 1 g via INTRAVENOUS
  Filled 2020-05-14 (×2): qty 100

## 2020-05-14 NOTE — Progress Notes (Signed)
Patient called out stating he wants to be transferred to [facility] and wanted the cops  patient states he is in  fear of his safety. AC called and informed. AC called security and [place] PD to talk to patient. Security and [place] PD talked to patient. Charge nurse informed. Will continue to monitor patient throughout shift.

## 2020-05-14 NOTE — Progress Notes (Addendum)
3 Days Post-Op  Subjective: States he had a bowel movement, but nobody saw her.  Has tolerated full liquids well.  Objective: Vital signs in last 24 hours: Temp:  [97.8 F (36.6 C)] 97.8 F (36.6 C) (10/09 2044) Pulse Rate:  [77-80] 80 (10/10 0913) Resp:  [18] 18 (10/09 2045) BP: (108-126)/(70-82) 126/82 (10/10 0913) SpO2:  [90 %-95 %] 95 % (10/10 0828)    Intake/Output from previous day: 10/09 0701 - 10/10 0700 In: 1352.9 [P.O.:960; I.V.:392.9] Out: 3240 [Urine:3240] Intake/Output this shift: No intake/output data recorded.  General appearance: alert, cooperative and no distress Resp: clear to auscultation bilaterally Cardio: regular rate and rhythm, S1, S2 normal, no murmur, click, rub or gallop GI: Soft, bowel sounds present.  Nondistended.  Incision healing well.  Lab Results:  Recent Labs    05/13/20 1054 05/14/20 0641  WBC 13.3* 12.1*  HGB 11.9* 11.8*  HCT 34.8* 36.0*  PLT 147* 166   BMET Recent Labs    05/13/20 0630 05/14/20 0641  NA 132* 136  K 3.3* 3.7  CL 100 103  CO2 24 25  GLUCOSE 76 101*  BUN 12 20  CREATININE 0.87 0.85  CALCIUM 7.7* 7.5*   PT/INR No results for input(s): LABPROT, INR in the last 72 hours.  Studies/Results: CT ANGIO CHEST PE W OR WO CONTRAST  Addendum Date: 05/13/2020   ADDENDUM REPORT: 05/13/2020 15:29 ADDENDUM: Free air is identified in the upper abdomen. Electronically Signed   By: Sherian Rein M.D.   On: 05/13/2020 15:29   Result Date: 05/13/2020 CLINICAL DATA:  Chest pain starting this morning. Elevated D-dimers. Assess for pulmonary embolus. EXAM: CT ANGIOGRAPHY CHEST WITH CONTRAST TECHNIQUE: Multidetector CT imaging of the chest was performed using the standard protocol during bolus administration of intravenous contrast. Multiplanar CT image reconstructions and MIPs were obtained to evaluate the vascular anatomy. CONTRAST:  85mL OMNIPAQUE IOHEXOL 350 MG/ML SOLN COMPARISON:  May 11, 2020 FINDINGS: Cardiovascular:  Satisfactory opacification of the pulmonary arteries to the segmental level. No evidence of pulmonary embolism. Normal heart size. No pericardial effusion. Mediastinum/Nodes: No enlarged mediastinal, hilar, or axillary lymph nodes. Thyroid gland, trachea, and esophagus demonstrate no significant findings. Lungs/Pleura: There are small bilateral pleural effusions with dependent atelectasis of bilateral posterior lung bases. Small subcentimeter nodules are identified in bilateral mid and lung bases unchanged. Hazy ground-glass opacity is identified in the left apex lateral left upper lobe new since prior exam. Upper Abdomen: No acute abnormality. Musculoskeletal: No acute abnormality. Review of the MIP images confirms the above findings. IMPRESSION: 1. No pulmonary embolus. 2. Small bilateral pleural effusions with dependent atelectasis of bilateral posterior lung bases. 3. Hazy ground-glass opacity is identified in the left apex lateral left upper lobe new since prior exam. This is nonspecific but can be seen in atypical infection. Electronically Signed: By: Sherian Rein M.D. On: 05/13/2020 14:37   DG CHEST PORT 1 VIEW  Addendum Date: 05/13/2020   ADDENDUM REPORT: 05/13/2020 15:51 ADDENDUM: These results were called by telephone at the time of interpretation on 05/13/2020 at 3:51 pm to provider Dilon TAT , who verbally acknowledged these results. Electronically Signed   By: Meda Klinefelter MD   On: 05/13/2020 15:51   Result Date: 05/13/2020 CLINICAL DATA:  Pain EXAM: PORTABLE CHEST 1 VIEW COMPARISON:  May 11, 2020. FINDINGS: The cardiomediastinal silhouette is unchanged and enlarged in contour. Small RIGHT pleural effusion. No pneumothorax. New LEFT apical nodular opacity. Bibasilar heterogeneous opacities, likely atelectasis. There is pneumoperitoneum. Multilevel degenerative  changes of the thoracic spine. IMPRESSION: 1. Pneumoperitoneum. This is likely postoperative in etiology given recent surgical  intervention on 10/07. Recommend close attention on follow-up. 2. Small RIGHT pleural effusion with bibasilar heterogeneous opacities, likely atelectasis. 3. LEFT apical nodular opacity, new since prior. This may be infectious or inflammatory in etiology. Attempts to contact provider were initiated by PRA at 3:21 p.m. Electronically Signed: By: Meda Klinefelter MD On: 05/13/2020 15:48   ECHOCARDIOGRAM COMPLETE  Result Date: 05/13/2020    ECHOCARDIOGRAM REPORT   Patient Name:   Brian Ford Date of Exam: 05/13/2020 Medical Rec #:  [id number]      Height:       68.0 in Accession #:    [id number]     Weight:       145.5 lb Date of Birth:  [birth year]     BSA:          1.785 m Patient Age:    53 years       BP:           141/88 mmHg Patient Gender: M              HR:           82 bpm. [facility] Procedure: 2D Echo Indications:    Chest Pain R07.9  History:        Patient has prior history of Echocardiogram examinations, most                 recent 06/23/2018. Previous Myocardial Infarction and CAD; Risk                 Factors:Hypertension, Dyslipidemia and Cocaine Abuse.  Sonographer:    Thurman Coyer RDCS (AE) Referring Phys: [phone] Clair TAT IMPRESSIONS  1. Left ventricular ejection fraction, by estimation, is 20 to 25%. The left ventricle has severely decreased function. The left ventricle demonstrates global hypokinesis. The left ventricular internal cavity size was severely dilated. Left ventricular diastolic parameters are consistent with Grade I diastolic dysfunction (impaired relaxation).  2. Right ventricular systolic function is normal. The right ventricular size is normal.  3. Left atrial size was moderately dilated.  4. The mitral valve is normal in structure. Mild to moderate mitral valve regurgitation. No evidence of mitral stenosis.  5. The aortic valve is tricuspid. Aortic valve regurgitation is mild. No aortic stenosis is present. Aortic regurgitation PHT measures 572 msec.  FINDINGS  Left Ventricle: Left ventricular ejection fraction, by estimation, is 20 to 25%. The left ventricle has severely decreased function. The left ventricle demonstrates global hypokinesis. The left ventricular internal cavity size was severely dilated. There is no left ventricular hypertrophy. Left ventricular diastolic parameters are consistent with Grade I diastolic dysfunction (impaired relaxation). Right Ventricle: The right ventricular size is normal. No increase in right ventricular wall thickness. Right ventricular systolic function is normal. Left Atrium: Left atrial size was moderately dilated. Right Atrium: Right atrial size was normal in size. Pericardium: There is no evidence of pericardial effusion. Mitral Valve: The mitral valve is normal in structure. Mild to moderate mitral valve regurgitation. No evidence of mitral valve stenosis. Tricuspid Valve: The tricuspid valve is normal in structure. Tricuspid valve regurgitation is mild . No evidence of tricuspid stenosis. Aortic Valve: The aortic valve is tricuspid. Aortic valve regurgitation is mild. Aortic regurgitation PHT measures 572 msec. No aortic stenosis is present. Pulmonic Valve: The pulmonic valve was normal in structure. Pulmonic valve regurgitation is trivial. No evidence  of pulmonic stenosis. Aorta: The aortic root is normal in size and structure. Venous: The inferior vena cava was not well visualized. IAS/Shunts: No atrial level shunt detected by color flow Doppler.  LEFT VENTRICLE PLAX 2D LVIDd:         6.81 cm LVIDs:         5.78 cm LV PW:         1.16 cm LV IVS:        0.88 cm LVOT diam:     2.40 cm LV SV:         71 LV SV Index:   40 LVOT Area:     4.52 cm  RIGHT VENTRICLE RV S prime:     13.20 cm/s TAPSE (M-mode): 2.2 cm LEFT ATRIUM             Index       RIGHT ATRIUM           Index LA diam:        4.00 cm 2.24 cm/m  RA Area:     16.20 cm LA Vol (A2C):   61.5 ml 34.45 ml/m RA Volume:   37.90 ml  21.23 ml/m LA Vol (A4C):    50.9 ml 28.51 ml/m LA Biplane Vol: 56.6 ml 31.70 ml/m  AORTIC VALVE LVOT Vmax:   96.90 cm/s LVOT Vmean:  62.900 cm/s LVOT VTI:    0.158 m AI PHT:      572 msec  AORTA Ao Root diam: 3.30 cm MITRAL VALVE                 TRICUSPID VALVE MV Area (PHT): 4.63 cm      TR Peak grad:   31.8 mmHg MV Decel Time: 164 msec      TR Vmax:        282.00 cm/s MR Peak grad:    91.4 mmHg MR Mean grad:    62.0 mmHg   SHUNTS MR Vmax:         478.00 cm/s Systemic VTI:  0.16 m MR Vmean:        370.0 cm/s  Systemic Diam: 2.40 cm MR PISA:         3.08 cm MR PISA Eff ROA: 21 mm MR PISA Radius:  0.70 cm MV E velocity: 105.00 cm/s MV A velocity: 104.00 cm/s MV E/A ratio:  1.01 Tiffany Gresham md Electronically signed by Chilton Si md Signature Date/Time: 05/13/2020/1:57:30 PM    Final     Anti-infectives: Anti-infectives (From admission, onward)   Start     Dose/Rate Route Frequency Ordered Stop   05/12/20 0600  cefoTEtan (CEFOTAN) 2 g in sodium chloride 0.9 % 100 mL IVPB        2 g 200 mL/hr over 30 Minutes Intravenous On call to O.R. 05/11/20 1304 05/11/20 1410   05/11/20 1000  sulfamethoxazole-trimethoprim (BACTRIM DS) 800-160 MG per tablet 1 tablet        1 tablet Oral  Once 05/11/20 0954 05/11/20 1018      Assessment/Plan: s/p Procedure(s): EXPLORATORY LAPAROTOMY PARTIAL SMALL BOWEL RESECTION Impression: Postoperative day 3.  Bowel sounds now more active.  Patient is threatening to leave AMA.  I told him that if he did that I could not prescribe any narcotics for pain.  I told him I would advance his diet to regular diet.  Should he have a bowel movement, he needs to let the nurse know.  Hopefully he can be discharged in the next 24  to 48 hours.  Cardiac status seems to be stable.  Appreciate Dr. Don Perking input.  Cardiac EF at 20 to 25%.  LOS: 3 days    Brian Ford 05/14/2020

## 2020-05-14 NOTE — Progress Notes (Signed)
No nausea. Complains of pain rated a 10 and has been getting dilaudid as well as ativan which helps with sleep.  Threatens to leave when awake. Complains that he wants real food. Passing gas but no bm

## 2020-05-14 NOTE — Progress Notes (Signed)
PROGRESS NOTE  Brian Ford ZOX:[id number] DOB: 02-26-1966 DOA: 05/11/2020 PCP: Patient, No Pcp Per  Brief History:  54 y.o. male with medical history of coronary artery disease status post MI with BMS to the LAD July 2019, systolic and diastolic CHF, tobacco abuse, cocaine abuse, COPD, hypertension, hyperlipidemia presenting with 3-day history of abdominal pain with nausea and vomiting.  The patient was initially seen in the ED on 05/10/2020 with abdominal pain.  A CT of the abdomen and pelvis was performed at that time and showed nondilated fluid-filled small bowel.  The patient was treated medically and improved.  He was discharged home.  He continued to have abdominal pain with nausea and vomiting.  He returned to the ED on 05/11/2020.  CTA of the abdomen and pelvis and chest at that time showed a twisted small bowel with closed-loop obstruction with both multiple loops of small bowel that were edematous and poorly enhancing concerning for ischemia.  General surgery was consulted.  The patient was taken to surgery and underwent exploratory laparotomy with partial resection of his small bowel.  Notably, the patient had been using cocaine on the day prior to admission.  He continues to smoke.  He states that he has been only taking his aspirin and Brilinta "as needed".  Unfortunately, he has been essentially lost to follow-up from the cardiology standpoint. Postoperatively, the patient had chest discomfort as well as nonsustained ventricular tachycardia on telemetry.  As result, internal medicine consult was obtained. The patient states that he has been having intermittent chest discomfort for nearly 1 year now.  He is unable to clarify any exacerbating or alleviating factors.  He does endorse continued smoking and cocaine use.  He states he is having some dyspnea on exertion.  He denies any hemoptysis.  He has some nausea without emesis.  Postoperatively, he has not passed any flatus or had a  bowel movement.  BMP shows sodium 132, potassium 3.3, serum creatinine 0.87.  WBC 13.6, hemoglobin 12.5, platelets 156,000.  Initial troponin was 29.  EKG showed sinus rhythm with IVCD and LVH repolarization abnormalities.  Assessment/Plan: Chest pain in a patient with coronary artery disease -Finished cycling troponins--trend is flat -10/9 echo--EF 20-25%, G1DD, global HK; mild AI,TR -Check D-dimer--5.39 -05/13/20--CTA chest--no PE;  Sm. Bilateral pleural effusions; left apex GGO; subcm nodules at bases -Personally reviewed EKG--sinus rhythm, LVH repolarization abnormalities -Continue carvedilol -Restart aspirin and Brilinta when cleared by general surgery  Nonsustained ventricular tachycardia -Optimize electrolytes particularly potassium and magnesium -Give additional 1 g magnesium -Optimize potassium with a goal >4  Ischemic small bowel -s/p ex lap with partial SB resection -postop care per general surgery  Hypokalemia -Replete -Magnesium 1.8  Hyponatremia -Secondary to poor solute intake and volume depletion -Continue normal saline judiciously  Chronic systolic and diastolic CHF/ischemic cardiomyopathy -11/21/2017 echo EF 20-25%, diffuse HK, grade 1 DD, AK anterior septal, mild AI -Continue carvedilol  Hyperlipidemia -Continue statin  COPD/tobacco abuse -Tobacco cessation discussed -Stable on room air -Start duo nebs  Cocaine abuse -UDS positive for cocaine -Cessation discussed  Hypophosphotemia -replete     Status is: Inpatient  Remains inpatient appropriate because:IV treatments appropriate due to intensity of illness or inability to take PO   Dispo: The patient is from: Home              Anticipated d/c is to: Home              Anticipated  d/c date is: 1 day              Patient currently is not medically stable to d/c.        Family Communication:   No Family at bedside  Consultants:  General surgery is primary  Code Status:  FULL     DVT Prophylaxis:  [place]elope Lovenox   Procedures: As Listed in Progress Note Above  Antibiotics: None      Subjective: Complain of abd pain.  Patient denies fevers, chills, headache, chest pain, dyspnea,  vomiting, diarrhea,  dysuria, hematuria, hematochezia, and melena. Passing flatus, but no BM today.  Objective: Vitals:   05/14/20 0828 05/14/20 0913 05/14/20 1236 05/14/20 1703  BP:  126/82 130/85   Pulse:  80 79   Resp:   20   Temp:   98.8 F (37.1 C)   TempSrc:   Oral   SpO2: 95%  98% 90%  Weight:      Height:        Intake/Output Summary (Last 24 hours) at 05/14/2020 1801 Last data filed at 05/14/2020 0300 Gross per 24 hour  Intake --  Output 240 ml  Net -240 ml   Weight change:  Exam:   General:  Pt is alert, follows commands appropriately, not in acute distress  HEENT: No icterus, No thrush, No neck mass, /AT  Cardiovascular: RRR, S1/S2, no rubs, no gallops  Respiratory: bibasilar rales. No wheeze  Abdomen: Soft/+BS, diffuse tender, non distended, no guarding  Extremities: No edema, No lymphangitis, No petechiae, No rashes, no synovitis   Data Reviewed: I have personally reviewed following labs and imaging studies Basic Metabolic Panel: Recent Labs  Lab 05/10/20 1055 05/11/20 0945 05/12/20 0503 05/13/20 0630 05/14/20 0641  NA 137 138 139 132* 136  K 4.5 3.5 3.7 3.3* 3.7  CL 102 102 107 100 103  CO2 26 24 24 24 25   GLUCOSE 110* 156* 70 76 101*  BUN 15 19 19 12 20   CREATININE 1.28* 1.10 0.98 0.87 0.85  CALCIUM 9.2 9.2 7.7* 7.7* 7.5*  MG  --   --  1.8 1.8 2.1  PHOS  --   --  2.7 1.4* 2.2*   Liver Function Tests: Recent Labs  Lab 05/10/20 1055 05/11/20 0945 05/14/20 0641  AST 27 21 18   ALT 14 15 14   ALKPHOS 69 69 44  BILITOT 1.0 1.0 0.9  PROT 7.6 7.0 5.0*  ALBUMIN 3.8 3.9 2.3*   Recent Labs  Lab 05/10/20 1055 05/11/20 0945  LIPASE 44 24   No results for input(s): AMMONIA in the last 168 hours. Coagulation Profile: No  results for input(s): INR, PROTIME in the last 168 hours. CBC: Recent Labs  Lab 05/11/20 0945 05/12/20 0503 05/13/20 0630 05/13/20 1054 05/14/20 0641  WBC 20.6* 10.3 13.6* 13.3* 12.1*  NEUTROABS 18.4*  --   --   --   --   HGB 16.2 12.8* 12.5* 11.9* 11.8*  HCT 48.7 38.6* 37.8* 34.8* 36.0*  MCV 84.4 87.1 84.8 85.3 84.7  PLT 326 155 156 147* 166   Cardiac Enzymes: No results for input(s): CKTOTAL, CKMB, CKMBINDEX, TROPONINI in the last 168 hours. BNP: Invalid input(s): POCBNP CBG: No results for input(s): GLUCAP in the last 168 hours. HbA1C: No results for input(s): HGBA1C in the last 72 hours. Urine analysis:    Component Value Date/Time   COLORURINE AMBER (A) 05/11/2020 0927   APPEARANCEUR HAZY (A) 05/11/2020 0927   LABSPEC 1.033 (H) 05/11/2020 2585  PHURINE 5.0 05/11/2020 0927   GLUCOSEU 50 (A) 05/11/2020 0927   HGBUR LARGE (A) 05/11/2020 0927   BILIRUBINUR NEGATIVE 05/11/2020 0927   KETONESUR 5 (A) 05/11/2020 0927   PROTEINUR 100 (A) 05/11/2020 0927   NITRITE POSITIVE (A) 05/11/2020 0927   LEUKOCYTESUR NEGATIVE 05/11/2020 0927   Sepsis Labs: (procalcitonin:4,lacticidven:4) ) Recent Results (from the past 240 hour(s))  Urine Culture     Status: None   Collection Time: 05/10/20 11:54 PM   Specimen: Urine, Clean Catch  Result Value Ref Range Status   Specimen Description   Final    URINE, CLEAN CATCH Performed at [facility], [address], [place], [place] [address]    Special Requests   Final    NONE Performed at [facility], [address], [place], [place] [address]    Culture   Final    NO GROWTH Performed at [facility], [address], [place], [place] [address]    Report Status 05/12/2020 FINAL  Final  Resp Panel by RT PCR (RSV, Flu A&B, Covid) - Nasopharyngeal Swab     Status: None   Collection Time: 05/11/20 11:00 AM   Specimen: Nasopharyngeal Swab  Result Value Ref Range Status   SARS Coronavirus 2 by RT PCR NEGATIVE NEGATIVE  Final    Comment: (NOTE) SARS-CoV-2 target nucleic acids are NOT DETECTED.  The SARS-CoV-2 RNA is generally detectable in upper respiratoy specimens during the acute phase of infection. The lowest concentration of SARS-CoV-2 viral copies this assay can detect is 131 copies/mL. A negative result does not preclude SARS-Cov-2 infection and should not be used as the sole basis for treatment or other patient management decisions. A negative result may occur with  improper specimen collection/handling, submission of specimen other than nasopharyngeal swab, presence of viral mutation(s) within the areas targeted by this assay, and inadequate number of viral copies (<131 copies/mL). A negative result must be combined with clinical observations, patient history, and epidemiological information. The expected result is Negative.  Fact Sheet for Patients:  https://www.moore.com/  Fact Sheet for Healthcare Providers:  https://www.young.biz/  This test is no t yet approved or cleared by the [place] FDA and  has been authorized for detection and/or diagnosis of SARS-CoV-2 by FDA under an Emergency Use Authorization (EUA). This EUA will remain  in effect (meaning this test can be used) for the duration of the COVID-19 declaration under Section 564(b)(1) of the Act, 21 U.S.C. section 360bbb-3(b)(1), unless the authorization is terminated or revoked sooner.     Influenza A by PCR NEGATIVE NEGATIVE Final   Influenza B by PCR NEGATIVE NEGATIVE Final    Comment: (NOTE) The Xpert Xpress SARS-CoV-2/FLU/RSV assay is intended as an aid in  the diagnosis of influenza from Nasopharyngeal swab specimens and  should not be used as a sole basis for treatment. Nasal washings and  aspirates are unacceptable for Xpert Xpress SARS-CoV-2/FLU/RSV  testing.  Fact Sheet for Patients: https://www.moore.com/  Fact Sheet for Healthcare  Providers: https://www.young.biz/  This test is not yet approved or cleared by the [place] FDA and  has been authorized for detection and/or diagnosis of SARS-CoV-2 by  FDA under an Emergency Use Authorization (EUA). This EUA will remain  in effect (meaning this test can be used) for the duration of the  Covid-19 declaration under Section 564(b)(1) of the Act, 21  U.S.C. section 360bbb-3(b)(1), unless the authorization is  terminated or revoked.    Respiratory Syncytial Virus by PCR NEGATIVE NEGATIVE Final  Comment: (NOTE) Fact Sheet for Patients: https://www.moore.com/  Fact Sheet for Healthcare Providers: https://www.young.biz/  This test is not yet approved or cleared by the [place] FDA and  has been authorized for detection and/or diagnosis of SARS-CoV-2 by  FDA under an Emergency Use Authorization (EUA). This EUA will remain  in effect (meaning this test can be used) for the duration of the  COVID-19 declaration under Section 564(b)(1) of the Act, 21 U.S.C.  section 360bbb-3(b)(1), unless the authorization is terminated or  revoked. Performed at [facility], [address] [place], [place] [address]      Scheduled Meds: . atorvastatin  80 mg Oral Daily  . carvedilol  3.125 mg Oral BID  . Chlorhexidine Gluconate Cloth  6 each Topical Daily  . enoxaparin (LOVENOX) injection  40 mg Subcutaneous Q24H  . ipratropium-albuterol  3 mL Nebulization TID  . nicotine  14 mg Transdermal Daily  . pantoprazole  40 mg Oral Q1200  . phosphorus  500 mg Oral BID   Continuous Infusions:  Procedures/Studies: CT ANGIO CHEST PE W OR WO CONTRAST  Addendum Date: 05/13/2020   ADDENDUM REPORT: 05/13/2020 15:29 ADDENDUM: Free air is identified in the upper abdomen. Electronically Signed   By: Sherian Rein M.D.   On: 05/13/2020 15:29   Result Date: 05/13/2020 CLINICAL DATA:  Chest pain starting this morning. Elevated  D-dimers. Assess for pulmonary embolus. EXAM: CT ANGIOGRAPHY CHEST WITH CONTRAST TECHNIQUE: Multidetector CT imaging of the chest was performed using the standard protocol during bolus administration of intravenous contrast. Multiplanar CT image reconstructions and MIPs were obtained to evaluate the vascular anatomy. CONTRAST:  75mL OMNIPAQUE IOHEXOL 350 MG/ML SOLN COMPARISON:  May 11, 2020 FINDINGS: Cardiovascular: Satisfactory opacification of the pulmonary arteries to the segmental level. No evidence of pulmonary embolism. Normal heart size. No pericardial effusion. Mediastinum/Nodes: No enlarged mediastinal, hilar, or axillary lymph nodes. Thyroid gland, trachea, and esophagus demonstrate no significant findings. Lungs/Pleura: There are small bilateral pleural effusions with dependent atelectasis of bilateral posterior lung bases. Small subcentimeter nodules are identified in bilateral mid and lung bases unchanged. Hazy ground-glass opacity is identified in the left apex lateral left upper lobe new since prior exam. Upper Abdomen: No acute abnormality. Musculoskeletal: No acute abnormality. Review of the MIP images confirms the above findings. IMPRESSION: 1. No pulmonary embolus. 2. Small bilateral pleural effusions with dependent atelectasis of bilateral posterior lung bases. 3. Hazy ground-glass opacity is identified in the left apex lateral left upper lobe new since prior exam. This is nonspecific but can be seen in atypical infection. Electronically Signed: By: Sherian Rein M.D. On: 05/13/2020 14:37   CT Abdomen Pelvis W Contrast  Result Date: 05/10/2020 CLINICAL DATA:  Abdominal pain and vomiting. EXAM: CT ABDOMEN AND PELVIS WITH CONTRAST TECHNIQUE: Multidetector CT imaging of the abdomen and pelvis was performed using the standard protocol following bolus administration of intravenous contrast. CONTRAST:  OMNIPAQUE IOHEXOL 300 MG/ML  SOLN COMPARISON:  None. FINDINGS: Lower chest: No acute  abnormality. Hepatobiliary: No focal liver abnormality is seen. No gallstones, gallbladder wall thickening, or biliary dilatation. Pancreas: Unremarkable. No pancreatic ductal dilatation or surrounding inflammatory changes. Spleen: Normal in size without focal abnormality. Adrenals/Urinary Tract: Adrenal glands are unremarkable. Kidneys are normal, without renal calculi, focal lesion, or hydronephrosis. Bladder is unremarkable for the degree of distention. Stomach/Bowel: Stomach is within normal limits. There are few loops of non-dilated fluid-filled small bowel in the right upper abdomen with trace interloop ascites. No obstruction. Normal appendix. Vascular/Lymphatic:  Aortic atherosclerosis. No enlarged abdominal or pelvic lymph nodes. Reproductive: Prostate is unremarkable. Other: No abdominal wall hernia or abnormality. Trace free fluid in the pelvis. No pneumoperitoneum. Musculoskeletal: No acute or significant osseous findings. IMPRESSION: 1. Non-dilated fluid-filled small bowel in the right upper abdomen with trace ascites, suggestive of enteritis. No obstruction. 2. Aortic Atherosclerosis (ICD10-I70.0). Electronically Signed   By: Obie Dredge M.D.   On: 05/10/2020 12:52   DG CHEST PORT 1 VIEW  Addendum Date: 05/13/2020   ADDENDUM REPORT: 05/13/2020 15:51 ADDENDUM: These results were called by telephone at the time of interpretation on 05/13/2020 at 3:51 pm to provider Midas Stanisha Lorenz , who verbally acknowledged these results. Electronically Signed   By: Meda Klinefelter MD   On: 05/13/2020 15:51   Result Date: 05/13/2020 CLINICAL DATA:  Pain EXAM: PORTABLE CHEST 1 VIEW COMPARISON:  May 11, 2020. FINDINGS: The cardiomediastinal silhouette is unchanged and enlarged in contour. Small RIGHT pleural effusion. No pneumothorax. New LEFT apical nodular opacity. Bibasilar heterogeneous opacities, likely atelectasis. There is pneumoperitoneum. Multilevel degenerative changes of the thoracic spine. IMPRESSION:  1. Pneumoperitoneum. This is likely postoperative in etiology given recent surgical intervention on 10/07. Recommend close attention on follow-up. 2. Small RIGHT pleural effusion with bibasilar heterogeneous opacities, likely atelectasis. 3. LEFT apical nodular opacity, new since prior. This may be infectious or inflammatory in etiology. Attempts to contact provider were initiated by PRA at 3:21 p.m. Electronically Signed: By: Meda Klinefelter MD On: 05/13/2020 15:48   DG Chest Portable 1 View  Result Date: 05/11/2020 CLINICAL DATA:  Nasogastric tube placement. EXAM: PORTABLE CHEST 1 VIEW COMPARISON:  Same day. FINDINGS: The heart size and mediastinal contours are within normal limits. Both lungs are clear. Nasogastric tube is kinked with distal tip in mid esophagus. The visualized skeletal structures are unremarkable. IMPRESSION: Nasogastric tube is kinked with distal tip in mid esophagus. No acute cardiopulmonary abnormality seen. Electronically Signed   By: Lupita Raider M.D.   On: 05/11/2020 12:14   DG Chest Port 1 View  Result Date: 05/11/2020 CLINICAL DATA:  Abdominal and back pain since yesterday EXAM: PORTABLE CHEST 1 VIEW COMPARISON:  07/01/2017 chest CT.  Chest x-ray 05/27/2017 FINDINGS: 0936 hours. The lungs are clear without focal pneumonia, edema, pneumothorax or pleural effusion. Interstitial markings are diffusely coarsened with chronic features. The cardiopericardial silhouette is within normal limits for size. The visualized bony structures of the thorax show no acute abnormality. Telemetry leads overlie the chest. Prominent gastric bubble and/or colonic gas under the left hemidiaphragm. IMPRESSION: No acute cardiopulmonary findings. Prominent bowel gas left upper quadrant. Electronically Signed   By: Kennith Center M.D.   On: 05/11/2020 09:53   ECHOCARDIOGRAM COMPLETE  Result Date: 05/13/2020    ECHOCARDIOGRAM REPORT   Patient Name:   [place] SHELLHAMMER Date of Exam: 05/13/2020 Medical  Rec #:  [id number]      Height:       68.0 in Accession #:    [id number]     Weight:       145.5 lb Date of Birth:  May 20, 1966     BSA:          1.785 m Patient Age:    53 years       BP:           141/88 mmHg Patient Gender: M              HR:           82 bpm.  [facility] Procedure: 2D Echo Indications:    Chest Pain R07.9  History:        Patient has prior history of Echocardiogram examinations, most                 recent 06/23/2018. Previous Myocardial Infarction and CAD; Risk                 Factors:Hypertension, Dyslipidemia and Cocaine Abuse.  Sonographer:    Thurman Coyer RDCS (AE) Referring Phys: [phone] Schon Claude Waldman IMPRESSIONS  1. Left ventricular ejection fraction, by estimation, is 20 to 25%. The left ventricle has severely decreased function. The left ventricle demonstrates global hypokinesis. The left ventricular internal cavity size was severely dilated. Left ventricular diastolic parameters are consistent with Grade I diastolic dysfunction (impaired relaxation).  2. Right ventricular systolic function is normal. The right ventricular size is normal.  3. Left atrial size was moderately dilated.  4. The mitral valve is normal in structure. Mild to moderate mitral valve regurgitation. No evidence of mitral stenosis.  5. The aortic valve is tricuspid. Aortic valve regurgitation is mild. No aortic stenosis is present. Aortic regurgitation PHT measures 572 msec. FINDINGS  Left Ventricle: Left ventricular ejection fraction, by estimation, is 20 to 25%. The left ventricle has severely decreased function. The left ventricle demonstrates global hypokinesis. The left ventricular internal cavity size was severely dilated. There is no left ventricular hypertrophy. Left ventricular diastolic parameters are consistent with Grade I diastolic dysfunction (impaired relaxation). Right Ventricle: The right ventricular size is normal. No increase in right ventricular wall thickness. Right ventricular  systolic function is normal. Left Atrium: Left atrial size was moderately dilated. Right Atrium: Right atrial size was normal in size. Pericardium: There is no evidence of pericardial effusion. Mitral Valve: The mitral valve is normal in structure. Mild to moderate mitral valve regurgitation. No evidence of mitral valve stenosis. Tricuspid Valve: The tricuspid valve is normal in structure. Tricuspid valve regurgitation is mild . No evidence of tricuspid stenosis. Aortic Valve: The aortic valve is tricuspid. Aortic valve regurgitation is mild. Aortic regurgitation PHT measures 572 msec. No aortic stenosis is present. Pulmonic Valve: The pulmonic valve was normal in structure. Pulmonic valve regurgitation is trivial. No evidence of pulmonic stenosis. Aorta: The aortic root is normal in size and structure. Venous: The inferior vena cava was not well visualized. IAS/Shunts: No atrial level shunt detected by color flow Doppler.  LEFT VENTRICLE PLAX 2D LVIDd:         6.81 cm LVIDs:         5.78 cm LV PW:         1.16 cm LV IVS:        0.88 cm LVOT diam:     2.40 cm LV SV:         71 LV SV Index:   40 LVOT Area:     4.52 cm  RIGHT VENTRICLE RV S prime:     13.20 cm/s TAPSE (M-mode): 2.2 cm LEFT ATRIUM             Index       RIGHT ATRIUM           Index LA diam:        4.00 cm 2.24 cm/m  RA Area:     16.20 cm LA Vol (A2C):   61.5 ml 34.45 ml/m RA Volume:   37.90 ml  21.23 ml/m LA Vol (A4C):   50.9 ml 28.51 ml/m  LA Biplane Vol: 56.6 ml 31.70 ml/m  AORTIC VALVE LVOT Vmax:   96.90 cm/s LVOT Vmean:  62.900 cm/s LVOT VTI:    0.158 m AI PHT:      572 msec  AORTA Ao Root diam: 3.30 cm MITRAL VALVE                 TRICUSPID VALVE MV Area (PHT): 4.63 cm      TR Peak grad:   31.8 mmHg MV Decel Time: 164 msec      TR Vmax:        282.00 cm/s MR Peak grad:    91.4 mmHg MR Mean grad:    62.0 mmHg   SHUNTS MR Vmax:         478.00 cm/s Systemic VTI:  0.16 m MR Vmean:        370.0 cm/s  Systemic Diam: 2.40 cm MR PISA:         3.08  cm MR PISA Eff ROA: 21 mm MR PISA Radius:  0.70 cm MV E velocity: 105.00 cm/s MV A velocity: 104.00 cm/s MV E/A ratio:  1.01 Tiffany Somervell md Electronically signed by Chilton Si md Signature Date/Time: 05/13/2020/1:57:30 PM    Final    CT Angio Chest/Abd/Pel for Dissection W and/or W/WO  Result Date: 05/11/2020 CLINICAL DATA:  Abdominal pain with concern for aortic dissection or mesenteric ischemia. EXAM: CT ANGIOGRAPHY CHEST, ABDOMEN AND PELVIS TECHNIQUE: Noncontrast CT was first performed. Multidetector CT imaging through the chest, abdomen and pelvis was performed using the standard protocol during bolus administration of intravenous contrast. Multiplanar reconstructed images and MIPs were obtained and reviewed to evaluate the vascular anatomy. CONTRAST:  80mL OMNIPAQUE IOHEXOL 350 MG/ML SOLN COMPARISON:  Abdomen and pelvis CT from yesterday FINDINGS: CTA CHEST FINDINGS Cardiovascular: Preferential opacification of the thoracic aorta. No evidence of thoracic aortic aneurysm or dissection. Normal heart size. No pericardial effusion. Coronary atherosclerosis with probable prior stenting. There is subendocardial fatty deposition at the left ventricle from remote infarct. The left ventricle appears dilated. The pulmonary arteries are not opacified on this systemic arterially timed study. Atherosclerosis of the aorta and proximal great vessel Mediastinum/Nodes: No adenopathy or mass. Lungs/Pleura: Paraseptal emphysema. Peripheral ground-glass opacity in the left upper lobe measuring 1 cm on 7:67. Subpleural nodules in the lateral right middle lobe measuring up to 4 mm. Numerous additional small pulmonary nodules are noted in the bilateral lung and demarcated on axial lung windows and PACS created MIPS. There is no edema, consolidation, effusion, or pneumothorax. Musculoskeletal: No acute or aggressive finding Review of the MIP images confirms the above findings. CTA ABDOMEN AND PELVIS FINDINGS VASCULAR  Aorta: Diffuse atheromatous plaque.  No dissection or aneurysm. Celiac: Patent without evidence of aneurysm, dissection, vasculitis or significant stenosis. SMA: Diffusely attenuated peripheral branches associated with the loops of edematous bowel, extrinsic narrowing rather than primary vascular process Renals: Single left and 3 right renal arteries without branch occlusion, beading, or aneurysm. IMA: Patent Inflow: Atheromatous plaque. Veins: Negative Review of the MIP images confirms the above findings. NON-VASCULAR Hepatobiliary: No focal liver abnormality.No evidence of biliary obstruction or stone. Detection of calculi is limited due to biliary excretion of contrast from yesterday study. Pancreas: Unremarkable. Spleen: Unremarkable. Adrenals/Urinary Tract: Negative adrenals. No hydronephrosis or stone. Unremarkable bladder. Stomach/Bowel: There is clustered fluid-filled and edematous bowel in the central abdomen affecting multiple loops. The most severely thickened, along the right flank show no detectable mucosal or serosal enhancement. There is distortion of the  mesentery suggesting underlying adhesion or internal hernia. Lymphatic: No mass or adenopathy. Reproductive:No pathologic findings. Other: Small volume reactive ascites.  No pneumoperitoneum. Musculoskeletal: No acute abnormalities. Critical Value/emergent results were called by telephone at the time of interpretation on 05/11/2020 at 10:57 am to provider JULIE IDOL , who verbally acknowledged these results. Review of the MIP images confirms the above findings. IMPRESSION: 1. Twisted small-bowel with closed loop obstruction. Multiple loops are very edematous and poorly enhancing, ischemic appearing. No primary vascular cause is seen. 2. 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 Remote subendocardial left ventricular infarct. 3. Small pulmonary nodules, some not seen in 2018, including a ground-glass left upper lobe nodule measuring 1 cm. Recommend follow-up  noncontrast chest CT in 6-12 months. Electronically Signed   By: Marnee Spring M.D.   On: 05/11/2020 11:00    Catarina Hartshorn, DO  [facility]  If 7PM-7AM, please contact night-coverage [email or web address] Password [email or web address] 05/14/2020, 6:01 PM   LOS: 3 days

## 2020-05-15 LAB — SURGICAL PATHOLOGY

## 2020-05-15 NOTE — Progress Notes (Signed)
Patient assisted to the bathroom.  Patient had a bowel movement (soft mushy) and liquid bowel movement in the toilet.  Patient cleaned and assisted back to bed.

## 2020-05-15 NOTE — Progress Notes (Signed)
Dr. Thomes Dinning called back and patient demanded to talk to him. Through their conversation, Dr. Thomes Dinning realized patient is Brian Ford & O x 4. Patient was told to sign the AMA paper and he did. Brian Ford staff member excorted patient outside to wait for his ride. No acute distress noted.

## 2020-05-15 NOTE — Progress Notes (Addendum)
Patient has packed his belongings in a bag and trying to leave AMA and he's not listening to medical advice. Patient is drowsy from prior ativan and pain medications and I'm afraid he's going to fall.  He's A & O x 4. Paged Dr. Thomes Dinning and awaiting new directions.

## 2020-05-16 ENCOUNTER — Encounter (HOSPITAL_COMMUNITY): Payer: Self-pay | Admitting: General Surgery

## 2020-05-17 ENCOUNTER — Emergency Department (HOSPITAL_COMMUNITY)
Admission: EM | Admit: 2020-05-17 | Discharge: 2020-05-17 | Disposition: A | Discharge status: Home / Self Care | Payer: Self-pay | Attending: Emergency Medicine | Admitting: Emergency Medicine

## 2020-05-17 ENCOUNTER — Encounter (HOSPITAL_COMMUNITY): Payer: Self-pay

## 2020-05-17 ENCOUNTER — Other Ambulatory Visit: Payer: Self-pay

## 2020-05-17 DIAGNOSIS — Z4801 Encounter for change or removal of surgical wound dressing: Secondary | ICD-10-CM | POA: Insufficient documentation

## 2020-05-17 DIAGNOSIS — Z79899 Other long term (current) drug therapy: Secondary | ICD-10-CM | POA: Insufficient documentation

## 2020-05-17 DIAGNOSIS — Z7982 Long term (current) use of aspirin: Secondary | ICD-10-CM | POA: Insufficient documentation

## 2020-05-17 DIAGNOSIS — Z4800 Encounter for change or removal of nonsurgical wound dressing: Secondary | ICD-10-CM

## 2020-05-17 DIAGNOSIS — I2510 Atherosclerotic heart disease of native coronary artery without angina pectoris: Secondary | ICD-10-CM | POA: Insufficient documentation

## 2020-05-17 DIAGNOSIS — F17210 Nicotine dependence, cigarettes, uncomplicated: Secondary | ICD-10-CM | POA: Insufficient documentation

## 2020-05-17 DIAGNOSIS — I10 Essential (primary) hypertension: Secondary | ICD-10-CM | POA: Insufficient documentation

## 2020-05-17 MED ORDER — HYDROCODONE-ACETAMINOPHEN 5-325 MG PO TABS
1.0000 | ORAL_TABLET | Freq: Once | ORAL | Status: AC
  Administered 2020-05-17: 1 via ORAL
  Filled 2020-05-17: qty 1

## 2020-05-17 MED ORDER — ONDANSETRON 4 MG PO TBDP: 4.0000 mg | ORAL_TABLET | Freq: Three times a day (TID) | ORAL | 0 refills | Status: AC | PRN

## 2020-05-17 NOTE — ED Notes (Signed)
Dressing to abdomen changed using sterile technique. Supplies given to pt for several more dressing changes at home.

## 2020-05-17 NOTE — Discharge Instructions (Addendum)
Prescription sent to [facility] for zofran. This is for nausea. Take if needed.  Narcotic medications cannot be sent to [facility]. You will need to follow up with your primary care doctor or surgeon Dr. Lovell Sheehan for pain medicine.  A consult has been placed for social work to call you in the next few days to discuss the possibility of home health assistance which can help with bandage changes and wound care.  Watch for signs of infection like pus draining from wound or red streaking, fever, chills.

## 2020-05-17 NOTE — ED Provider Notes (Addendum)
[facility] EMERGENCY DEPARTMENT Provider Note   CSN: [id number] Arrival date & time: 05/17/20  1226     History Chief Complaint  Patient presents with  . Fatigue    Brian Ford is a 54 y.o. male with past medical history as listed below.  HPI Patient presents to emergency department today with chief complaint of fatigue.  When asked patient to elaborate he states he is here for his prescriptions.  Patient was recently admitted to the hospital 10/7-10/11 for small bowel obstruction with ex lap performed by Dr. Lovell Sheehan and he is ultimately left AMA.  He states he is not given any prescriptions at discharge.  He is requesting the antibiotic, nausea medicine and narcotic pain medicine.  He has been otherwise doing well at home.  His last bowel movement was this morning and he ate a sandwich prior to arrival. He denies any medications for his symptoms prior to arrival. He would not answer if he recently used any drugs. Denies fever, chills, nausea, emesis.      Past Medical History:  Diagnosis Date  . Anxiety   . CAD (coronary artery disease) 2012   a. PCI of 100% RCA (2012) b. s/p BMS to LAD c. 02/2018: Anterior STEMI with DES to LAD; CTO of RCA with collaterals present.   . Crack cocaine use   . Depression   . GERD (gastroesophageal reflux disease)   . Hyperlipidemia   . Hypertension   . Normocytic anemia   . ST elevation myocardial infarction (STEMI) of inferior wall [facility])     Patient Active Problem List   Diagnosis Date Noted  . Hypokalemia 05/13/2020  . Hypophosphatemia 05/13/2020  . S/P small bowel resection 05/11/2020  . SBO (small bowel obstruction) (HCC)   . Abnormal CT of the chest 07/22/2017  . Pulmonary nodules 07/22/2017  . Pulmonary emphysema (HCC) 07/22/2017  . Aortic atherosclerosis (HCC) 07/22/2017  . Coronary artery calcification 07/22/2017  . Cigarette nicotine dependence with nicotine-induced disorder 07/22/2017  . Presence of Bare Metal stent in  LAD coronary artery 05/20/2014  . Hyperlipidemia   . Normocytic anemia   . CAD (coronary artery disease)   . Acute coronary syndrome (HCC) 05/19/2014  . MI, old 05/19/2014  . Cocaine abuse (HCC) 05/19/2014  . Tobacco abuse 05/19/2014  . ST elevation myocardial infarction involving left anterior descending (LAD) coronary artery (HCC) 05/19/2014  . CAD S/P percutaneous coronary angioplasty 12/25/2010    Past Surgical History:  Procedure Laterality Date  . BOWEL RESECTION  05/11/2020   Procedure: PARTIAL SMALL BOWEL RESECTION;  Surgeon: Franky Macho, MD;  Location: [facility] ORS;  Service: General;;  . CORONARY ANGIOPLASTY    . CORONARY/GRAFT ACUTE MI REVASCULARIZATION N/A 03/03/2018   Procedure: Coronary/Graft Acute MI Revascularization;  Surgeon: Tonny Bollman, MD;  Location: [facility] INVASIVE CV LAB;  Service: Cardiovascular;  Laterality: N/A;  . LAPAROTOMY N/A 05/11/2020   Procedure: EXPLORATORY LAPAROTOMY;  Surgeon: Franky Macho, MD;  Location: [facility]S;  Service: General;  Laterality: N/A;  . LEFT HEART CATH AND CORONARY ANGIOGRAPHY N/A 03/03/2018   Procedure: LEFT HEART CATH AND CORONARY ANGIOGRAPHY;  Surgeon: Tonny Bollman, MD;  Location: [facility] INVASIVE CV LAB;  Service: Cardiovascular;  Laterality: N/A;  . LEFT HEART CATHETERIZATION WITH CORONARY ANGIOGRAM N/A 05/19/2014   Procedure: LEFT HEART CATHETERIZATION WITH CORONARY ANGIOGRAM;  Surgeon: Marykay Lex, MD;  Location: [facility] CATH LAB;  Service: Cardiovascular;  Laterality: N/A;  . NOSE SURGERY    . PERCUTANEOUS CORONARY STENT INTERVENTION (PCI-S)  05/19/2014   Procedure: PERCUTANEOUS CORONARY STENT INTERVENTION (PCI-S);  Surgeon: Marykay Lex, MD;  Location: [facility] CATH LAB;  Service: Cardiovascular;;  BMS to Mid LAD       Family History  Problem Relation Age of Onset  . Hypertension Mother   . Heart disease Mother   . Stroke Mother   . Diabetes Sister   . Diabetes Brother     Social History   Tobacco Use  . Smoking status:  Current Every Day Smoker    Packs/day: 1.00    Years: 35.00    Pack years: 35.00    Types: Cigarettes  . Smokeless tobacco: Never Used  Vaping Use  . Vaping Use: Never used  Substance Use Topics  . Alcohol use: No  . Drug use: Yes    Types: "Crack" cocaine, Oxycodone, Benzodiazepines, Cocaine    Home Medications Prior to Admission medications   Medication Sig Start Date End Date Taking? Authorizing Provider  acetaminophen (TYLENOL) 500 MG tablet Take 500 mg by mouth every 6 (six) hours as needed for moderate pain.   Yes [provider]  aspirin EC 81 MG tablet Take 1 tablet (81 mg total) by mouth daily. 03/04/18  Yes Azalee Course, PA  carvedilol (COREG) 3.125 MG tablet Take 1 tablet (3.125 mg total) by mouth 2 (two) times daily. 08/27/19 08/26/20 Yes Jonelle Sidle, MD  HYDROcodone-acetaminophen (NORCO/VICODIN) 5-325 MG tablet Take 1 tablet by mouth every 6 (six) hours as needed for moderate pain. 05/11/20  Yes Vanetta Mulders, MD  nitroGLYCERIN (NITROSTAT) 0.4 MG SL tablet Place 1 tablet (0.4 mg total) under the tongue every 5 (five) minutes x 3 doses as needed for chest pain. 03/12/18  Yes Strader, Lennart Pall, PA-C  ticagrelor (BRILINTA) 90 MG TABS tablet Take 1 tablet (90 mg total) by mouth 2 (two) times daily. 03/12/18  Yes Strader, [place] M, PA-C  atorvastatin (LIPITOR) 80 MG tablet Take 1 tablet (80 mg total) by mouth daily at 6 PM. Patient not taking: Reported on 05/10/2020 08/27/19   Jonelle Sidle, MD  ondansetron (ZOFRAN ODT) 4 MG disintegrating tablet Take 1 tablet (4 mg total) by mouth every 8 (eight) hours as needed for nausea or vomiting. 05/17/20   Rider Ermis, Caroleen Hamman, PA-C    Allergies    Patient has no known allergies.  Review of Systems   Review of Systems All other systems are reviewed and are negative for acute change except as noted in the HPI.  Physical Exam Updated Vital Signs BP (!) 141/107 (BP Location: Right Arm)   Pulse 83   Temp 98.2 F (36.8  C) (Oral)   Resp 18   Ht 5\' 8"  (1.727 m)   Wt 59 kg   SpO2 100%   BMI 19.77 kg/m   Physical Exam Vitals and nursing note reviewed.  Constitutional:      General: He is not in acute distress.    Appearance: He is not ill-appearing.     Comments: Patient eating a sandwich, 2 bags of chips and drinking to ginger ales upon my entering the room for examination  HENT:     Head: Normocephalic and atraumatic.     Right Ear: Tympanic membrane and external ear normal.     Left Ear: Tympanic membrane and external ear normal.     Nose: Nose normal.     Mouth/Throat:     Mouth: Mucous membranes are moist.     Pharynx: Oropharynx is clear.  Eyes:  General: No scleral icterus.       Right eye: No discharge.        Left eye: No discharge.     Extraocular Movements: Extraocular movements intact.     Conjunctiva/sclera: Conjunctivae normal.     Pupils: Pupils are equal, round, and reactive to light.  Neck:     Vascular: No JVD.  Cardiovascular:     Rate and Rhythm: Normal rate and regular rhythm.     Pulses: Normal pulses.          Radial pulses are 2+ on the right side and 2+ on the left side.     Heart sounds: Normal heart sounds.  Pulmonary:     Comments: Lungs clear to auscultation in all fields. Symmetric chest rise. No wheezing, rales, or rhonchi. Abdominal:     Comments: Abdomen is soft, non-distended, and non-tender in all quadrants.  He has clear bandage over surgical incision.  It is clean dry and intact.  No signs of wound dehiscence.  No surrounding erythema or purulent drainage.  Musculoskeletal:        General: Normal range of motion.     Cervical back: Normal range of motion.  Skin:    General: Skin is warm and dry.     Capillary Refill: Capillary refill takes less than 2 seconds.  Neurological:     Mental Status: He is oriented to person, place, and time.     GCS: GCS eye subscore is 4. GCS verbal subscore is 5. GCS motor subscore is 6.     Comments: Fluent speech,  no facial droop.  Psychiatric:        Behavior: Behavior normal.     ED Results / Procedures / Treatments   Labs (all labs ordered are listed, but only abnormal results are displayed) Labs Reviewed - No data to display  EKG None  Radiology No results found.  Procedures Procedures (including critical care time)  Medications Ordered in ED Medications  HYDROcodone-acetaminophen (NORCO/VICODIN) 5-325 MG per tablet 1 tablet (1 tablet Oral Given 05/17/20 1651)    ED Course  I have reviewed the triage vital signs and the nursing notes.  Pertinent labs & imaging results that were available during my care of the patient were reviewed by me and considered in my medical decision making (see chart for details).    MDM Rules/Calculators/A&P                          History provided by patient with additional history obtained from chart review.    Patient seen and examined. Patient presents awake, alert, hemodynamically stable, afebrile, non toxic.  Upon my entering the room he is eating a sandwich, chips and drinking soda.  He burps after finishing his meal.  He has no abdominal tenderness.  Surgical incision on abdomen is well-appearing.  No signs of infection or wound dehiscence.  Will perform dressing change. No indications for antibiotics. I read through previous notes during last admission and there are no indications that patient was to be discharged with antibiotics.  Patient given dose of pain medicine here.  Do not feel comfortable prescribing narcotics given his history of cocaine abuse.  Prescription for Zofran sent to pharmacy for nausea.  Transitions of care consult placed to help with home health dressing changes.  Patient advised to follow-up with PCP or general surgeon.  Discussed with on-call social work who asked that I change the face-to-face orders.  They asked that I include RN and social work on the order.  Patient's updated phone number is [phone].  He was  discharged home with wound care supplies  The patient appears reasonably screened and/or stabilized for discharge and I doubt any other medical condition or other [facility] requiring further screening, evaluation, or treatment in the ED at this time prior to discharge. The patient is safe for discharge with strict return precautions discussed.    Portions of this note were generated with Scientist, clinical (histocompatibility and immunogenetics). Dictation errors may occur despite best attempts at proofreading.   Final Clinical Impression(s) / ED Diagnoses Final diagnoses:  Encounter for postprocedural change of dressing    Rx / DC Orders ED Discharge Orders         Ordered    ondansetron (ZOFRAN ODT) 4 MG disintegrating tablet  Every 8 hours PRN        05/17/20 1526    Home Health  Status:  Canceled        05/17/20 1531    Face-to-face encounter (required for Medicare/Medicaid patients)  Status:  Canceled       Comments: I Caroleen Hamman  Sharese Manrique certify that this patient is under my care and that I, or a nurse practitioner or physician's assistant working with me, had a face-to-face encounter that meets the physician face-to-face encounter requirements with this patient on 05/17/2020. The encounter with the patient was in whole, or in part for the following medical condition(s) which is the primary reason for home health care (List medical condition): recent SBO, unable to do dressing changes or wound care at home secondary to pain   05/17/20 1531    Home Health  Status:  Canceled        05/17/20 1639    Face-to-face encounter (required for Medicare/Medicaid patients)  Status:  Canceled       Comments: I Caroleen Hamman  Liberty Seto certify that this patient is under my care and that I, or a nurse practitioner or physician's assistant working with me, had a face-to-face encounter that meets the physician face-to-face encounter requirements with this patient on 05/17/2020. The encounter with the patient was in whole, or in part for the  following medical condition(s) which is the primary reason for home health care (List medical condition): recent SBO, needs home health RN for assistance with wound care   05/17/20 1639    Home Health        05/17/20 1658    Face-to-face encounter (required for Medicare/Medicaid patients)       Comments: I Caroleen Hamman  Maloni Musleh certify that this patient is under my care and that I, or a nurse practitioner or physician's assistant working with me, had a face-to-face encounter that meets the physician face-to-face encounter requirements with this patient on 05/17/2020. The encounter with the patient was in whole, or in part for the following medical condition(s) which is the primary reason for home health care (List medical condition): recent SBO. Wound care   05/17/20 1658           Kathyrn Lass 05/17/20 1623    Orvile Corona, [place] 05/17/20 1659    Terrilee Files, MD 05/18/20 1053

## 2020-05-17 NOTE — ED Triage Notes (Signed)
Pt to er, pt states that he was here last week, states that he was supposed to get some prescriptions; however, he left ama, states that he is here for nausea med and abx.  States that he is here to get his proscriptions.

## 2020-05-17 NOTE — Clinical Social Work Note (Signed)
Transition of Care [facility]) - Emergency Department Mini Assessment  Patient Details  Name: Brian Ford MRN: [id number] Date of Birth: Oct 09, 1965  Transition of Care [facility]) CM/SW Contact:    Ewing Schlein, LCSW Phone Number: 05/17/2020, 4:51 PM  Clinical Narrative: Patient is a 54 year old male who presented to the ED for weakness. TOC received consult for [facility] needs. CSW spoke with Bonita Quin with [facility], who is providing charity HH this week. [facility] accepted referral. CSW updated PA that patient will need HH orders for RN/SW, wound care supplies, and EDP note signed by a physician. TOC signing off.  ED Mini Assessment: What brought you to the Emergency Department? : Weakness Barriers to Discharge: ED Barriers Resolved Barrier interventions: Referred patient to charity [facility] Means of departure: Car Interventions which prevented an admission or readmission: Home Health Consult or Services  Patient Contact and Communications Key Contact 1: [facility] Spoke with: Yolanda Manges Date: 05/17/20 Contact time: 1638 Call outcome: Referral made to [facility]  Admission diagnosis:  weakness Patient Active Problem List   Diagnosis Date Noted  . Hypokalemia 05/13/2020  . Hypophosphatemia 05/13/2020  . S/P small bowel resection 05/11/2020  . SBO (small bowel obstruction) (HCC)   . Abnormal CT of the chest 07/22/2017  . Pulmonary nodules 07/22/2017  . Pulmonary emphysema (HCC) 07/22/2017  . Aortic atherosclerosis (HCC) 07/22/2017  . Coronary artery calcification 07/22/2017  . Cigarette nicotine dependence with nicotine-induced disorder 07/22/2017  . Presence of Bare Metal stent in LAD coronary artery 05/20/2014  . Hyperlipidemia   . Normocytic anemia   . CAD (coronary artery disease)   . Acute coronary syndrome (HCC) 05/19/2014  . MI, old 05/19/2014  . Cocaine abuse (HCC) 05/19/2014  . Tobacco abuse 05/19/2014  . ST elevation myocardial infarction involving left anterior descending (LAD) coronary artery (HCC)  05/19/2014  . CAD S/P percutaneous coronary angioplasty 12/25/2010   PCP:  Patient, No Pcp Per Pharmacy:   [id number] - [place], [place] - [address] [place] [address] Phone: [phone] Fax: [phone]  [facility] [address], [place] [address] [place] [address] Phone: [phone] Fax: [phone]  [facility] Dept. [place], [place] - [address]  [place] [address] Phone: [phone] Fax: [phone]  [facility] - [place], [place] - [place] [address] [place] [address] Phone: [phone] Fax: [phone]

## 2020-05-17 NOTE — ED Notes (Signed)
Pt has been wheeling around in W/C  Swinging around cane. Has requested x 2 to be pushed out to his  Car to leave. Making comments that we are taking well dressed people back before him since he doesn't have insurance. Assured him were not doing that, we take people back by acuity

## 2020-05-20 ENCOUNTER — Other Ambulatory Visit: Payer: Self-pay

## 2020-05-20 ENCOUNTER — Encounter (HOSPITAL_COMMUNITY): Payer: Self-pay

## 2020-05-20 ENCOUNTER — Emergency Department (HOSPITAL_COMMUNITY): Payer: Self-pay

## 2020-05-20 ENCOUNTER — Emergency Department (HOSPITAL_COMMUNITY)
Admission: EM | Admit: 2020-05-20 | Discharge: 2020-05-20 | Disposition: A | Discharge status: Home / Self Care | Payer: Self-pay | Attending: Emergency Medicine | Admitting: Emergency Medicine

## 2020-05-20 DIAGNOSIS — K219 Gastro-esophageal reflux disease without esophagitis: Secondary | ICD-10-CM | POA: Insufficient documentation

## 2020-05-20 DIAGNOSIS — I2510 Atherosclerotic heart disease of native coronary artery without angina pectoris: Secondary | ICD-10-CM | POA: Insufficient documentation

## 2020-05-20 DIAGNOSIS — Z79899 Other long term (current) drug therapy: Secondary | ICD-10-CM | POA: Insufficient documentation

## 2020-05-20 DIAGNOSIS — Z955 Presence of coronary angioplasty implant and graft: Secondary | ICD-10-CM | POA: Insufficient documentation

## 2020-05-20 DIAGNOSIS — I10 Essential (primary) hypertension: Secondary | ICD-10-CM | POA: Insufficient documentation

## 2020-05-20 DIAGNOSIS — K5900 Constipation, unspecified: Secondary | ICD-10-CM | POA: Insufficient documentation

## 2020-05-20 DIAGNOSIS — F17210 Nicotine dependence, cigarettes, uncomplicated: Secondary | ICD-10-CM | POA: Insufficient documentation

## 2020-05-20 DIAGNOSIS — Z7982 Long term (current) use of aspirin: Secondary | ICD-10-CM | POA: Insufficient documentation

## 2020-05-20 LAB — CBC WITH DIFFERENTIAL/PLATELET
Abs Immature Granulocytes: 0.05 10*3/uL (ref 0.00–0.07)
Basophils Absolute: 0 10*3/uL (ref 0.0–0.1)
Basophils Relative: 0 %
Eosinophils Absolute: 0.3 10*3/uL (ref 0.0–0.5)
Eosinophils Relative: 3 %
HCT: 36.8 % — ABNORMAL LOW (ref 39.0–52.0)
Hemoglobin: 12.2 g/dL — ABNORMAL LOW (ref 13.0–17.0)
Immature Granulocytes: 1 %
Lymphocytes Relative: 21 %
Lymphs Abs: 2.1 10*3/uL (ref 0.7–4.0)
MCH: 28 pg (ref 26.0–34.0)
MCHC: 33.2 g/dL (ref 30.0–36.0)
MCV: 84.4 fL (ref 80.0–100.0)
Monocytes Absolute: 0.4 10*3/uL (ref 0.1–1.0)
Monocytes Relative: 4 %
Neutro Abs: 7 10*3/uL (ref 1.7–7.7)
Neutrophils Relative %: 71 %
Platelets: 326 10*3/uL (ref 150–400)
RBC: 4.36 MIL/uL (ref 4.22–5.81)
RDW: 13.5 % (ref 11.5–15.5)
WBC: 9.9 10*3/uL (ref 4.0–10.5)
nRBC: 0 % (ref 0.0–0.2)

## 2020-05-20 LAB — COMPREHENSIVE METABOLIC PANEL
ALT: 17 U/L (ref 0–44)
AST: 17 U/L (ref 15–41)
Albumin: 2.9 g/dL — ABNORMAL LOW (ref 3.5–5.0)
Alkaline Phosphatase: 50 U/L (ref 38–126)
Anion gap: 7 (ref 5–15)
BUN: 16 mg/dL (ref 6–20)
CO2: 26 mmol/L (ref 22–32)
Calcium: 8.2 mg/dL — ABNORMAL LOW (ref 8.9–10.3)
Chloride: 103 mmol/L (ref 98–111)
Creatinine, Ser: 0.97 mg/dL (ref 0.61–1.24)
GFR, Estimated: >60 mL/min (ref >60)
Glucose, Bld: 87 mg/dL (ref 70–99)
Potassium: 3.4 mmol/L — ABNORMAL LOW (ref 3.5–5.1)
Sodium: 136 mmol/L (ref 135–145)
Total Bilirubin: 0.6 mg/dL (ref 0.3–1.2)
Total Protein: 6.1 g/dL — ABNORMAL LOW (ref 6.5–8.1)

## 2020-05-20 LAB — LIPASE, BLOOD: Lipase: 69 U/L — ABNORMAL HIGH (ref 11–51)

## 2020-05-20 MED ORDER — IOHEXOL 300 MG/ML  SOLN
100.0000 mL | Freq: Once | INTRAMUSCULAR | Status: AC | PRN
  Administered 2020-05-20: 100 mL via INTRAVENOUS

## 2020-05-20 MED ORDER — HYDROCODONE-ACETAMINOPHEN 5-325 MG PO TABS: 1.0000 | ORAL_TABLET | Freq: Four times a day (QID) | ORAL | 0 refills | Status: DC | PRN

## 2020-05-20 MED ORDER — POLYETHYLENE GLYCOL 3350 17 G PO PACK: 17.0000 g | PACK | Freq: Every day | ORAL | 0 refills | Status: AC

## 2020-05-20 MED ORDER — ONDANSETRON HCL 4 MG/2ML IJ SOLN
4.0000 mg | Freq: Once | INTRAMUSCULAR | Status: AC
  Administered 2020-05-20: 4 mg via INTRAVENOUS
  Filled 2020-05-20: qty 2

## 2020-05-20 MED ORDER — MORPHINE SULFATE (PF) 4 MG/ML IV SOLN
4.0000 mg | Freq: Once | INTRAVENOUS | Status: AC
  Administered 2020-05-20: 4 mg via INTRAVENOUS
  Filled 2020-05-20: qty 1

## 2020-05-20 NOTE — Discharge Instructions (Signed)
Keep your appointment wit Dr. Lovell Sheehan on Tuesday as discussed.  Start using your MiraLAX to help with your constipation.  You have been given a few pain tablets, as you are aware this medication can increase constipation so use this very sparingly.  Also do not drive within 4 hours of taking this medication as it will make you drowsy.  If you develop any fever or worsening abdominal pain return here immediately for a recheck of your symptoms, otherwise plan to see Dr. Lovell Sheehan on Tuesday.

## 2020-05-20 NOTE — ED Triage Notes (Signed)
Pt to er, pt states that he is here for abd pain, states that he is here because he feels like he needs to have a bowel movement.

## 2020-05-21 ENCOUNTER — Telehealth (HOSPITAL_COMMUNITY): Payer: Self-pay | Admitting: Emergency Medicine

## 2020-05-21 MED ORDER — HYDROCODONE-ACETAMINOPHEN 5-325 MG PO TABS: 1.0000 | ORAL_TABLET | ORAL | 0 refills | Status: AC | PRN

## 2020-05-21 NOTE — Telephone Encounter (Signed)
Pt was to receive a prepack last night, instead was given the printed prescription which was not honored by pharmacy.  Escribed 6 tabs to his pharmacy.

## 2020-05-21 NOTE — ED Provider Notes (Signed)
[facility] EMERGENCY DEPARTMENT Provider Note   CSN: [id number] Arrival date & time: 05/20/20  1512     History Chief Complaint  Patient presents with  . Abdominal Pain    Brian Ford is a 54 y.o. male with a history as outlined below and who was recently admitted to the hospital for a small bowel closed-loop obstruction and concern for ischemia, having undergone a partial small bowel resection under the care of Dr. Lovell Sheehan.  Patient left the hospital AMA on 05/15/2020.  He returns today secondary to continued complaint of abdominal pain but also feels like he is constipated stating it has been days since he has had a bowel movement.  He denies rectal pain or pressure.  He is also had no fevers or chills, denies nausea or vomiting.  He has had no problems with his abdominal incision site, no drainage or erythema.  He is unsure of when his follow-up with Dr. Lovell Sheehan should be, stating he received no instructions when he left the hospital.  I reminded him that he left AGAINST MEDICAL ADVICE and plan for discharge had not been formulated at the time of his departure.  He has had no medications or is found any alleviators for his constipation or his abdominal pain. HPI     Past Medical History:  Diagnosis Date  . Anxiety   . CAD (coronary artery disease) 2012   a. PCI of 100% RCA (2012) b. s/p BMS to LAD c. 02/2018: Anterior STEMI with DES to LAD; CTO of RCA with collaterals present.   . Crack cocaine use   . Depression   . GERD (gastroesophageal reflux disease)   . Hyperlipidemia   . Hypertension   . Normocytic anemia   . ST elevation myocardial infarction (STEMI) of inferior wall [facility])     Patient Active Problem List   Diagnosis Date Noted  . Hypokalemia 05/13/2020  . Hypophosphatemia 05/13/2020  . S/P small bowel resection 05/11/2020  . SBO (small bowel obstruction) (HCC)   . Abnormal CT of the chest 07/22/2017  . Pulmonary nodules 07/22/2017  . Pulmonary emphysema (HCC)  07/22/2017  . Aortic atherosclerosis (HCC) 07/22/2017  . Coronary artery calcification 07/22/2017  . Cigarette nicotine dependence with nicotine-induced disorder 07/22/2017  . Presence of Bare Metal stent in LAD coronary artery 05/20/2014  . Hyperlipidemia   . Normocytic anemia   . CAD (coronary artery disease)   . Acute coronary syndrome (HCC) 05/19/2014  . MI, old 05/19/2014  . Cocaine abuse (HCC) 05/19/2014  . Tobacco abuse 05/19/2014  . ST elevation myocardial infarction involving left anterior descending (LAD) coronary artery (HCC) 05/19/2014  . CAD S/P percutaneous coronary angioplasty 12/25/2010    Past Surgical History:  Procedure Laterality Date  . BOWEL RESECTION  05/11/2020   Procedure: PARTIAL SMALL BOWEL RESECTION;  Surgeon: Franky Macho, MD;  Location: [facility];  Service: General;;  . CORONARY ANGIOPLASTY    . CORONARY/GRAFT ACUTE MI REVASCULARIZATION N/A 03/03/2018   Procedure: Coronary/Graft Acute MI Revascularization;  Surgeon: Tonny Bollman, MD;  Location: [facility] INVASIVE CV LAB;  Service: Cardiovascular;  Laterality: N/A;  . LAPAROTOMY N/A 05/11/2020   Procedure: EXPLORATORY LAPAROTOMY;  Surgeon: Franky Macho, MD;  Location: [facility]S;  Service: General;  Laterality: N/A;  . LEFT HEART CATH AND CORONARY ANGIOGRAPHY N/A 03/03/2018   Procedure: LEFT HEART CATH AND CORONARY ANGIOGRAPHY;  Surgeon: Tonny Bollman, MD;  Location: [facility] INVASIVE CV LAB;  Service: Cardiovascular;  Laterality: N/A;  . LEFT HEART CATHETERIZATION WITH  CORONARY ANGIOGRAM N/A 05/19/2014   Procedure: LEFT HEART CATHETERIZATION WITH CORONARY ANGIOGRAM;  Surgeon: Marykay Lex, MD;  Location: [facility] CATH LAB;  Service: Cardiovascular;  Laterality: N/A;  . NOSE SURGERY    . PERCUTANEOUS CORONARY STENT INTERVENTION (PCI-S)  05/19/2014   Procedure: PERCUTANEOUS CORONARY STENT INTERVENTION (PCI-S);  Surgeon: Marykay Lex, MD;  Location: [facility] CATH LAB;  Service: Cardiovascular;;  BMS to Mid LAD       Family  History  Problem Relation Age of Onset  . Hypertension Mother   . Heart disease Mother   . Stroke Mother   . Diabetes Sister   . Diabetes Brother     Social History   Tobacco Use  . Smoking status: Current Every Day Smoker    Packs/day: 1.00    Years: 35.00    Pack years: 35.00    Types: Cigarettes  . Smokeless tobacco: Never Used  Vaping Use  . Vaping Use: Never used  Substance Use Topics  . Alcohol use: No  . Drug use: Not Currently    Types: "Crack" cocaine, Oxycodone, Benzodiazepines, Cocaine    Home Medications Prior to Admission medications   Medication Sig Start Date End Date Taking? Authorizing Provider  aspirin EC 81 MG tablet Take 1 tablet (81 mg total) by mouth daily. 03/04/18  Yes Azalee Course, PA  atorvastatin (LIPITOR) 80 MG tablet Take 1 tablet (80 mg total) by mouth daily at 6 PM. 08/27/19  Yes Jonelle Sidle, MD  carvedilol (COREG) 3.125 MG tablet Take 1 tablet (3.125 mg total) by mouth 2 (two) times daily. 08/27/19 08/26/20 Yes Jonelle Sidle, MD  ondansetron (ZOFRAN ODT) 4 MG disintegrating tablet Take 1 tablet (4 mg total) by mouth every 8 (eight) hours as needed for nausea or vomiting. 05/17/20  Yes Albrizze, Caroleen Hamman, PA-C  ticagrelor (BRILINTA) 90 MG TABS tablet Take 1 tablet (90 mg total) by mouth 2 (two) times daily. 03/12/18  Yes Strader, [place] M, PA-C  acetaminophen (TYLENOL) 500 MG tablet Take 500 mg by mouth every 6 (six) hours as needed for moderate pain.    [provider]  HYDROcodone-acetaminophen (NORCO/VICODIN) 5-325 MG tablet Take 1 tablet by mouth every 4 (four) hours as needed for moderate pain. 05/21/20   Katilynn Sinkler, Raynelle Fanning, PA-C  nitroGLYCERIN (NITROSTAT) 0.4 MG SL tablet Place 1 tablet (0.4 mg total) under the tongue every 5 (five) minutes x 3 doses as needed for chest pain. 03/12/18   Strader, Lennart Pall, PA-C  polyethylene glycol (MIRALAX / GLYCOLAX) 17 g packet Take 17 g by mouth daily. 05/20/20   Burgess Amor, PA-C    Allergies      Patient has no known allergies.  Review of Systems   Review of Systems  Constitutional: Negative for chills and fever.  HENT: Negative for congestion and sore throat.   Eyes: Negative.   Respiratory: Negative for chest tightness and shortness of breath.   Cardiovascular: Negative for chest pain.  Gastrointestinal: Positive for abdominal pain and constipation. Negative for abdominal distention, nausea and vomiting.  Genitourinary: Negative.   Musculoskeletal: Negative for arthralgias, joint swelling and neck pain.  Skin: Negative.  Negative for rash and wound.  Neurological: Negative for dizziness, weakness, light-headedness, numbness and headaches.  Psychiatric/Behavioral: Negative.   All other systems reviewed and are negative.   Physical Exam Updated Vital Signs BP 128/84 (BP Location: Right Arm)   Pulse 76   Temp 98.5 F (36.9 C) (Oral)   Resp 18   Ht  5\' 8"  (1.727 m)   Wt 59 kg   SpO2 100%   BMI 19.77 kg/m   Physical Exam Vitals and nursing note reviewed.  Constitutional:      Appearance: He is well-developed.  HENT:     Head: Normocephalic and atraumatic.  Eyes:     Conjunctiva/sclera: Conjunctivae normal.  Cardiovascular:     Rate and Rhythm: Normal rate and regular rhythm.     Heart sounds: Normal heart sounds.  Pulmonary:     Effort: Pulmonary effort is normal.     Breath sounds: Normal breath sounds. No wheezing.  Abdominal:     General: Bowel sounds are normal.     Palpations: Abdomen is soft.     Tenderness: There is generalized abdominal tenderness. There is no guarding or rebound.     Comments: Well-healing midline vertical surgical incision.  There is no significant surrounding erythema or induration.  There is no drainage from the incision site.  Musculoskeletal:        General: Normal range of motion.     Cervical back: Normal range of motion.  Skin:    General: Skin is warm and dry.  Neurological:     Mental Status: He is alert.     ED  Results / Procedures / Treatments   Labs (all labs ordered are listed, but only abnormal results are displayed) Labs Reviewed  CBC WITH DIFFERENTIAL/PLATELET - Abnormal; Notable for the following components:      Result Value   Hemoglobin 12.2 (*)    HCT 36.8 (*)    All other components within normal limits  COMPREHENSIVE METABOLIC PANEL - Abnormal; Notable for the following components:   Potassium 3.4 (*)    Calcium 8.2 (*)    Total Protein 6.1 (*)    Albumin 2.9 (*)    All other components within normal limits  LIPASE, BLOOD - Abnormal; Notable for the following components:   Lipase 69 (*)    All other components within normal limits    EKG None  Radiology CT ABDOMEN PELVIS W CONTRAST  Result Date: 05/20/2020 CLINICAL DATA:  54 year old male with abdominal pain. Recent partial small bowel resection. EXAM: CT ABDOMEN AND PELVIS WITH CONTRAST TECHNIQUE: Multidetector CT imaging of the abdomen and pelvis was performed using the standard protocol following bolus administration of intravenous contrast. CONTRAST:  OMNIPAQUE IOHEXOL 300 MG/ML  SOLN COMPARISON:  CT abdomen pelvis dated 05/11/2020. FINDINGS: Lower chest: Bibasilar linear atelectasis. The visualized lung bases are otherwise clear. There is coronary vascular calcification. There is moderate pneumoperitoneum, likely postsurgical. Small free fluid within the pelvis and small perihepatic free fluid. Hepatobiliary: The liver is unremarkable. No intrahepatic biliary ductal dilatation. The gallbladder is unremarkable. Pancreas: Unremarkable. No pancreatic ductal dilatation or surrounding inflammatory changes. Spleen: Normal in size without focal abnormality. Adrenals/Urinary Tract: The adrenal glands unremarkable. There is no hydronephrosis on either side. There is symmetric enhancement and excretion of contrast by both kidneys. The visualized ureters and urinary bladder appear unremarkable. Stomach/Bowel: Postsurgical changes of  partial small-bowel resection with anastomotic suture in the mid abdomen. There is moderate amount of stool throughout the colon. No bowel obstruction. The appendix is normal. Vascular/Lymphatic: Mild aortoiliac atherosclerotic disease. There is a 2.1 cm infrarenal aortic ectasia. The IVC is unremarkable. No portal venous gas. There is no adenopathy. Reproductive: The prostate and seminal vesicles are grossly unremarkable. Other: There is a loculated fluid within the pelvis measuring approximately 6 x 1 cm on coronal series 5, image  60 and 3 x 5 cm on axial 59/2 concerning for an infected fluid or developing abscess. There is a midline vertical anterior abdominal wall surgical incision with fluid and air extending from the anterior peritoneum through the abdominal musculature along the midline to the skin. Musculoskeletal: No acute or significant osseous findings. IMPRESSION: 1. Postsurgical changes of partial small-bowel resection. No bowel obstruction. Normal appendix. 2. A loculated fluid collection within the pelvis concerning for developing abscess. 3. Fluid and air extending from the anterior peritoneum through the abdominal musculature to the subcutaneous soft tissues along the midline vertical surgical incision. 4. Moderate pneumoperitoneum, postsurgical. 5. Aortic Atherosclerosis (ICD10-I70.0). Electronically Signed   By: Elgie Collard M.D.   On: 05/20/2020 21:28    Procedures Procedures (including critical care time)  Medications Ordered in ED Medications  morphine 4 MG/ML injection 4 mg (4 mg Intravenous Given 05/20/20 1954)  ondansetron (ZOFRAN) injection 4 mg (4 mg Intravenous Given 05/20/20 1954)  iohexol (OMNIPAQUE) 300 MG/ML solution 100 mL (100 mLs Intravenous Contrast Given 05/20/20 2107)    ED Course  I have reviewed the triage vital signs and the nursing notes.  Pertinent labs & imaging results that were available during my care of the patient were reviewed by me and considered  in my medical decision making (see chart for details).    MDM Rules/Calculators/A&P                          Discussed CT findings with Dr. Henreitta Leber. Patient has been afebrile and he has a normal white blood cell count. CT findings favoring seroma or postsurgical hematoma and not abscess formation. He does have follow-up care with Dr. Lovell Sheehan in his office in 3 days which she confirmed.. She suggested he plan to keep this appointment, no further interventions needed at this time, although she did request removal of his surgical wound dressing.  This was completed, surgical incision site appears healthy. strict return precautions for any fever or worsening pain which was discussed with patient who understands the plan.\  He was prescribed MiraLAX for constipation.  He was also given a prepack of hydrocodone for his abdominal pain.  He was advised caution with this medication and use sparingly as this medication can increase constipation.  Pt understands.  Final Clinical Impression(s) / ED Diagnoses Final diagnoses:  Constipation, unspecified constipation type    Rx / DC Orders ED Discharge Orders         Ordered    polyethylene glycol (MIRALAX / GLYCOLAX) 17 g packet  Daily        05/20/20 2215    HYDROcodone-acetaminophen (NORCO/VICODIN) 5-325 MG tablet  Every 6 hours PRN,   Status:  Discontinued        05/20/20 2215           Burgess Amor, PA-C 05/22/20 1326    Terald Sleeper, MD 05/23/20 [phone]

## 2020-05-23 ENCOUNTER — Ambulatory Visit (INDEPENDENT_AMBULATORY_CARE_PROVIDER_SITE_OTHER): Payer: Self-pay | Admitting: General Surgery

## 2020-05-23 ENCOUNTER — Encounter: Payer: Self-pay | Admitting: General Surgery

## 2020-05-23 ENCOUNTER — Other Ambulatory Visit: Payer: Self-pay

## 2020-05-23 ENCOUNTER — Other Ambulatory Visit: Payer: Self-pay | Admitting: Family Medicine

## 2020-05-23 VITALS — BP 147/86 | HR 71 | Temp 98.6°F | Resp 106 | Ht 68.0 in | Wt 133.0 lb

## 2020-05-23 DIAGNOSIS — K56609 Unspecified intestinal obstruction, unspecified as to partial versus complete obstruction: Secondary | ICD-10-CM

## 2020-05-23 DIAGNOSIS — Z09 Encounter for follow-up examination after completed treatment for conditions other than malignant neoplasm: Secondary | ICD-10-CM

## 2020-05-23 NOTE — Progress Notes (Signed)
Subjective:     Brian Ford  Patient here for postoperative visit.  His postoperative course has been complicated by his history of schizophrenia, leaving the hospital AMA postoperatively, and going to the emergency room.  He states his mental status is much improved and he is trying to avoid cocaine use.  He did get a prescription for pain pills but has not gotten them filled.  He states he does not need them at this point.  He is a little constipated but he is taking MiraLAX daily.  He denies any nausea or vomiting. Objective:    BP (!) 147/86   Pulse 71   Temp 98.6 F (37 C) (Oral)   Resp (!) 106   Ht 5\' 8"  (1.727 m)   Wt 133 lb (60.3 kg)   SpO2 98%   BMI 20.22 kg/m   General:  alert, cooperative and no distress  Abdomen soft, incision healing well.  Staples removed, Steri-Strips applied. Final pathology consistent with diagnosis.     Assessment:    He has recovered well from the surgical standpoint.    Plan:   Continue increase activity as able.  May return to work tomorrow.  Follow-up here as needed.

## 2020-05-31 NOTE — Discharge Summary (Signed)
Physician Discharge Summary  Patient ID: Brian Ford MRN: [id number] DOB/AGE: [id number] 54 y.o.  Admit date: 05/11/2020 Discharge date: 05/15/2020 Admission Diagnoses: Small bowel obstruction  Discharge Diagnoses: Same Active Problems:   Cocaine abuse (HCC)   Tobacco abuse   Hyperlipidemia   CAD (coronary artery disease)   Pulmonary emphysema (HCC)   S/P small bowel resection   Hypokalemia   Hypophosphatemia   Discharged Condition: fair  Hospital Course: Patient is a 54 year old white male with a history of cocaine use who presented to the emergency room with worsening abdominal and back pain.  He had been previously seen in the emergency room 24 hours earlier, but left AMA.  CT scan of the abdomen shows an internal hernia with evidence of ischemic changes in the small intestine.  The patient was taken emergently to the operating room on 05/11/2020 and underwent exploratory laparotomy, partial small bowel resection.  His postoperative course was remarkable for an episode of V. tach which went along with his history of coronary artery disease, but he had not been taking his medications including Brilinta.  While waiting for his bowel function to fully return, the patient decided to sign out AMA.  This was on 05/15/2020.  Treatments: surgery: Exploratory laparotomy, partial small bowel resection on 05/11/2020  Discharge Exam: Blood pressure 129/90, pulse 79, temperature 98 F (36.7 C), temperature source Oral, resp. rate 20, height 5\' 8"  (1.727 m), weight 66 kg, SpO2 99 %. General appearance: Patient left AMA.  Disposition: Patient left AMA   Allergies as of 05/15/2020   No Known Allergies     Medication List    ASK your doctor about these medications   aspirin EC 81 MG tablet Take 1 tablet (81 mg total) by mouth daily.   atorvastatin 80 MG tablet Commonly known as: LIPITOR Take 1 tablet (80 mg total) by mouth daily at 6 PM.   carvedilol 3.125 MG tablet Commonly known  as: Coreg Take 1 tablet (3.125 mg total) by mouth 2 (two) times daily.   nitroGLYCERIN 0.4 MG SL tablet Commonly known as: NITROSTAT Place 1 tablet (0.4 mg total) under the tongue every 5 (five) minutes x 3 doses as needed for chest pain.   ticagrelor 90 MG Tabs tablet Commonly known as: BRILINTA Take 1 tablet (90 mg total) by mouth 2 (two) times daily.        Signed: 07/15/2020 05/31/2020, 10:33 AM

## 2021-02-03 ENCOUNTER — Encounter (HOSPITAL_COMMUNITY): Payer: Self-pay

## 2021-02-03 ENCOUNTER — Emergency Department (HOSPITAL_COMMUNITY)
Admission: EM | Admit: 2021-02-03 | Discharge: 2021-02-04 | Disposition: A | Discharge status: Home / Self Care | Payer: No Typology Code available for payment source | Attending: Emergency Medicine | Admitting: Emergency Medicine

## 2021-02-03 DIAGNOSIS — I10 Essential (primary) hypertension: Secondary | ICD-10-CM | POA: Insufficient documentation

## 2021-02-03 DIAGNOSIS — F17210 Nicotine dependence, cigarettes, uncomplicated: Secondary | ICD-10-CM | POA: Diagnosis not present

## 2021-02-03 DIAGNOSIS — I2510 Atherosclerotic heart disease of native coronary artery without angina pectoris: Secondary | ICD-10-CM | POA: Diagnosis not present

## 2021-02-03 DIAGNOSIS — R5383 Other fatigue: Secondary | ICD-10-CM | POA: Insufficient documentation

## 2021-02-03 DIAGNOSIS — R0789 Other chest pain: Secondary | ICD-10-CM | POA: Insufficient documentation

## 2021-02-03 DIAGNOSIS — Z955 Presence of coronary angioplasty implant and graft: Secondary | ICD-10-CM | POA: Insufficient documentation

## 2021-02-03 DIAGNOSIS — Z79899 Other long term (current) drug therapy: Secondary | ICD-10-CM | POA: Insufficient documentation

## 2021-02-03 DIAGNOSIS — M542 Cervicalgia: Secondary | ICD-10-CM | POA: Diagnosis not present

## 2021-02-03 DIAGNOSIS — Z7982 Long term (current) use of aspirin: Secondary | ICD-10-CM | POA: Diagnosis not present

## 2021-02-03 DIAGNOSIS — Z7902 Long term (current) use of antithrombotics/antiplatelets: Secondary | ICD-10-CM | POA: Insufficient documentation

## 2021-02-03 DIAGNOSIS — Y92410 Unspecified street and highway as the place of occurrence of the external cause: Secondary | ICD-10-CM | POA: Diagnosis not present

## 2021-02-03 LAB — CBG MONITORING, ED: Glucose-Capillary: 174 mg/dL — ABNORMAL HIGH (ref 70–99)
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
## 2021-02-03 NOTE — ED Triage Notes (Signed)
Per EMS pt was ambulatory outside of the vehicle upon their arrival. Pt with slurred speech and is drowsy unable to stay awake. However pt denied any substance use or alcohol use.

## 2021-02-03 NOTE — ED Notes (Signed)
Pt is having difficulty staying awake during questions. When asked if he is tired pt states "I am. I have sugar problems and when I get excited, afterward I get really tired like this". Pt denies any alcohol or substance use tonight. Speech is slurred and pt is alert and oriented however drowsy.

## 2021-02-04 ENCOUNTER — Emergency Department (HOSPITAL_COMMUNITY): Payer: No Typology Code available for payment source

## 2021-02-04 LAB — ETHANOL: Alcohol, Ethyl (B): <10 mg/dL (ref <10)

## 2021-02-04 NOTE — ED Notes (Signed)
Patient transported to X-ray 
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
## 2021-08-05 DEATH — deceased
